# Patient Record
Sex: Male | Born: 1940 | Race: White | Hispanic: No | Marital: Married | Smoking: Never smoker
Health system: Southern US, Community
[De-identification: ages and names within clinical notes are randomized; demographics above are authoritative.]

## PROBLEM LIST (undated history)

## (undated) DIAGNOSIS — Z8719 Personal history of other diseases of the digestive system: Secondary | ICD-10-CM

## (undated) DIAGNOSIS — B958 Unspecified staphylococcus as the cause of diseases classified elsewhere: Secondary | ICD-10-CM

## (undated) DIAGNOSIS — H9190 Unspecified hearing loss, unspecified ear: Secondary | ICD-10-CM

## (undated) DIAGNOSIS — J189 Pneumonia, unspecified organism: Secondary | ICD-10-CM

## (undated) DIAGNOSIS — Z9289 Personal history of other medical treatment: Secondary | ICD-10-CM

## (undated) DIAGNOSIS — K648 Other hemorrhoids: Secondary | ICD-10-CM

## (undated) DIAGNOSIS — C4330 Malignant melanoma of unspecified part of face: Secondary | ICD-10-CM

## (undated) DIAGNOSIS — I219 Acute myocardial infarction, unspecified: Secondary | ICD-10-CM

## (undated) DIAGNOSIS — D649 Anemia, unspecified: Secondary | ICD-10-CM

## (undated) DIAGNOSIS — I209 Angina pectoris, unspecified: Secondary | ICD-10-CM

## (undated) DIAGNOSIS — E785 Hyperlipidemia, unspecified: Secondary | ICD-10-CM

## (undated) DIAGNOSIS — Z951 Presence of aortocoronary bypass graft: Secondary | ICD-10-CM

## (undated) DIAGNOSIS — I2510 Atherosclerotic heart disease of native coronary artery without angina pectoris: Secondary | ICD-10-CM

## (undated) DIAGNOSIS — M1990 Unspecified osteoarthritis, unspecified site: Secondary | ICD-10-CM

## (undated) HISTORY — PX: TONSILLECTOMY AND ADENOIDECTOMY: SUR1326

## (undated) HISTORY — PX: CATARACT EXTRACTION, BILATERAL: SHX1313

## (undated) HISTORY — DX: Presence of aortocoronary bypass graft: Z95.1

## (undated) HISTORY — PX: EYE SURGERY: SHX253

## (undated) HISTORY — DX: Hyperlipidemia, unspecified: E78.5

---

## 2007-06-17 DIAGNOSIS — C4330 Malignant melanoma of unspecified part of face: Secondary | ICD-10-CM

## 2007-06-17 HISTORY — PX: MELANOMA EXCISION: SHX5266

## 2007-06-17 HISTORY — DX: Malignant melanoma of unspecified part of face: C43.30

## 2009-09-28 ENCOUNTER — Ambulatory Visit (HOSPITAL_COMMUNITY): Admission: RE | Admit: 2009-09-28 | Discharge: 2009-09-28 | Payer: Self-pay | Admitting: Gastroenterology

## 2010-03-12 ENCOUNTER — Ambulatory Visit (HOSPITAL_COMMUNITY): Admission: RE | Admit: 2010-03-12 | Discharge: 2010-03-14 | Payer: Self-pay | Admitting: Gastroenterology

## 2010-03-13 ENCOUNTER — Encounter (INDEPENDENT_AMBULATORY_CARE_PROVIDER_SITE_OTHER): Payer: Self-pay | Admitting: Gastroenterology

## 2010-03-30 ENCOUNTER — Emergency Department (HOSPITAL_COMMUNITY): Admission: EM | Admit: 2010-03-30 | Discharge: 2010-03-30 | Payer: Self-pay | Admitting: Emergency Medicine

## 2010-06-16 DIAGNOSIS — K648 Other hemorrhoids: Secondary | ICD-10-CM

## 2010-06-16 DIAGNOSIS — Z9289 Personal history of other medical treatment: Secondary | ICD-10-CM

## 2010-06-16 HISTORY — DX: Personal history of other medical treatment: Z92.89

## 2010-06-16 HISTORY — DX: Other hemorrhoids: K64.8

## 2010-06-16 HISTORY — PX: EXCISIONAL HEMORRHOIDECTOMY: SHX1541

## 2010-08-28 LAB — POCT I-STAT, CHEM 8
BUN: 11 mg/dL (ref 6–23)
Calcium, Ion: 1.19 mmol/L (ref 1.12–1.32)
Chloride: 106 mEq/L (ref 96–112)
Creatinine, Ser: 0.7 mg/dL (ref 0.4–1.5)
Glucose, Bld: 105 mg/dL — ABNORMAL HIGH (ref 70–99)
HCT: 34 % — ABNORMAL LOW (ref 39.0–52.0)
Hemoglobin: 11.6 g/dL — ABNORMAL LOW (ref 13.0–17.0)
Potassium: 4 mEq/L (ref 3.5–5.1)
Sodium: 140 mEq/L (ref 135–145)
TCO2: 24 mmol/L (ref 0–100)

## 2010-08-29 LAB — CBC
HCT: 23.5 % — ABNORMAL LOW (ref 39.0–52.0)
HCT: 26.2 % — ABNORMAL LOW (ref 39.0–52.0)
HCT: 26.9 % — ABNORMAL LOW (ref 39.0–52.0)
Hemoglobin: 7.2 g/dL — ABNORMAL LOW (ref 13.0–17.0)
Hemoglobin: 8.1 g/dL — ABNORMAL LOW (ref 13.0–17.0)
Hemoglobin: 8.4 g/dL — ABNORMAL LOW (ref 13.0–17.0)
MCH: 19.4 pg — ABNORMAL LOW (ref 26.0–34.0)
MCH: 21.7 pg — ABNORMAL LOW (ref 26.0–34.0)
MCH: 21.9 pg — ABNORMAL LOW (ref 26.0–34.0)
MCHC: 30.6 g/dL (ref 30.0–36.0)
MCHC: 31.1 g/dL (ref 30.0–36.0)
MCHC: 31.1 g/dL (ref 30.0–36.0)
MCV: 63.3 fL — ABNORMAL LOW (ref 78.0–100.0)
MCV: 69.8 fL — ABNORMAL LOW (ref 78.0–100.0)
MCV: 70.4 fL — ABNORMAL LOW (ref 78.0–100.0)
Platelets: 323 10*3/uL (ref 150–400)
Platelets: 331 10*3/uL (ref 150–400)
Platelets: 428 10*3/uL — ABNORMAL HIGH (ref 150–400)
RBC: 3.72 MIL/uL — ABNORMAL LOW (ref 4.22–5.81)
RBC: 3.75 MIL/uL — ABNORMAL LOW (ref 4.22–5.81)
RBC: 3.83 MIL/uL — ABNORMAL LOW (ref 4.22–5.81)
RDW: 20.1 % — ABNORMAL HIGH (ref 11.5–15.5)
RDW: 25 % — ABNORMAL HIGH (ref 11.5–15.5)
RDW: 25.2 % — ABNORMAL HIGH (ref 11.5–15.5)
WBC: 5.8 10*3/uL (ref 4.0–10.5)
WBC: 6.9 10*3/uL (ref 4.0–10.5)
WBC: 9.2 10*3/uL (ref 4.0–10.5)

## 2010-08-29 LAB — TYPE AND SCREEN
ABO/RH(D): O POS
Antibody Screen: NEGATIVE

## 2010-08-29 LAB — BASIC METABOLIC PANEL
BUN: 11 mg/dL (ref 6–23)
CO2: 26 mEq/L (ref 19–32)
Calcium: 8.5 mg/dL (ref 8.4–10.5)
Chloride: 111 mEq/L (ref 96–112)
Creatinine, Ser: 0.82 mg/dL (ref 0.4–1.5)
GFR calc Af Amer: >60 mL/min (ref >60)
GFR calc non Af Amer: >60 mL/min (ref >60)
Glucose, Bld: 104 mg/dL — ABNORMAL HIGH (ref 70–99)
Potassium: 4.4 mEq/L (ref 3.5–5.1)
Sodium: 141 mEq/L (ref 135–145)

## 2010-08-29 LAB — HEMOGLOBIN AND HEMATOCRIT, BLOOD
HCT: 27.3 % — ABNORMAL LOW (ref 39.0–52.0)
HCT: 27.4 % — ABNORMAL LOW (ref 39.0–52.0)
Hemoglobin: 8.6 g/dL — ABNORMAL LOW (ref 13.0–17.0)
Hemoglobin: 8.7 g/dL — ABNORMAL LOW (ref 13.0–17.0)

## 2010-08-29 LAB — PREPARE RBC (CROSSMATCH)

## 2010-08-29 LAB — PROTIME-INR
INR: 1.2 (ref 0.00–1.49)
Prothrombin Time: 15.4 seconds — ABNORMAL HIGH (ref 11.6–15.2)

## 2010-09-03 LAB — DIFFERENTIAL
Basophils Absolute: 0 10*3/uL (ref 0.0–0.1)
Basophils Relative: 0 % (ref 0–1)
Eosinophils Absolute: 0 10*3/uL (ref 0.0–0.7)
Eosinophils Relative: 1 % (ref 0–5)
Lymphocytes Relative: 29 % (ref 12–46)
Lymphs Abs: 2 10*3/uL (ref 0.7–4.0)
Monocytes Absolute: 0.5 10*3/uL (ref 0.1–1.0)
Monocytes Relative: 8 % (ref 3–12)
Neutro Abs: 4.1 10*3/uL (ref 1.7–7.7)
Neutrophils Relative %: 62 % (ref 43–77)

## 2010-09-03 LAB — CBC
HCT: 27.5 % — ABNORMAL LOW (ref 39.0–52.0)
Hemoglobin: 8.6 g/dL — ABNORMAL LOW (ref 13.0–17.0)
MCHC: 31.2 g/dL (ref 30.0–36.0)
MCV: 72.3 fL — ABNORMAL LOW (ref 78.0–100.0)
Platelets: 355 10*3/uL (ref 150–400)
RBC: 3.8 MIL/uL — ABNORMAL LOW (ref 4.22–5.81)
RDW: 16.9 % — ABNORMAL HIGH (ref 11.5–15.5)
WBC: 6.7 10*3/uL (ref 4.0–10.5)

## 2010-09-03 LAB — TYPE AND SCREEN
ABO/RH(D): O POS
Antibody Screen: NEGATIVE

## 2010-09-03 LAB — ABO/RH: ABO/RH(D): O POS

## 2011-07-04 IMAGING — CR DG FOOT COMPLETE 3+V*L*
3 series · 3 of 3 positions shown · non-contrast
Comparison: None.

CLINICAL DATA: Intense left plantar foot and Achilles pain.

LEFT FOOT - COMPLETE 3+ VIEW

[view not recorded (1 of 3)]
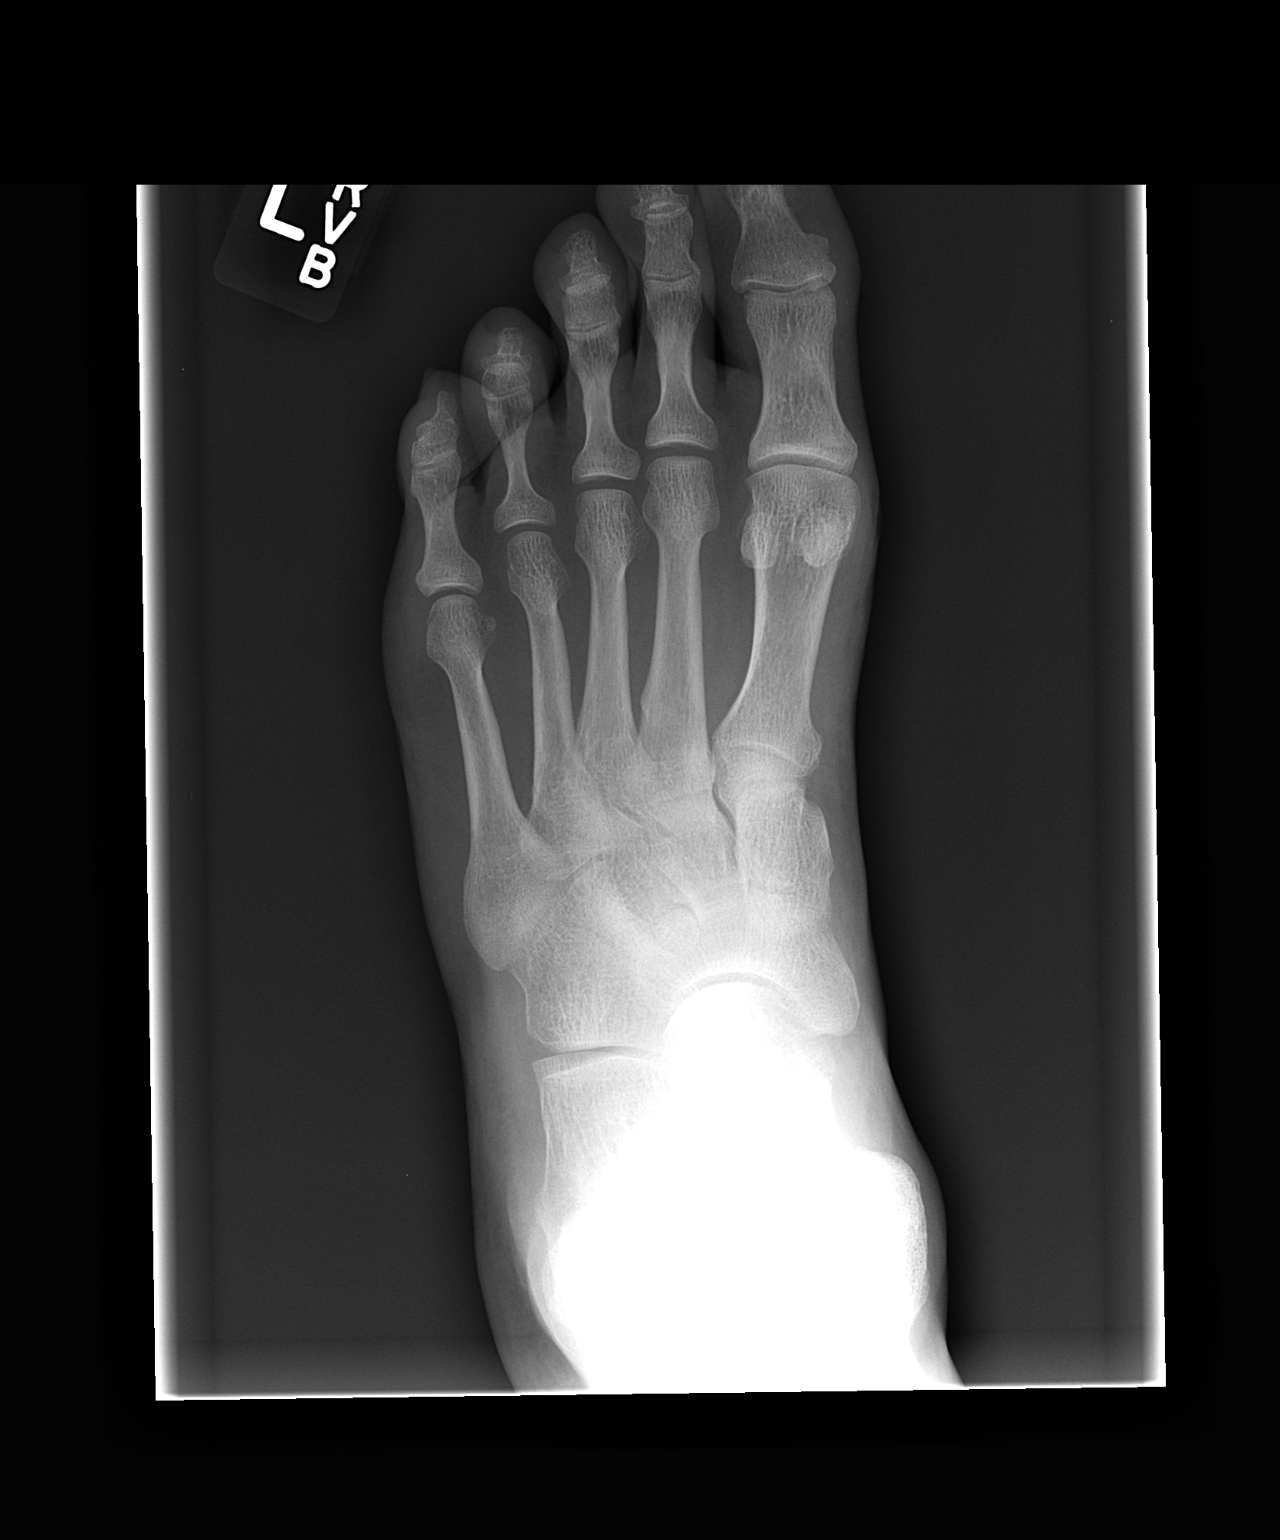

[view not recorded (2 of 3)]
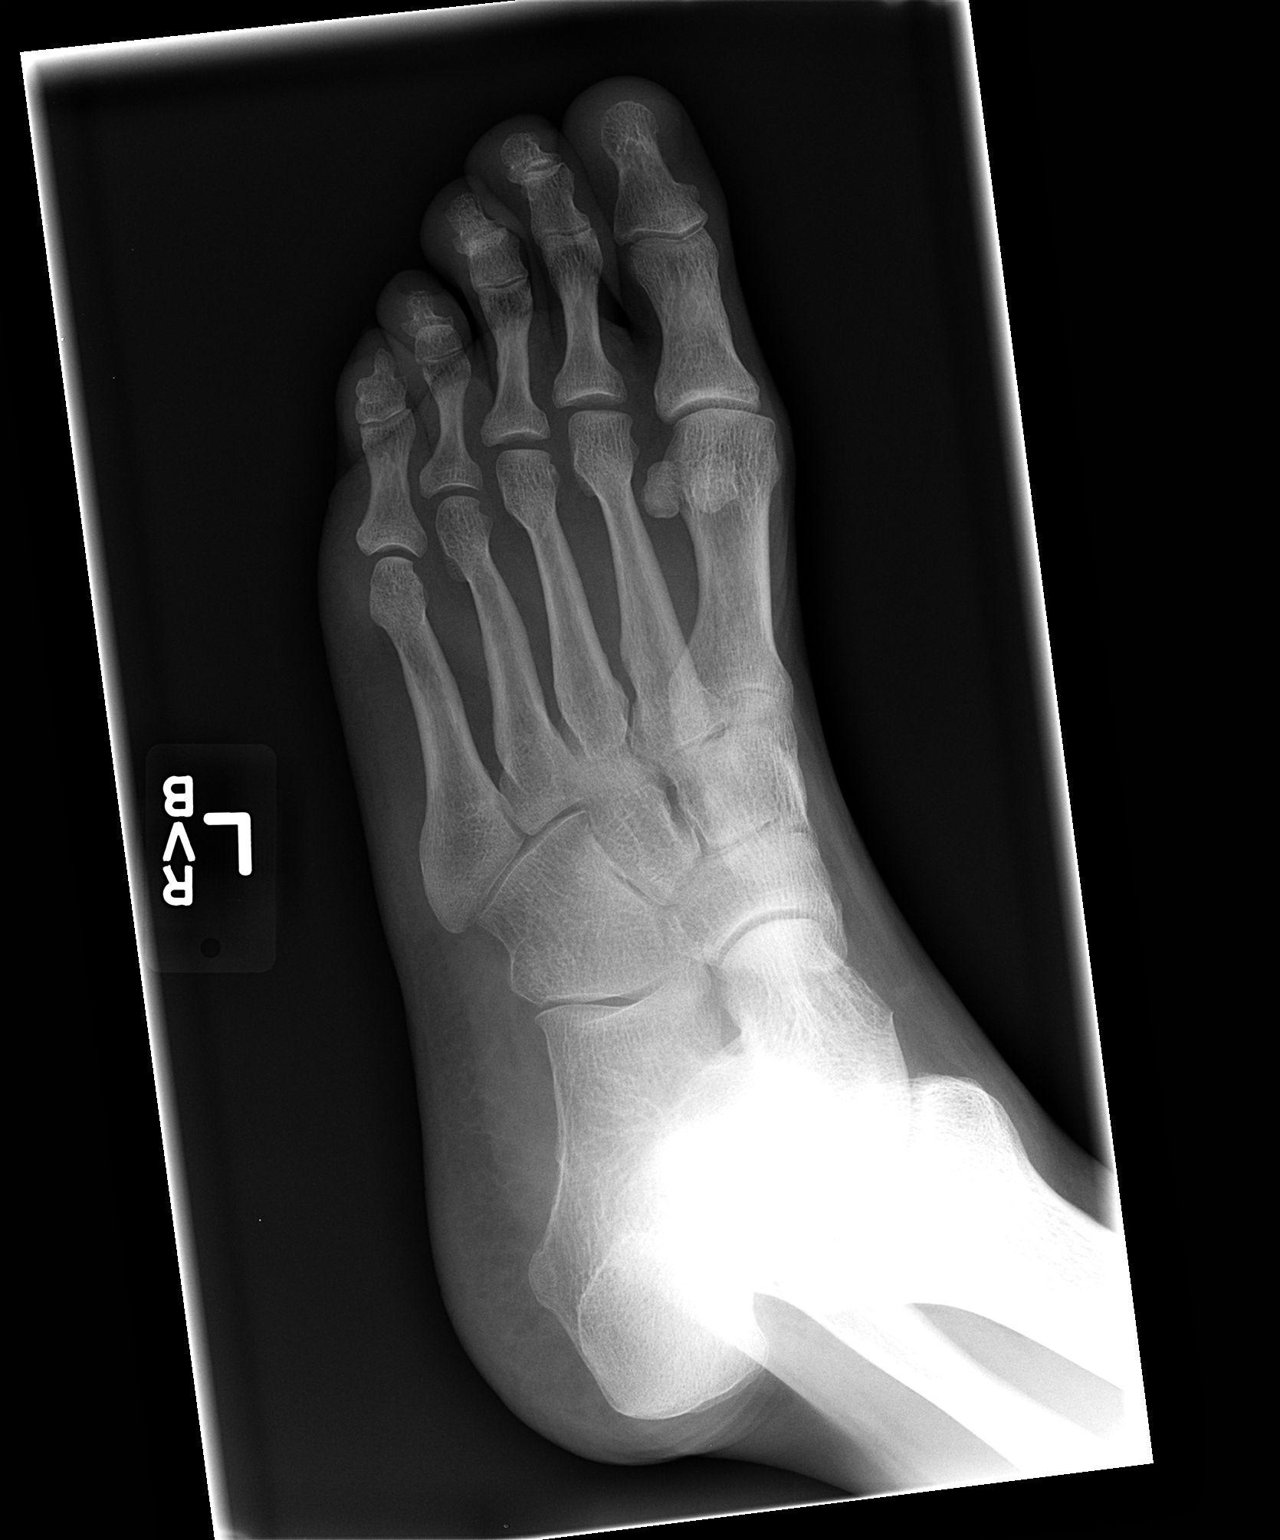

[view not recorded (3 of 3)]
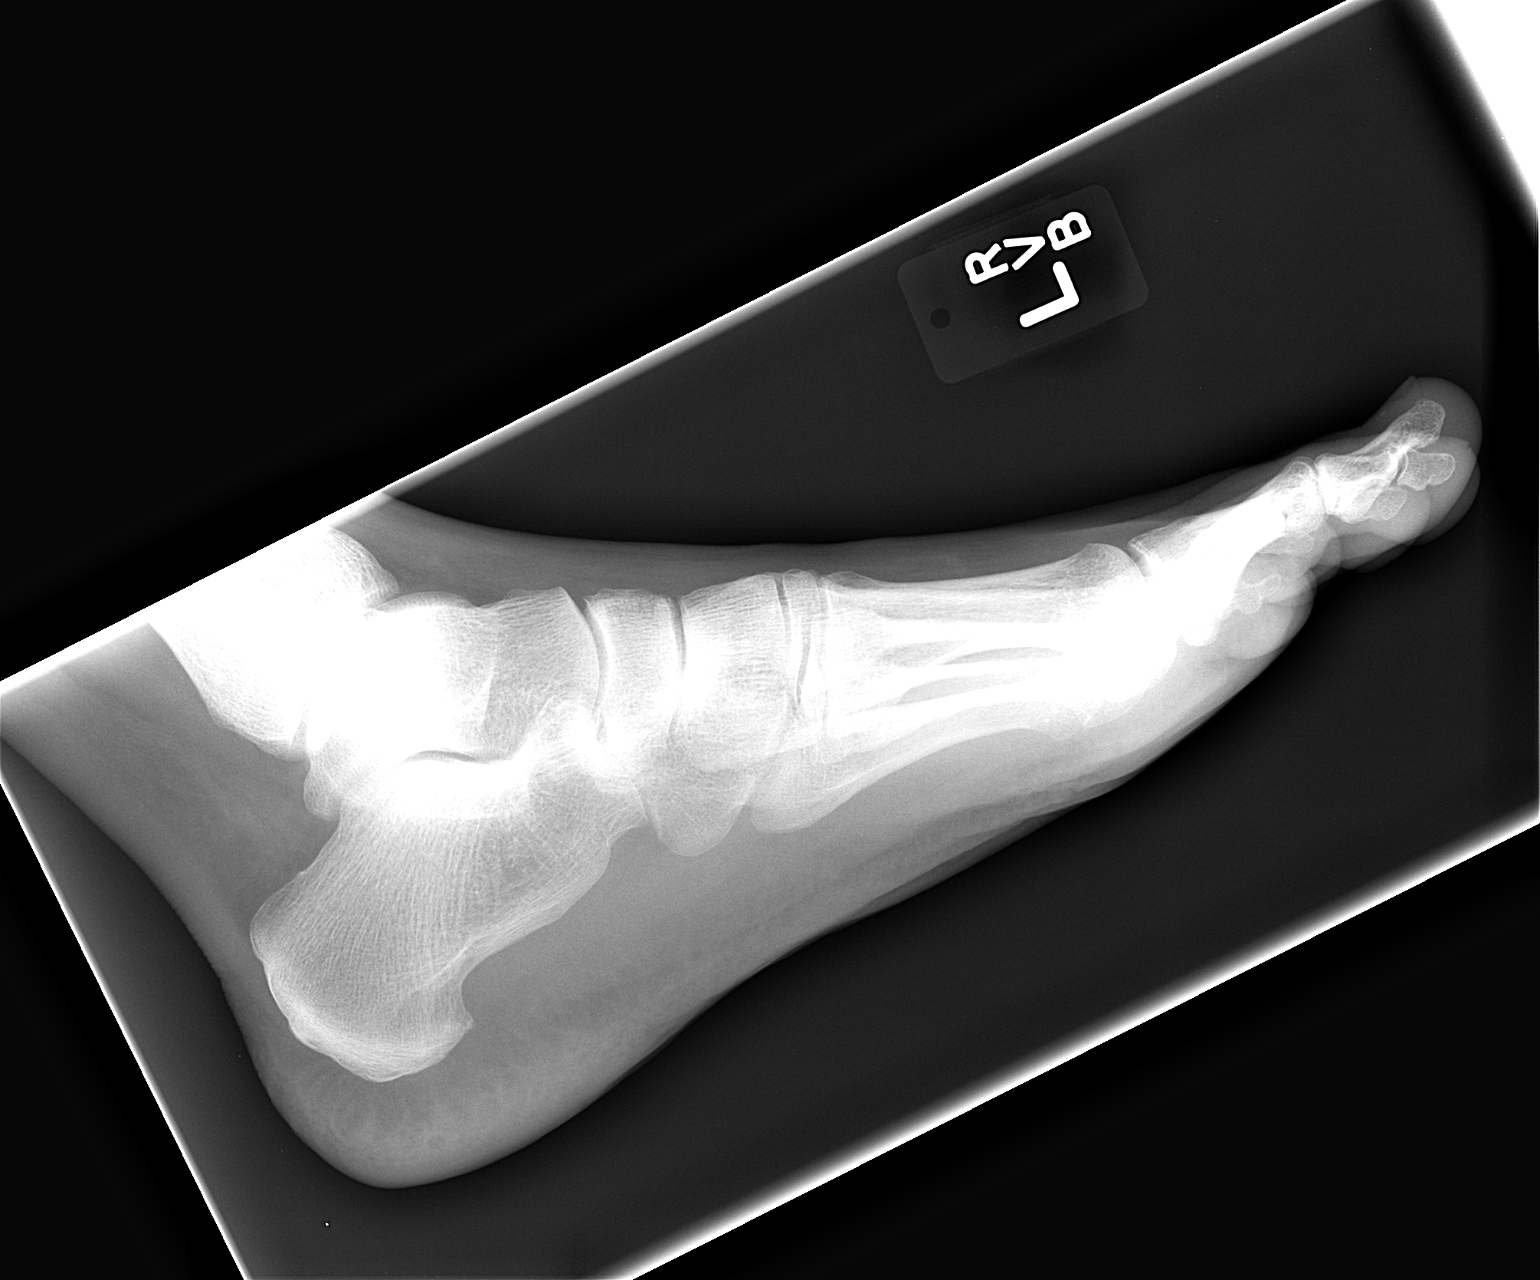

[3 of 3 positions shown; findings below may reference images not displayed]

FINDINGS: There is no evidence of fracture or dislocation.  The
joint spaces are preserved.  There is no evidence of talar
subluxation; the subtalar joint is unremarkable in appearance.

No significant soft tissue abnormalities are seen.
IMPRESSION: No evidence of fracture or dislocation.

## 2012-04-21 ENCOUNTER — Observation Stay (HOSPITAL_COMMUNITY)
Admission: EM | Admit: 2012-04-21 | Discharge: 2012-04-22 | Disposition: A | Discharge status: Home / Self Care | Payer: Medicare Other | Attending: Internal Medicine | Admitting: Internal Medicine

## 2012-04-21 ENCOUNTER — Emergency Department (HOSPITAL_COMMUNITY): Payer: Medicare Other

## 2012-04-21 ENCOUNTER — Encounter (HOSPITAL_COMMUNITY): Payer: Self-pay | Admitting: Emergency Medicine

## 2012-04-21 DIAGNOSIS — D649 Anemia, unspecified: Secondary | ICD-10-CM

## 2012-04-21 DIAGNOSIS — I219 Acute myocardial infarction, unspecified: Secondary | ICD-10-CM

## 2012-04-21 DIAGNOSIS — R079 Chest pain, unspecified: Principal | ICD-10-CM | POA: Insufficient documentation

## 2012-04-21 DIAGNOSIS — Z85820 Personal history of malignant melanoma of skin: Secondary | ICD-10-CM | POA: Insufficient documentation

## 2012-04-21 DIAGNOSIS — I209 Angina pectoris, unspecified: Secondary | ICD-10-CM

## 2012-04-21 DIAGNOSIS — D509 Iron deficiency anemia, unspecified: Secondary | ICD-10-CM | POA: Insufficient documentation

## 2012-04-21 HISTORY — PX: CARDIAC CATHETERIZATION: SHX172

## 2012-04-21 HISTORY — DX: Acute myocardial infarction, unspecified: I21.9

## 2012-04-21 HISTORY — DX: Angina pectoris, unspecified: I20.9

## 2012-04-21 LAB — CBC WITH DIFFERENTIAL/PLATELET
Basophils Absolute: 0 10*3/uL (ref 0.0–0.1)
Eosinophils Absolute: 0.2 10*3/uL (ref 0.0–0.7)
Eosinophils Relative: 2 % (ref 0–5)
HCT: 35.2 % — ABNORMAL LOW (ref 39.0–52.0)
Hemoglobin: 11.1 g/dL — ABNORMAL LOW (ref 13.0–17.0)
Lymphocytes Relative: 42 % (ref 12–46)
Lymphs Abs: 4.1 10*3/uL — ABNORMAL HIGH (ref 0.7–4.0)
MCH: 23.5 pg — ABNORMAL LOW (ref 26.0–34.0)
MCHC: 31.5 g/dL (ref 30.0–36.0)
MCV: 74.4 fL — ABNORMAL LOW (ref 78.0–100.0)
Monocytes Absolute: 0.9 10*3/uL (ref 0.1–1.0)
Monocytes Relative: 10 % (ref 3–12)
Neutro Abs: 4.6 10*3/uL (ref 1.7–7.7)
Neutrophils Relative %: 47 % (ref 43–77)
Platelets: 330 10*3/uL (ref 150–400)
RBC: 4.73 MIL/uL (ref 4.22–5.81)
RDW: 16.6 % — ABNORMAL HIGH (ref 11.5–15.5)
WBC: 9.9 10*3/uL (ref 4.0–10.5)

## 2012-04-21 LAB — COMPREHENSIVE METABOLIC PANEL
ALT: 37 U/L (ref 0–53)
AST: 36 U/L (ref 0–37)
Alkaline Phosphatase: 100 U/L (ref 39–117)
BUN: 14 mg/dL (ref 6–23)
CO2: 24 mEq/L (ref 19–32)
Calcium: 9.4 mg/dL (ref 8.4–10.5)
Chloride: 100 mEq/L (ref 96–112)
Creatinine, Ser: 0.89 mg/dL (ref 0.50–1.35)
GFR calc Af Amer: >90 mL/min (ref >90)
GFR calc non Af Amer: 85 mL/min — ABNORMAL LOW (ref >90)
Glucose, Bld: 123 mg/dL — ABNORMAL HIGH (ref 70–99)
Potassium: 3.8 mEq/L (ref 3.5–5.1)
Sodium: 139 mEq/L (ref 135–145)
Total Bilirubin: 0.2 mg/dL — ABNORMAL LOW (ref 0.3–1.2)
Total Protein: 7.1 g/dL (ref 6.0–8.3)

## 2012-04-21 LAB — POCT I-STAT TROPONIN I: Troponin i, poc: 0.02 ng/mL (ref 0.00–0.08)

## 2012-04-21 MED ORDER — ASPIRIN 81 MG PO CHEW
324.0000 mg | CHEWABLE_TABLET | Freq: Once | ORAL | Status: AC
  Administered 2012-04-21: 324 mg via ORAL
  Filled 2012-04-21: qty 4

## 2012-04-21 MED ORDER — NITROGLYCERIN 0.4 MG SL SUBL
SUBLINGUAL_TABLET | SUBLINGUAL | Status: AC
  Filled 2012-04-21: qty 25

## 2012-04-21 MED ORDER — NITROGLYCERIN 0.4 MG SL SUBL
0.4000 mg | SUBLINGUAL_TABLET | SUBLINGUAL | Status: DC | PRN
  Administered 2012-04-21: 23:00:00 via SUBLINGUAL

## 2012-04-21 NOTE — ED Provider Notes (Signed)
History     CSN: [id number]  Arrival date & time 04/21/12  2046   First MD Initiated Contact with Patient 04/21/12 2101      Chief Complaint  Patient presents with  . Chest Pain    (Consider location/radiation/quality/duration/timing/severity/associated sxs/prior treatment) HPI Patient developed anterior chest pressure 8 PM tonight accompanied by nausea shortness of breath and sweatiness. Symptoms improved spontaneously without treatment. Nothing makes symptoms better or worse. Symptoms onset at rest. No other associated symptoms. Symptoms almost gone at present. He denies shortness of breath nausea or sweatiness presently complains of minimal chest discomfort described as a pressure, nonradiating No past medical history on file. Melanoma Past Surgical History  Procedure Date  . Anus surgery     hemmrhoid removal  . Melanoma removal     on L side of head    No family history on file.  History  Substance Use Topics  . Smoking status: Never Smoker   . Smokeless tobacco: Not on file  . Alcohol Use: No   no illicit drug use    Review of Systems  Constitutional: Negative.   HENT: Negative.   Respiratory: Positive for shortness of breath.   Cardiovascular: Positive for chest pain.       Diaphoresis  Gastrointestinal: Positive for nausea.  Musculoskeletal: Negative.   Skin: Negative.   Neurological: Negative.   Hematological: Negative.   Psychiatric/Behavioral: Negative.   All other systems reviewed and are negative.    Allergies  Review of patient's allergies indicates no known allergies.  Home Medications   Current Outpatient Rx  Name  Route  Sig  Dispense  Refill  . COENZYME Q10 30 MG PO CAPS   Oral   Take 30 mg by mouth daily.         . ADULT MULTIVITAMIN W/MINERALS CH   Oral   Take 1 tablet by mouth daily.           BP 124/83  Pulse 77  Temp 97.4 F (36.3 C) (Oral)  Resp 22  SpO2 100%  Physical Exam  Nursing note and vitals  reviewed. Constitutional: He appears well-developed and well-nourished.  HENT:  Head: Normocephalic and atraumatic.  Eyes: Conjunctivae normal are normal. Pupils are equal, round, and reactive to light.  Neck: Neck supple. No tracheal deviation present. No thyromegaly present.  Cardiovascular: Normal rate and regular rhythm.   No murmur heard. Pulmonary/Chest: Effort normal and breath sounds normal.  Abdominal: Soft. Bowel sounds are normal. He exhibits no distension. There is no tenderness.  Musculoskeletal: Normal range of motion. He exhibits no edema and no tenderness.  Neurological: He is alert. Coordination normal.  Skin: Skin is warm and dry. No rash noted.  Psychiatric: He has a normal mood and affect.    ED Course  Procedures (including critical care time) 11:10 PM patient complains complain of mild anterior chest pressure. Sublingual nitroglycerin ordered.  Labs Reviewed  CBC WITH DIFFERENTIAL - Abnormal; Notable for the following:    Hemoglobin 11.1 (*)     HCT 35.2 (*)     MCV 74.4 (*)     MCH 23.5 (*)     RDW 16.6 (*)     Lymphs Abs 4.1 (*)     All other components within normal limits  COMPREHENSIVE METABOLIC PANEL - Abnormal; Notable for the following:    Glucose, Bld 123 (*)     Total Bilirubin 0.2 (*)     GFR calc non Af Amer 85 (*)  All other components within normal limits  POCT I-STAT TROPONIN I   No results found.   No diagnosis found.   Date: 04/21/2012  Rate: 75  Rhythm: normal sinus rhythm  QRS Axis: left  Intervals: normal  ST/T Wave abnormalities: nonspecific T wave changes  Conduction Disutrbances:none  Narrative Interpretation:   Old EKG Reviewed: No significant change from 03/13/2010 interpreted by me Results for orders placed during the hospital encounter of 04/21/12  CBC WITH DIFFERENTIAL      Component Value Range   WBC 9.9  4.0 - 10.5 K/uL   RBC 4.73  4.22 - 5.81 MIL/uL   Hemoglobin 11.1 (*) 13.0 - 17.0 g/dL   HCT 47.8 (*) 29.5  - 52.0 %   MCV 74.4 (*) 78.0 - 100.0 fL   MCH 23.5 (*) 26.0 - 34.0 pg   MCHC 31.5  30.0 - 36.0 g/dL   RDW 62.1 (*) 30.8 - 65.7 %   Platelets 330  150 - 400 K/uL   Neutrophils Relative 47  43 - 77 %   Neutro Abs 4.6  1.7 - 7.7 K/uL   Lymphocytes Relative 42  12 - 46 %   Lymphs Abs 4.1 (*) 0.7 - 4.0 K/uL   Monocytes Relative 10  3 - 12 %   Monocytes Absolute 0.9  0.1 - 1.0 K/uL   Eosinophils Relative 2  0 - 5 %   Eosinophils Absolute 0.2  0.0 - 0.7 K/uL   Basophils Relative 0  0 - 1 %   Basophils Absolute 0.0  0.0 - 0.1 K/uL  COMPREHENSIVE METABOLIC PANEL      Component Value Range   Sodium 139  135 - 145 mEq/L   Potassium 3.8  3.5 - 5.1 mEq/L   Chloride 100  96 - 112 mEq/L   CO2 24  19 - 32 mEq/L   Glucose, Bld 123 (*) 70 - 99 mg/dL   BUN 14  6 - 23 mg/dL   Creatinine, Ser 8.46  0.50 - 1.35 mg/dL   Calcium 9.4  8.4 - 96.2 mg/dL   Total Protein 7.1  6.0 - 8.3 g/dL   Albumin 3.9  3.5 - 5.2 g/dL   AST 36  0 - 37 U/L   ALT 37  0 - 53 U/L   Alkaline Phosphatase 100  39 - 117 U/L   Total Bilirubin 0.2 (*) 0.3 - 1.2 mg/dL   GFR calc non Af Amer 85 (*) >90 mL/min   GFR calc Af Amer >90  >90 mL/min  POCT I-STAT TROPONIN I      Component Value Range   Troponin i, poc 0.02  0.00 - 0.08 ng/mL   Comment 3            Dg Chest 2 View  04/21/2012  *RADIOLOGY REPORT*  Clinical Data: - Chest pain.  Syncope.  CHEST - 2 VIEW  Comparison: None.  Findings: Cardiomediastinal silhouette unremarkable.   Lungs clear. Bronchovascular markings normal.  Pulmonary vascularity normal.  No pneumothorax.  No pleural effusions.  Degenerative changes and DISH involving the thoracic spine.  IMPRESSION: No acute cardiopulmonary disease.   Original Report Authenticated By: Hulan Saas, M.D.     Chest x-ray reviewed by me  MDM  1110Dr Toniann Fail called to evaluate patient for admission or 23 hour observation Diagnosis chest pain        Doug Sou, MD 04/21/12 2315

## 2012-04-21 NOTE — ED Notes (Signed)
Pt was asleep at home when he woke up with chest tightness and SOB. 100% ra

## 2012-04-22 ENCOUNTER — Encounter (HOSPITAL_COMMUNITY)
Admission: EM | Disposition: A | Discharge status: Home / Self Care | Payer: Self-pay | Source: Home / Self Care | Attending: Emergency Medicine

## 2012-04-22 ENCOUNTER — Encounter (HOSPITAL_COMMUNITY): Payer: Self-pay | Admitting: Internal Medicine

## 2012-04-22 DIAGNOSIS — D509 Iron deficiency anemia, unspecified: Secondary | ICD-10-CM | POA: Diagnosis present

## 2012-04-22 DIAGNOSIS — R079 Chest pain, unspecified: Secondary | ICD-10-CM

## 2012-04-22 DIAGNOSIS — D649 Anemia, unspecified: Secondary | ICD-10-CM

## 2012-04-22 LAB — CBC WITH DIFFERENTIAL/PLATELET
Basophils Relative: 0 % (ref 0–1)
Eosinophils Absolute: 0 10*3/uL (ref 0.0–0.7)
Eosinophils Relative: 1 % (ref 0–5)
Lymphs Abs: 2.2 10*3/uL (ref 0.7–4.0)
MCH: 22.5 pg — ABNORMAL LOW (ref 26.0–34.0)
MCHC: 29.8 g/dL — ABNORMAL LOW (ref 30.0–36.0)
MCV: 75.5 fL — ABNORMAL LOW (ref 78.0–100.0)
Platelets: 297 10*3/uL (ref 150–400)
RBC: 4.58 MIL/uL (ref 4.22–5.81)

## 2012-04-22 LAB — COMPREHENSIVE METABOLIC PANEL
ALT: 32 U/L (ref 0–53)
AST: 27 U/L (ref 0–37)
Albumin: 3.4 g/dL — ABNORMAL LOW (ref 3.5–5.2)
Alkaline Phosphatase: 89 U/L (ref 39–117)
CO2: 27 mEq/L (ref 19–32)
Chloride: 102 mEq/L (ref 96–112)
GFR calc non Af Amer: 86 mL/min — ABNORMAL LOW (ref >90)
Potassium: 4 mEq/L (ref 3.5–5.1)
Sodium: 138 mEq/L (ref 135–145)
Total Bilirubin: 0.3 mg/dL (ref 0.3–1.2)

## 2012-04-22 LAB — GLUCOSE, CAPILLARY: Glucose-Capillary: 83 mg/dL (ref 70–99)

## 2012-04-22 LAB — TROPONIN I
Troponin I: <0.3 ng/mL (ref <0.30)
Troponin I: <0.3 ng/mL (ref <0.30)

## 2012-04-22 LAB — VITAMIN B12: Vitamin B-12: 756 pg/mL (ref 211–911)

## 2012-04-22 LAB — IRON AND TIBC
Iron: 16 ug/dL — ABNORMAL LOW (ref 42–135)
Saturation Ratios: 4 % — ABNORMAL LOW (ref 20–55)
UIBC: 363 ug/dL (ref 125–400)

## 2012-04-22 LAB — LIPID PANEL
Cholesterol: 166 mg/dL (ref 0–200)
Total CHOL/HDL Ratio: 3.6 RATIO
Triglycerides: 103 mg/dL (ref <150)

## 2012-04-22 LAB — FERRITIN: Ferritin: 6 ng/mL — ABNORMAL LOW (ref 22–322)

## 2012-04-22 LAB — TSH: TSH: 3.011 u[IU]/mL (ref 0.350–4.500)

## 2012-04-22 SURGERY — LEFT HEART CATHETERIZATION WITH CORONARY ANGIOGRAM
Anesthesia: LOCAL

## 2012-04-22 MED ORDER — SODIUM CHLORIDE 0.9 % IV SOLN
INTRAVENOUS | Status: DC
  Administered 2012-04-22: 01:00:00 via INTRAVENOUS

## 2012-04-22 MED ORDER — ONDANSETRON HCL 4 MG PO TABS: 4.0000 mg | ORAL_TABLET | Freq: Four times a day (QID) | ORAL | Status: DC | PRN

## 2012-04-22 MED ORDER — BIOTENE DRY MOUTH MT LIQD
15.0000 mL | Freq: Two times a day (BID) | OROMUCOSAL | Status: DC
  Administered 2012-04-22: 15 mL via OROMUCOSAL

## 2012-04-22 MED ORDER — MORPHINE SULFATE 2 MG/ML IJ SOLN: 1.0000 mg | INTRAMUSCULAR | Status: DC | PRN

## 2012-04-22 MED ORDER — ASPIRIN EC 325 MG PO TBEC
325.0000 mg | DELAYED_RELEASE_TABLET | Freq: Every day | ORAL | Status: DC
  Administered 2012-04-22: 325 mg via ORAL
  Filled 2012-04-22: qty 1

## 2012-04-22 MED ORDER — SODIUM CHLORIDE 0.9 % IJ SOLN: 3.0000 mL | Freq: Two times a day (BID) | INTRAMUSCULAR | Status: DC

## 2012-04-22 MED ORDER — NITROGLYCERIN IN D5W 200-5 MCG/ML-% IV SOLN
2.0000 ug/min | INTRAVENOUS | Status: DC
  Administered 2012-04-22: 5 ug/min via INTRAVENOUS
  Filled 2012-04-22: qty 250

## 2012-04-22 MED ORDER — ENOXAPARIN SODIUM 40 MG/0.4ML ~~LOC~~ SOLN
40.0000 mg | SUBCUTANEOUS | Status: DC
  Administered 2012-04-22: 40 mg via SUBCUTANEOUS
  Filled 2012-04-22: qty 0.4

## 2012-04-22 MED ORDER — FERROUS SULFATE 325 (65 FE) MG PO TABS: 325.0000 mg | ORAL_TABLET | Freq: Two times a day (BID) | ORAL | Status: DC

## 2012-04-22 MED ORDER — FERROUS SULFATE 325 (65 FE) MG PO TABS
325.0000 mg | ORAL_TABLET | Freq: Two times a day (BID) | ORAL | Status: DC
  Filled 2012-04-22 (×2): qty 1

## 2012-04-22 MED ORDER — ACETAMINOPHEN 325 MG PO TABS: 650.0000 mg | ORAL_TABLET | Freq: Four times a day (QID) | ORAL | Status: DC | PRN

## 2012-04-22 MED ORDER — ATORVASTATIN CALCIUM 20 MG PO TABS
20.0000 mg | ORAL_TABLET | Freq: Every day | ORAL | Status: DC
  Filled 2012-04-22: qty 1

## 2012-04-22 MED ORDER — ACETAMINOPHEN 650 MG RE SUPP: 650.0000 mg | Freq: Four times a day (QID) | RECTAL | Status: DC | PRN

## 2012-04-22 MED ORDER — ONDANSETRON HCL 4 MG/2ML IJ SOLN: 4.0000 mg | Freq: Four times a day (QID) | INTRAMUSCULAR | Status: DC | PRN

## 2012-04-22 MED ORDER — ASPIRIN EC 81 MG PO TBEC: 81.0000 mg | DELAYED_RELEASE_TABLET | Freq: Every day | ORAL | Status: DC

## 2012-04-22 NOTE — Consult Note (Addendum)
Admit date: 04/21/2012 Referring Physician  Dr. Ardyth Harps, [facility] Primary Physician  Lupe Carney, MD Primary Cardiologist  HWBSmith, III Reason for Consultation:  Chest pain  ASSESSMENT: 1. Prolonged chest pain with MI ruled out. History is concerning, but there are no high risk indicators(- for DM, Htn, tobacco, family history, etc.). 2. History of peptic ulcer disease. 3. Hemorrhoids 4.Scalp melenoma  PLAN:  1. Offerred coronary angiography but decided in favor of stress cardiolite as an OP in AM at [facility] cardiology.  2. Asprin 81 mg daily  3. Return to the hospital if recurrent chest pain.   HPI: Very nice 71 yo with a history of colon polyps, hemorrhoids and peptic ulcer disease was admitted with chest pressure starting at rest. He mowed his lawn yesterday afternoon. No symptoms during the physical activity. The discomfort was new and never previously experienced. Feels well now. Markers and ECG'sare stable. He has been able to ambulate briskly before and after eating without recurrence of chest discomfort, dyspnea, or any cardiac complaints.    PMH:  History reviewed. No pertinent past medical history.   PSH:   Past Surgical History  Procedure Date  . Anus surgery     hemmrhoid removal  . Melanoma removal     on L side of head    Allergies:  Review of patient's allergies indicates no known allergies. Prior to Admit Meds:   Prescriptions prior to admission  Medication Sig Dispense Refill  . co-enzyme Q-10 30 MG capsule Take 30 mg by mouth daily.      . Multiple Vitamin (MULTIVITAMIN WITH MINERALS) TABS Take 1 tablet by mouth daily.       Fam HX:    Family History  Problem Relation Age of Onset  . Diabetes Mellitus II Mother    Social HX:    History   Social History  . Marital Status: Married    Spouse Name: N/A    Number of Children: N/A  . Years of Education: N/A   Occupational History  . Not on file.   Social History Main Topics  .  Smoking status: Never Smoker   . Smokeless tobacco: Not on file  . Alcohol Use: No  . Drug Use: No  . Sexually Active:    Other Topics Concern  . Not on file   Social History Narrative  . No narrative on file     Review of Systems: Physically very active and no history of exertional intolerance or claudication.  Physical Exam: Blood pressure 137/74, pulse 73, temperature 97.8 F (36.6 C), temperature source Oral, resp. rate 20, height 5\' 6"  (1.676 m), weight 70.1 kg (154 lb 8.7 oz), SpO2 98.00%. Weight change:   The patient is normal in appearance. He has no obvious distress. Color is good.  Neck exam reveals no carotid bruits or JVD.  Lungs are clear to auscultation and percussion.  Cardiac exam is normal. No murmur rub or gallop is heard.  Abdomen soft. No tenderness or hepatomegaaly is noted.  Lower reveal no edema. Pulses 2+ and symmetric. Posterior tibial radial and femoral pulses are symmetric.  Neurological exam is unremarkable. No motor sensory deficits noted.  Labs:   Lab Results  Component Value Date   WBC 7.7 04/22/2012   HGB 10.3* 04/22/2012   HCT 34.6* 04/22/2012   MCV 75.5* 04/22/2012   PLT 297 04/22/2012    Lab 04/22/12 0220  NA 138  K 4.0  CL 102  CO2 27  BUN 13  CREATININE 0.86  CALCIUM 9.0  PROT 6.3  BILITOT 0.3  ALKPHOS 89  ALT 32  AST 27  GLUCOSE 102*   No results found for this basename: PTT   Lab Results  Component Value Date   INR 1.20 03/12/2010   Lab Results  Component Value Date   TROPONINI <0.30 04/22/2012     Lab Results  Component Value Date   CHOL 166 04/22/2012   Lab Results  Component Value Date   HDL 46 04/22/2012   Lab Results  Component Value Date   LDLCALC 99 04/22/2012   Lab Results  Component Value Date   TRIG 103 04/22/2012   Lab Results  Component Value Date   CHOLHDL 3.6 04/22/2012   No results found for this basename: LDLDIRECT      Radiology:  Dg Chest 2 View  04/21/2012  *RADIOLOGY REPORT*   Clinical Data: - Chest pain.  Syncope.  CHEST - 2 VIEW  Comparison: None.  Findings: Cardiomediastinal silhouette unremarkable.   Lungs clear. Bronchovascular markings normal.  Pulmonary vascularity normal.  No pneumothorax.  No pleural effusions.  Degenerative changes and DISH involving the thoracic spine.  IMPRESSION: No acute cardiopulmonary disease.   Original Report Authenticated By: Hulan Saas, M.D.    EKG:  No significant abnormalities are noted on serial tracings    Lesleigh Noe 04/22/2012 1:55 PM   ADDENDUM: His insurance company, [employer], would not certify an OP nuclear stress test. He will have a standard ETT tomorrow in our office at 11:15 AM. He wants to be discharged from the hospital.

## 2012-04-22 NOTE — Care Management Note (Signed)
    Page 1 of 1   04/23/2012     10:03:40 AM   CARE MANAGEMENT NOTE 04/23/2012  Patient:  Keith Cunningham, Keith Cunningham   Account Number:  [id number]  Date Initiated:  04/22/2012  Documentation initiated by:  Lanier Clam  Subjective/Objective Assessment:   ADMITTED W/CHEST PAIN.ZO:[id number]     Action/Plan:   FROM HOME W/SPOUSE.   Anticipated DC Date:  04/23/2012   Anticipated DC Plan:  HOME/SELF CARE      DC Planning Services  CM consult      Choice offered to / List presented to:             Status of service:  Completed, signed off Medicare Important Message given?   (If response is "NO", the following Medicare IM given date fields will be blank) Date Medicare IM given:   Date Additional Medicare IM given:    Discharge Disposition:  HOME/SELF CARE  Per UR Regulation:  Reviewed for med. necessity/level of care/duration of stay  If discussed at Long Length of Stay Meetings, dates discussed:    Comments:  04/22/12 [facility] Jamiel Goncalves RN,BSN NCM [phone]

## 2012-04-22 NOTE — H&P (Signed)
Keith Cunningham is an 71 y.o. male.   Patient was seen and examined on April 22, 2012. PCP - Dr. Theressa Millard. Chief Complaint: Chest pain. HPI: 71 year-old male with history of melanoma started developing some chest pressure last evening around 8 PM while at home. Patient initially felt dizzy and short of breath after which he went to the bathroom to have a bowel movement after which he started developing chest pressure felt nauseated and had diaphoresis. The chest pressure persisted and he decided to come to the ER. He was brought to the ER by his wife in a car probably patient threw up once. In the ER EKG and cardiac enzymes did not show any acute. Chest x-ray was unremarkable. Patient's chest pain improved with nitroglycerin sublingual was still persist and has been admitted for further management. Patient denies any fever chills or any productive cough.  History reviewed. No pertinent past medical history.  Past Surgical History  Procedure Date  . Anus surgery     hemmrhoid removal  . Melanoma removal     on L side of head    Family History  Problem Relation Age of Onset  . Diabetes Mellitus II Mother    Social History:  reports that he has never smoked. He does not have any smokeless tobacco history on file. He reports that he does not drink alcohol or use illicit drugs.  Allergies: No Known Allergies   (Not in a hospital admission)  Results for orders placed during the hospital encounter of 04/21/12 (from the past 48 hour(s))  CBC WITH DIFFERENTIAL     Status: Abnormal   Collection Time   04/21/12  8:50 PM      Component Value Range Comment   WBC 9.9  4.0 - 10.5 K/uL    RBC 4.73  4.22 - 5.81 MIL/uL    Hemoglobin 11.1 (*) 13.0 - 17.0 g/dL    HCT 62.9 (*) 52.8 - 52.0 %    MCV 74.4 (*) 78.0 - 100.0 fL    MCH 23.5 (*) 26.0 - 34.0 pg    MCHC 31.5  30.0 - 36.0 g/dL    RDW 41.3 (*) 24.4 - 15.5 %    Platelets 330  150 - 400 K/uL    Neutrophils Relative 47  43 - 77 %    Neutro Abs 4.6  1.7 - 7.7 K/uL    Lymphocytes Relative 42  12 - 46 %    Lymphs Abs 4.1 (*) 0.7 - 4.0 K/uL    Monocytes Relative 10  3 - 12 %    Monocytes Absolute 0.9  0.1 - 1.0 K/uL    Eosinophils Relative 2  0 - 5 %    Eosinophils Absolute 0.2  0.0 - 0.7 K/uL    Basophils Relative 0  0 - 1 %    Basophils Absolute 0.0  0.0 - 0.1 K/uL   COMPREHENSIVE METABOLIC PANEL     Status: Abnormal   Collection Time   04/21/12  8:50 PM      Component Value Range Comment   Sodium 139  135 - 145 mEq/L    Potassium 3.8  3.5 - 5.1 mEq/L    Chloride 100  96 - 112 mEq/L    CO2 24  19 - 32 mEq/L    Glucose, Bld 123 (*) 70 - 99 mg/dL    BUN 14  6 - 23 mg/dL    Creatinine, Ser 0.10  0.50 - 1.35 mg/dL  Calcium 9.4  8.4 - 10.5 mg/dL    Total Protein 7.1  6.0 - 8.3 g/dL    Albumin 3.9  3.5 - 5.2 g/dL    AST 36  0 - 37 U/L    ALT 37  0 - 53 U/L    Alkaline Phosphatase 100  39 - 117 U/L    Total Bilirubin 0.2 (*) 0.3 - 1.2 mg/dL    GFR calc non Af Amer 85 (*) >90 mL/min    GFR calc Af Amer >90  >90 mL/min   POCT I-STAT TROPONIN I     Status: Normal   Collection Time   04/21/12  9:02 PM      Component Value Range Comment   Troponin i, poc 0.02  0.00 - 0.08 ng/mL    Comment 3             Dg Chest 2 View  04/21/2012  *RADIOLOGY REPORT*  Clinical Data: - Chest pain.  Syncope.  CHEST - 2 VIEW  Comparison: None.  Findings: Cardiomediastinal silhouette unremarkable.   Lungs clear. Bronchovascular markings normal.  Pulmonary vascularity normal.  No pneumothorax.  No pleural effusions.  Degenerative changes and DISH involving the thoracic spine.  IMPRESSION: No acute cardiopulmonary disease.   Original Report Authenticated By: Hulan Saas, M.D.     Review of Systems  Constitutional: Negative.   HENT: Negative.   Eyes: Negative.   Respiratory: Negative.   Cardiovascular: Positive for chest pain.  Gastrointestinal: Positive for nausea.  Genitourinary: Negative.   Musculoskeletal: Negative.   Skin:  Negative.   Neurological: Negative.   Endo/Heme/Allergies: Negative.   Psychiatric/Behavioral: Negative.     Blood pressure 124/83, pulse 77, temperature 97.4 F (36.3 C), temperature source Oral, resp. rate 22, SpO2 100.00%. Physical Exam  Constitutional: He is oriented to person, place, and time. He appears well-developed and well-nourished. No distress.  HENT:  Head: Normocephalic and atraumatic.  Eyes: Conjunctivae normal are normal. Pupils are equal, round, and reactive to light. Right eye exhibits no discharge. Left eye exhibits no discharge. No scleral icterus.  Neck: Normal range of motion. Neck supple.  Cardiovascular: Normal rate and regular rhythm.   Respiratory: Effort normal and breath sounds normal. No respiratory distress. He has no wheezes. He has no rales.  GI: Soft. Bowel sounds are normal. He exhibits no distension. There is no tenderness. There is no rebound.  Musculoskeletal: He exhibits no edema and no tenderness.  Neurological: He is alert and oriented to person, place, and time.       Moves all extremities.  Skin: Skin is warm and dry. He is not diaphoretic.     Assessment/Plan #1. Chest pain concerning for unstable angina - patient has been admitted to step down unit. We'll titrate IV nitroglycerin infusion for chest pain. Aspirin. Cardiology has been consulted. Patient will be kept n.p.o. in anticipation of possible procedures. #2. Hypochromic microcytic anemia - patient states he has had a colonoscopy last of which was normal. Check anemia panel. If there is no significant fall in hemoglobin then further workup as outpatient.  Patient is admitted under Dr. Theressa Millard service.  CODE STATUS - full code.  Darielys Giglia N. 04/22/2012, 12:16 AM

## 2012-04-22 NOTE — Discharge Summary (Signed)
Physician Discharge Summary  Patient ID: Keith Cunningham MRN: [id number] DOB/AGE: [birth year] 71 y.o.  Admit date: 04/21/2012 Discharge date: 04/22/2012  Primary Care Physician:  Darnelle Bos, MD   Discharge Diagnoses:    Principal Problem:  *Chest pain Active Problems:  Iron deficiency anemia      Medication List     As of 04/22/2012  4:50 PM    TAKE these medications         aspirin EC 81 MG tablet   Take 1 tablet (81 mg total) by mouth daily.      co-enzyme Q-10 30 MG capsule   Take 30 mg by mouth daily.      ferrous sulfate 325 (65 FE) MG tablet   Take 1 tablet (325 mg total) by mouth 2 (two) times daily with a meal.      multivitamin with minerals Tabs   Take 1 tablet by mouth daily.          Disposition and Follow-up:  Will be discharged home today in stable and improved condition. Will follow up with Dr. Katrinka Blazing in the morning for a stress test. I have also advised he follow up with Dr. Earl Gala for workup of his iron deficiency anemia.  Consults:  Cardiology, Dr. Katrinka Blazing.   Significant Diagnostic Studies:  Dg Chest 2 View  04/21/2012  *RADIOLOGY REPORT*  Clinical Data: - Chest pain.  Syncope.  CHEST - 2 VIEW  Comparison: None.  Findings: Cardiomediastinal silhouette unremarkable.   Lungs clear. Bronchovascular markings normal.  Pulmonary vascularity normal.  No pneumothorax.  No pleural effusions.  Degenerative changes and DISH involving the thoracic spine.  IMPRESSION: No acute cardiopulmonary disease.   Original Report Authenticated By: Hulan Saas, M.D.     Brief H and P: For complete details please refer to admission H and P, but in brief patient is a 71 y/o man started developing some chest pressure last evening around 8 PM while at home. Patient initially felt dizzy and short of breath after which he went to the bathroom to have a bowel movement after which he started developing chest pressure felt nauseated and had diaphoresis. The chest  pressure persisted and he decided to come to the ER. He was brought to the ER by his wife in a car probably patient threw up once. In the ER EKG and cardiac enzymes did not show any acute. Chest x-ray was unremarkable. Patient's chest pain improved with nitroglycerin sublingual was still persist and has been admitted for further management. Patient denies any fever chills or any productive cough. We were asked to admit him for further evaluation and management.     Hospital Course:  Principal Problem:  *Chest pain Active Problems:  Iron deficiency anemia   Chest pain -Has r/o for ACS with 3 negative sets of enzymes and EKG without acute ischemic changes. -He does not have a lot of CAD risk factors other than age and sex. -His CP was concerning tho. -Has been evaluated by Dr. Katrinka Blazing with cardiology and the plan is to DC him home today and he will follow up with Dr. Katrinka Blazing in the am for an outpatient stress test, as he has declined a cardiac cath.  Iron Deficiency Anemia -Ferritin is 6. -Will place on BID ferrous sulfate at time of DC. -Had a colonoscopy 1 year ago with findings of only hemorrhoids for which he had surgery. -It is possible that bleeding from the hemorrhoids caused his iron deficiency. -No indication for transfusion at his  point with a Hb of 10. -Further workup can be accomplished as an outpatient.  Time spent on Discharge: Greater than 30 minutes.  SignedChaya Jan [facility] Pager: [phone] 04/22/2012, 4:50 PM

## 2012-04-22 NOTE — Progress Notes (Addendum)
Patient seen and examined and discussed with wife at bedside. Patient was admitted earlier today for CP. Was seen by cardiology overnight and has been kept NPO for a possible procedure. He has now ruled out with 3 negative troponins. He is CP free and we have asked for the nitro drip to be weaned. Have called [facility] to make sure patient is seen. If no further inpatient tests to be done, he can be discharged home later today. Note he also has iron deficiency anemia with a ferritin of 6. Start iron supplementation. States he has had a normal colonoscopy in the past. This can be further worked up as an outpatient.  Peggye Pitt, MD [facility] Pager: [phone]

## 2012-04-22 NOTE — Consult Note (Signed)
CARDIOLOGY CONSULT NOTE  Patient ID: Keith Cunningham, MRN: [id number], DOB/AGE: Nov 05, 1940 71 y.o. Admit date: 04/21/2012 Date of Consult: 04/22/2012  Primary Physician: [facility] Primary Cardiologist: none  Chief Complaint: Chest pain Reason for Consultation: concern for ACS  HPI: 71 y.o. male w/ no cardiac history or risk factors beyond gender and age who presents to [facility] with complaints of sudden onset of chest pressure. He reports that he was in his usual state of health today and was able to push mow his 1/2 acre yard without difficulty. After dinner around 8 pm while resting, he had sudden onset of lightheadedness and dyspnea. He got up to go have a BM at which time the lightheadedness worsened and he had chest pressure. Associated with diaphoresis. At that time, he sought ER care and was driven by his wife. During the drive over he felt nauseated and subsequently vomited. Nonbloody. His chest pressure peaked at 5-6 out of 10 and improved with a SL nitro to 1-2 though he felt acutely lightheaded and his SBP dropped into the 90s.   Denies history of chest pain previously. Tends to avoid going to the doctor per his wife. No history of CAD or stress testing. Active and in shape. Never limited by SOB or chest pain.   Currently reports chest pressure of 1/10. Dyspnea much improved. No diaphoresis.  No recent GI bleeding. No melena or coffee grounds. No hematuria.  No history of blood clot. No LE swelling.  Medical History 1. Hemorrhoidal bleed requiring blood xfusion 2. Remote history of PUD; EGD in 2011 normal per pt 3. Melanoma s/p resection  Surgical History:  Past Surgical History  Procedure Date  . Anus surgery     hemmrhoid removal  . Melanoma removal     on L side of head     Home Meds: Prior to Admission medications   Medication Sig Start Date End Date Taking? Authorizing Provider  co-enzyme Q-10 30 MG capsule Take 30 mg by mouth daily.   Yes Historical  Provider, MD  Multiple Vitamin (MULTIVITAMIN WITH MINERALS) TABS Take 1 tablet by mouth daily.   Yes Historical Provider, MD    Inpatient Medications:    . [COMPLETED] aspirin  324 mg Oral Once    Allergies: No Known Allergies  History   Social History  . Marital Status: Married    Spouse Name: N/A    Number of Children: N/A  . Years of Education: N/A   Occupational History  . Not on file.   Social History Main Topics  . Smoking status: Never Smoker   . Smokeless tobacco: Not on file  . Alcohol Use: No  . Drug Use: No  . Sexually Active:    Other Topics Concern  . Not on file   Social History Narrative  . No narrative on file     Family History  Problem Relation Age of Onset  . Diabetes Mellitus II Mother    CAD at advanced age in father  Review of Systems: General: negative for chills, fever, night sweats or weight changes.  Cardiovascular: see HPI. No CHF symptoms. Dermatological: negative for rash Respiratory: negative for cough or wheezing Urologic: negative for hematuria Abdominal: negative for nausea, vomiting, diarrhea, bright red blood per rectum, melena, or hematemesis Neurologic: negative for visual changes, syncope. +lightedness. All other systems reviewed and are otherwise negative except as noted above.  Labs: No results found for this basename: CKTOTAL:4,CKMB:4,TROPONINI:4 in the last 72 hours Lab Results  Component Value Date   WBC 9.9 04/21/2012   HGB 11.1* 04/21/2012   HCT 35.2* 04/21/2012   MCV 74.4* 04/21/2012   PLT 330 04/21/2012    Lab 04/21/12 2050  NA 139  K 3.8  CL 100  CO2 24  BUN 14  CREATININE 0.89  CALCIUM 9.4  PROT 7.1  BILITOT 0.2*  ALKPHOS 100  ALT 37  AST 36  GLUCOSE 123*    Radiology/Studies:  Dg Chest 2 View  04/21/2012  *RADIOLOGY REPORT*  Clinical Data: - Chest pain.  Syncope.  CHEST - 2 VIEW  Comparison: None.  Findings: Cardiomediastinal silhouette unremarkable.   Lungs clear. Bronchovascular markings  normal.  Pulmonary vascularity normal.  No pneumothorax.  No pleural effusions.  Degenerative changes and DISH involving the thoracic spine.  IMPRESSION: No acute cardiopulmonary disease.   Original Report Authenticated By: Hulan Saas, M.D.     EKG: sinus, no ST or TW changes.  Physical Exam: Blood pressure 124/74, pulse 64, temperature 97.4 F (36.3 C), temperature source Oral, resp. rate 16, SpO2 100.00%. General: Well developed, well nourished, in no acute distress. Head: Normocephalic, atraumatic, sclera non-icteric, no xanthomas, nares are without discharge.  Neck: Supple. Negative for carotid bruits. JVD not elevated. Lungs: Clear bilaterally to auscultation without wheezes, rales, or rhonchi. Breathing is unlabored. Heart: RRR with S1 S2. No murmurs, rubs, or gallops appreciated. Unable to provoke pain with palpation. Abdomen: Soft, non-tender, non-distended with normoactive bowel sounds. No hepatomegaly. No rebound/guarding. No obvious abdominal masses. Msk:  Strength and tone appear normal for age. Extremities: No clubbing or cyanosis. No edema.  Distal pedal pulses are 2+ and equal bilaterally. Neuro: Alert and oriented X 3. Moves all extremities spontaneously. Psych:  Responds to questions appropriately with a normal affect.   Problem List 1. Chest pressure, concerning for unstable angina 2. Mild microcytic anemia, no source of bleeding by history 3. Remote history of GI bleed  Assessment and Plan:  71 y.o. male w/ no cardiac history or risk factors beyond gender and age who presents to [facility] with complaints of sudden onset of chest pressure, dyspnea nausea, vomiting and diaphoresis.  His symptoms are rather consistent with ACS and this is highest on the differential.. PE is low on the differential (no physical exam signs, no hypoxia, no tachycardia). Despite remote history of PUD, doubt GI source of symptoms.  At this time, would treat as consistent with acute  coronary syndrome and continue serial enzymes, telemetry and try to get chest pain free. Reasonable to use nitro gtt to achieve this with hold parameters of SBP < 95.  Continue aspirin 325 qday. In regards to further anticoagulation, would have low threshold to initiate heparin and would start if unable to get pain free on nitro gtt, if EKG changes, or if biomarkers become positive. Main reason to hold off at this time is due to microcytic anemia of unknown etiology.  Check fasting lipids. Reasonable to add statin for pleiotropic benefits in ACS.  Keep NPO for possible procedure (cath vs. Stress MPI).  Summary of recommendations: 1. Aspirin, statin 2. Serial enzymes, telemetry. Goal of chest pain free, use nitro gtt 3. Start heparin for criteria listed above 4. NPO for possible procedure.  Please call if chest pain persists despite interventions described above. [facility] to follow up in the AM.  Recommendations discussed with Dr. Fredrich Romans.  Signed, Darwin Guastella C. MD 04/22/2012, 1:18 AM

## 2012-04-26 ENCOUNTER — Other Ambulatory Visit: Payer: Self-pay | Admitting: Interventional Cardiology

## 2012-04-29 ENCOUNTER — Inpatient Hospital Stay (HOSPITAL_BASED_OUTPATIENT_CLINIC_OR_DEPARTMENT_OTHER)
Admission: RE | Admit: 2012-04-29 | Payer: Medicare Other | Source: Ambulatory Visit | Admitting: Interventional Cardiology

## 2012-04-29 ENCOUNTER — Encounter (HOSPITAL_BASED_OUTPATIENT_CLINIC_OR_DEPARTMENT_OTHER): Admission: RE | Payer: Self-pay | Source: Ambulatory Visit

## 2012-04-29 SURGERY — JV LEFT HEART CATHETERIZATION WITH CORONARY ANGIOGRAM
Anesthesia: Moderate Sedation

## 2012-05-06 ENCOUNTER — Other Ambulatory Visit: Payer: Self-pay | Admitting: *Deleted

## 2012-05-06 ENCOUNTER — Inpatient Hospital Stay (HOSPITAL_BASED_OUTPATIENT_CLINIC_OR_DEPARTMENT_OTHER)
Admission: RE | Admit: 2012-05-06 | Discharge: 2012-05-06 | Disposition: A | Discharge status: Hospice | Payer: Medicare Other | Source: Ambulatory Visit | Attending: Interventional Cardiology | Admitting: Interventional Cardiology

## 2012-05-06 ENCOUNTER — Encounter (HOSPITAL_BASED_OUTPATIENT_CLINIC_OR_DEPARTMENT_OTHER)
Admission: RE | Disposition: A | Discharge status: Hospice | Payer: Self-pay | Source: Ambulatory Visit | Attending: Interventional Cardiology

## 2012-05-06 ENCOUNTER — Encounter (HOSPITAL_COMMUNITY): Payer: Self-pay | Admitting: General Practice

## 2012-05-06 ENCOUNTER — Inpatient Hospital Stay (HOSPITAL_COMMUNITY)
Admission: AD | Admit: 2012-05-06 | Discharge: 2012-05-14 | DRG: 234 | Disposition: A | Discharge status: Home / Self Care | Payer: Medicare Other | Source: Ambulatory Visit | Attending: Cardiothoracic Surgery | Admitting: Cardiothoracic Surgery

## 2012-05-06 DIAGNOSIS — I2510 Atherosclerotic heart disease of native coronary artery without angina pectoris: Principal | ICD-10-CM | POA: Diagnosis present

## 2012-05-06 DIAGNOSIS — Z79899 Other long term (current) drug therapy: Secondary | ICD-10-CM

## 2012-05-06 DIAGNOSIS — I200 Unstable angina: Secondary | ICD-10-CM | POA: Diagnosis present

## 2012-05-06 DIAGNOSIS — R9439 Abnormal result of other cardiovascular function study: Secondary | ICD-10-CM | POA: Diagnosis present

## 2012-05-06 DIAGNOSIS — R0609 Other forms of dyspnea: Secondary | ICD-10-CM | POA: Insufficient documentation

## 2012-05-06 DIAGNOSIS — R079 Chest pain, unspecified: Secondary | ICD-10-CM

## 2012-05-06 DIAGNOSIS — I214 Non-ST elevation (NSTEMI) myocardial infarction: Secondary | ICD-10-CM | POA: Diagnosis present

## 2012-05-06 DIAGNOSIS — R0989 Other specified symptoms and signs involving the circulatory and respiratory systems: Secondary | ICD-10-CM | POA: Insufficient documentation

## 2012-05-06 DIAGNOSIS — I319 Disease of pericardium, unspecified: Secondary | ICD-10-CM | POA: Diagnosis present

## 2012-05-06 DIAGNOSIS — I498 Other specified cardiac arrhythmias: Secondary | ICD-10-CM | POA: Diagnosis present

## 2012-05-06 DIAGNOSIS — D62 Acute posthemorrhagic anemia: Secondary | ICD-10-CM | POA: Diagnosis not present

## 2012-05-06 DIAGNOSIS — I970 Postcardiotomy syndrome: Secondary | ICD-10-CM | POA: Diagnosis not present

## 2012-05-06 DIAGNOSIS — E785 Hyperlipidemia, unspecified: Secondary | ICD-10-CM | POA: Diagnosis present

## 2012-05-06 HISTORY — DX: Other hemorrhoids: K64.8

## 2012-05-06 HISTORY — DX: Personal history of other medical treatment: Z92.89

## 2012-05-06 HISTORY — DX: Atherosclerotic heart disease of native coronary artery without angina pectoris: I25.10

## 2012-05-06 HISTORY — DX: Pneumonia, unspecified organism: J18.9

## 2012-05-06 HISTORY — DX: Acute myocardial infarction, unspecified: I21.9

## 2012-05-06 HISTORY — DX: Anemia, unspecified: D64.9

## 2012-05-06 HISTORY — DX: Personal history of other diseases of the digestive system: Z87.19

## 2012-05-06 HISTORY — DX: Malignant melanoma of unspecified part of face: C43.30

## 2012-05-06 HISTORY — DX: Angina pectoris, unspecified: I20.9

## 2012-05-06 SURGERY — JV LEFT HEART CATHETERIZATION WITH CORONARY ANGIOGRAM
Anesthesia: Moderate Sedation

## 2012-05-06 MED ORDER — ASPIRIN EC 325 MG PO TBEC: 325.0000 mg | DELAYED_RELEASE_TABLET | Freq: Every day | ORAL | Status: DC

## 2012-05-06 MED ORDER — SODIUM CHLORIDE 0.9 % IV SOLN: INTRAVENOUS | Status: DC

## 2012-05-06 MED ORDER — OXYCODONE-ACETAMINOPHEN 5-325 MG PO TABS: 1.0000 | ORAL_TABLET | ORAL | Status: DC | PRN

## 2012-05-06 MED ORDER — FERROUS SULFATE 325 (65 FE) MG PO TABS
325.0000 mg | ORAL_TABLET | Freq: Two times a day (BID) | ORAL | Status: DC
  Administered 2012-05-07 – 2012-05-09 (×6): 325 mg via ORAL
  Filled 2012-05-06 (×9): qty 1

## 2012-05-06 MED ORDER — NITROGLYCERIN IN D5W 200-5 MCG/ML-% IV SOLN
5.0000 ug/min | INTRAVENOUS | Status: DC
  Administered 2012-05-06: 5 ug/min via INTRAVENOUS
  Filled 2012-05-06: qty 250

## 2012-05-06 MED ORDER — SODIUM CHLORIDE 0.9 % IJ SOLN: 3.0000 mL | INTRAMUSCULAR | Status: DC | PRN

## 2012-05-06 MED ORDER — ONDANSETRON HCL 4 MG/2ML IJ SOLN: 4.0000 mg | Freq: Four times a day (QID) | INTRAMUSCULAR | Status: DC | PRN

## 2012-05-06 MED ORDER — ZOLPIDEM TARTRATE 5 MG PO TABS: 5.0000 mg | ORAL_TABLET | Freq: Every evening | ORAL | Status: DC | PRN

## 2012-05-06 MED ORDER — ENOXAPARIN SODIUM 80 MG/0.8ML ~~LOC~~ SOLN
70.0000 mg | Freq: Two times a day (BID) | SUBCUTANEOUS | Status: AC
  Administered 2012-05-07 – 2012-05-09 (×6): 70 mg via SUBCUTANEOUS
  Filled 2012-05-06 (×7): qty 0.8

## 2012-05-06 MED ORDER — SODIUM CHLORIDE 0.9 % IV SOLN
INTRAVENOUS | Status: DC
  Administered 2012-05-06 – 2012-05-08 (×6): via INTRAVENOUS

## 2012-05-06 MED ORDER — NITROGLYCERIN IN D5W 200-5 MCG/ML-% IV SOLN: 5.0000 ug/min | INTRAVENOUS | Status: DC

## 2012-05-06 MED ORDER — COENZYME Q10 30 MG PO CAPS: 30.0000 mg | ORAL_CAPSULE | Freq: Every day | ORAL | Status: DC

## 2012-05-06 MED ORDER — SODIUM CHLORIDE 0.9 % IV SOLN
INTRAVENOUS | Status: DC
  Administered 2012-05-06: 11:00:00 via INTRAVENOUS

## 2012-05-06 MED ORDER — ACETAMINOPHEN 325 MG PO TABS
650.0000 mg | ORAL_TABLET | ORAL | Status: DC | PRN
  Administered 2012-05-07 – 2012-05-09 (×2): 650 mg via ORAL
  Filled 2012-05-06 (×2): qty 2

## 2012-05-06 MED ORDER — ACETAMINOPHEN 325 MG PO TABS: 650.0000 mg | ORAL_TABLET | ORAL | Status: DC | PRN

## 2012-05-06 MED ORDER — DIAZEPAM 5 MG PO TABS
5.0000 mg | ORAL_TABLET | ORAL | Status: AC
  Administered 2012-05-06: 5 mg via ORAL

## 2012-05-06 MED ORDER — SODIUM CHLORIDE 0.9 % IJ SOLN: 3.0000 mL | Freq: Two times a day (BID) | INTRAMUSCULAR | Status: DC

## 2012-05-06 MED ORDER — ASPIRIN 81 MG PO CHEW
324.0000 mg | CHEWABLE_TABLET | ORAL | Status: AC
  Administered 2012-05-06: 324 mg via ORAL

## 2012-05-06 MED ORDER — ADULT MULTIVITAMIN W/MINERALS CH
1.0000 | ORAL_TABLET | Freq: Every day | ORAL | Status: DC
  Administered 2012-05-07 – 2012-05-09 (×3): 1 via ORAL
  Filled 2012-05-06 (×4): qty 1

## 2012-05-06 MED ORDER — SODIUM CHLORIDE 0.9 % IV SOLN: 250.0000 mL | INTRAVENOUS | Status: DC | PRN

## 2012-05-06 MED ORDER — ASPIRIN EC 325 MG PO TBEC
325.0000 mg | DELAYED_RELEASE_TABLET | Freq: Every day | ORAL | Status: DC
  Administered 2012-05-07 – 2012-05-09 (×3): 325 mg via ORAL
  Filled 2012-05-06 (×4): qty 1

## 2012-05-06 MED ORDER — ATORVASTATIN CALCIUM 40 MG PO TABS
40.0000 mg | ORAL_TABLET | Freq: Every day | ORAL | Status: DC
  Administered 2012-05-06 – 2012-05-13 (×6): 40 mg via ORAL
  Filled 2012-05-06 (×10): qty 1

## 2012-05-06 NOTE — Progress Notes (Signed)
ANTICOAGULATION CONSULT NOTE - Initial Consult  Pharmacy Consult for Lovenox Indication: chest pain/ACS  No Known Allergies  Patient Measurements:   Heparin Dosing Weight: 70 kg  Vital Signs: BP: 117/80 mmHg (11/21 1534) Pulse Rate: 71  (11/21 1534)  Labs: No results found for this basename: HGB:2,HCT:3,PLT:3,APTT:3,LABPROT:3,INR:3,HEPARINUNFRC:3,CREATININE:3,CKTOTAL:3,CKMB:3,TROPONINI:3 in the last 72 hours  The CrCl is unknown because both a height and weight (above a minimum accepted value) are required for this calculation.   Medical History: No past medical history on file.  Medications:  Prescriptions prior to admission  Medication Sig Dispense Refill  . aspirin EC 81 MG tablet Take 1 tablet (81 mg total) by mouth daily.      Marland Kitchen co-enzyme Q-10 30 MG capsule Take 30 mg by mouth daily.      . ferrous sulfate 325 (65 FE) MG tablet Take 1 tablet (325 mg total) by mouth 2 (two) times daily with a meal.  60 tablet    . Multiple Vitamin (MULTIVITAMIN WITH MINERALS) TABS Take 1 tablet by mouth daily.        Assessment: 71 year old man s/p cath today.  Needs CABG. Goal of Therapy:  Anti-Xa level 0.6-1.2 units/ml 4hrs after LMWH dose given Monitor platelets by anticoagulation protocol: Yes   Plan:  Lovenox 70 mg q12 - start 8 hours after sheath out which will be around midnight. CBC every 3 days.  Keith Cunningham 05/06/2012,5:36 PM

## 2012-05-06 NOTE — Op Note (Signed)
     Diagnostic Cardiac Catheterization Report  Keith Cunningham  71 y.o.  male 1941-04-16  Procedure Date: 05/06/2012 Referring Physician: Theressa Millard, M.D. Primary Cardiologist:: HWB Leia Alf, M.D.   PROCEDURE:  Left heart catheterization with selective coronary angiography, left ventriculogram.  INDICATIONS:  Recent early positive exercise treadmill test. In obtaining the history and physical today, the patient has been having nearly continuous rest pain and dyspnea on exertion that he has not made [place] aware.  The risks, benefits, and details of the procedure were explained to the patient.  The patient verbalized understanding and wanted to proceed.  Informed written consent was obtained.  PROCEDURE TECHNIQUE:  After Xylocaine anesthesia a 5 French sheath was placed in the right radial artery with a single anterior needle wall stick.   Coronary angiography was done using a 5 French 4 cm right Judkins and 5 French 3.5 cm left Judkins catheter.  Left ventriculography was done using a 5 French Judkins right catheter using hand injection.   200 mcg of intracoronary nitroglycerin was administered into the left coronary   CONTRAST:  Total of 100 cc.  COMPLICATIONS:  None.    HEMODYNAMICS:  Aortic pressure was 97/55 mmHg; LV pressure was 101/11 mmHg; LVEDP 13 mm mercury.  There was no gradient between the left ventricle and aorta.    ANGIOGRAPHIC DATA:   The left main coronary artery is widely patent.  The left anterior descending artery is focal eccentric 95% ostial LAD stenosis.-Use proximal 30-50% narrowing.  The ramus intermedius branch contains a segmental 95% stenosis. The severity of the stenosis is noticed most significantly on the LAO caudal (spider view).  The left circumflex artery is 50-60% narrowed after the origin of the left atrial recurrent branch..  The right coronary artery is 50-70% distal RCA before the origin of the PDA. The left ventricular branch proximal  segment contains 50% narrowing.Marland Kitchen  LEFT VENTRICULOGRAM:  Left ventricular angiogram was done in the 30 RAO projection and revealed normal left ventricular wall motion and systolic function with an estimated ejection fraction of 70 %.  LVEDP was normal.  IMPRESSIONS:  1. Ostial LAD and ramus obstruction accounting for the early positive exercise treadmill test. 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  2. Normal left ventricular function  3. Early positive exercise treadmill test with progression of symptoms and clinical unstable angina with episodes of pain at rest.   RECOMMENDATION:  TCTS  consultation for expedited coronary bypass grafting.  The patient needs to be in hospital until revascularization occurred since he is having pain at rest

## 2012-05-06 NOTE — Progress Notes (Signed)
TR band removed @ 1615.  Tegaderm dressing applied to right radial site, right wrist immobilizer applied.  Report called to [facility] on [address] west.

## 2012-05-06 NOTE — H&P (Addendum)
  The patient was admitted with prolonged chest pain and ruled out approximately 2 weeks ago. We recommended coronary angiography but he refused. He subsequently had an outpatient early positive ETT. He tells me today that he has had nearly continuous chest tightness and dyspnea on exertion since the exercise test. He did not tell anyone because "it wasn't that bad" . The cath is being performed to exclude high risk CAD and to help guide therapy. Procedural risks, including stroke, death, MI, contrast allergy, limb ischemia, CABG, among others discussed and accepted.   The past medical history and physical examination is unchanged from that present on November 7 during his overnight hospital stay.

## 2012-05-06 NOTE — OR Nursing (Signed)
Attempted to begin deflation of TR band, bleeding occurred, 12ml total to control

## 2012-05-07 ENCOUNTER — Inpatient Hospital Stay (HOSPITAL_COMMUNITY): Payer: Medicare Other

## 2012-05-07 DIAGNOSIS — Z01810 Encounter for preprocedural cardiovascular examination: Secondary | ICD-10-CM

## 2012-05-07 DIAGNOSIS — I2510 Atherosclerotic heart disease of native coronary artery without angina pectoris: Secondary | ICD-10-CM

## 2012-05-07 MED ORDER — NITROGLYCERIN IN D5W 200-5 MCG/ML-% IV SOLN: 5.0000 ug/min | INTRAVENOUS | Status: DC

## 2012-05-07 NOTE — Progress Notes (Signed)
Patient Name: Keith Cunningham Date of Encounter: 05/07/2012    SUBJECTIVE: He is still having chest discomfort with minimal exertion and at rest. He is scheduled for Monday. He is not very forthcoming with symptoms. We discussed the need to let us know about chest pain when it occurs to allow [place] to keep him safe.  TELEMETRY:  NSR: Filed Vitals:   05/06/12 2100 05/07/12 0700 05/07/12 0900  BP: 104/65 116/62   Pulse: 76 68   Temp: 98.2 F (36.8 C) 98.3 F (36.8 C)   TempSrc: Oral Oral   Height:   5\' 6"  (1.676 m)  Weight:   69.854 kg (154 lb)  SpO2: 99% 98%     Intake/Output Summary (Last 24 hours) at 05/07/12 1048 Last data filed at 05/07/12 0700  Gross per 24 hour  Intake      0 ml  Output   1625 ml  Net  -1625 ml    Radiology/Studies:  NAD  Physical Exam: Blood pressure 116/62, pulse 68, temperature 98.3 F (36.8 C), temperature source Oral, height 5\' 6"  (1.676 m), weight 69.854 kg (154 lb), SpO2 98.00%. Weight change:    Radial cath site okay. Cardiac exam is unremarkable  ASSESSMENT:  1. Class 3 angina despite IV NTG and anticoagulation 2. Hyperlipidemia  Plan:  1. Will transfer to step down or CCU since surgery not possible until Monday.  2. If continues to have uncontrollable symptoms, he may need surgery before Monday.  Selinda Eon 05/07/2012, 10:48 AM

## 2012-05-07 NOTE — Consult Note (Signed)
[address]            [name] [address]          [phone]      Keith Cunningham [facility] Medical Record #[id number] Date of Birth: 08-18-1940  Referring: Dr Keith Cunningham Primary Care: Keith Bos, MD  Chief Complaint:  Chest pain  History of Present Illness:    Patient  felt dizzy and short of breath after which he went to the bathroom to have a bowel movement after which he started developing chest pressure felt nauseated and had diaphoresis on NOV 7. The chest pressure persisted and he decided to come to the ER at [facility] . Marland Kitchen In the ER EKG and cardiac enzymes did not show any acute. Chest x-ray was unremarkable. Patient's chest pain improved with nitroglycerin sublingual was still persist and has been admitted for further management. He was discarded home then. He underwent cardiac cath today because of on going chest pain at home. Patient denies any fever chills or any productive cough.       Current Activity/ Functional Status: Patient is  independent with mobility/ambulation, transfers, ADL's, IADL's.   Past Medical History  Diagnosis Date  . Pneumonia     "as a child" (05/06/2012)  . Myocardial infarction 04/21/2012  . Anginal pain 04/21/2012  . Coronary artery disease   . Shortness of breath     "due to this heart attack" (05/06/2012)  . Anemia   . History of blood transfusion 2012    "related to bleeding hemorrhoids" (05/06/2012)  . Bleeding internal hemorrhoids 2012  . H/O hiatal hernia   . Melanoma of face 2009    left temple    Past Surgical History  Procedure Date  . Tonsillectomy and adenoidectomy ~ 1950  . Excisional hemorrhoidectomy 2012    "tried to shrink 1st w/needle; cut them out 2 wk later" (05/06/2012)  . Melanoma excision 2009    "left side of head" (05/06/2012)  . Cardiac catheterization 04/21/2012    Family History  Problem Relation Age of Onset  . Diabetes Mellitus II Mother     History     Social History  . Marital Status: Married    Spouse Name: N/A    Number of Children: N/A  . Years of Education: N/A   Occupational History  . Not on file.   Social History Main Topics  . Smoking status: Never Smoker   . Smokeless tobacco: Never Used  . Alcohol Use: Yes     Comment: 05/06/2012 "used to drink back years ago; nothing in last 25 yrs"  . Drug Use: No  . Sexually Active: Yes   Other Topics Concern  . Not on file   Social History Narrative  . No narrative on file    History  Smoking status  . Never Smoker   Smokeless tobacco  . Never Used    History  Alcohol Use  . Yes    Comment: 05/06/2012 "used to drink back years ago; nothing in last 25 yrs"     No Known Allergies  Prescriptions prior to admission  Medication Sig Dispense Refill  . aspirin EC 81 MG tablet Take 1 tablet (81 mg total) by mouth daily.      Marland Kitchen co-enzyme Q-10 30 MG capsule Take 30 mg by mouth daily.      . Multiple Vitamin (MULTIVITAMIN  WITH MINERALS) TABS Take 1 tablet by mouth daily.         Current Facility-Administered Medications  Medication Dose Route Frequency Provider Last Rate Last Dose  . 0.9 %  sodium chloride infusion   Intravenous Continuous Lyn Records III, MD 125 mL/hr at 05/07/12 0420    . acetaminophen (TYLENOL) tablet 650 mg  650 mg Oral Q4H PRN Lyn Records III, MD      . aspirin EC tablet 325 mg  325 mg Oral Daily Lyn Records III, MD      . atorvastatin (LIPITOR) tablet 40 mg  40 mg Oral q1800 Lyn Records III, MD   40 mg at 05/06/12 1846  . enoxaparin (LOVENOX) injection 70 mg  70 mg Subcutaneous Q12H Lyn Records III, MD   70 mg at 05/07/12 0019  . ferrous sulfate tablet 325 mg  325 mg Oral BID WC Lyn Records III, MD      . multivitamin with minerals tablet 1 tablet  1 tablet Oral Daily Lyn Records III, MD      . nitroGLYCERIN 0.2 mg/mL in dextrose 5 % infusion  5 mcg/min Intravenous Continuous Lyn Records III, MD 1.5 mL/hr at 05/06/12 1847 5  mcg/min at 05/06/12 1847  . ondansetron (ZOFRAN) injection 4 mg  4 mg Intravenous Q6H PRN Lyn Records III, MD      . oxyCODONE-acetaminophen (PERCOCET/ROXICET) 5-325 MG per tablet 1-2 tablet  1-2 tablet Oral Q4H PRN Lyn Records III, MD      . zolpidem [facility]) tablet 5 mg  5 mg Oral QHS PRN Lyn Records III, MD      . [DISCONTINUED] co-enzyme Q-10 capsule 30 mg  30 mg Oral Daily Lesleigh Noe, MD       Facility-Administered Medications Ordered in Other Encounters  Medication Dose Route Frequency Provider Last Rate Last Dose  . [COMPLETED] aspirin chewable tablet 324 mg  324 mg Oral Pre-Cath Lyn Records III, MD   324 mg at 05/06/12 1025  . [COMPLETED] diazepam (VALIUM) tablet 5 mg  5 mg Oral On Call Lyn Records III, MD   5 mg at 05/06/12 1025  . [DISCONTINUED] 0.9 %  sodium chloride infusion  250 mL Intravenous PRN Lyn Records III, MD      . [DISCONTINUED] 0.9 %  sodium chloride infusion   Intravenous Continuous Lyn Records III, MD 75 mL/hr at 05/06/12 1030    . [DISCONTINUED] 0.9 %  sodium chloride infusion   Intravenous Continuous Lyn Records III, MD      . [DISCONTINUED] acetaminophen (TYLENOL) tablet 650 mg  650 mg Oral Q4H PRN Lyn Records III, MD      . [DISCONTINUED] aspirin EC tablet 325 mg  325 mg Oral Daily Lyn Records III, MD      . [DISCONTINUED] nitroGLYCERIN 0.2 mg/mL in dextrose 5 % infusion  5 mcg/min Intravenous Continuous Lyn Records III, MD      . [DISCONTINUED] ondansetron [facility]) injection 4 mg  4 mg Intravenous Q6H PRN Lyn Records III, MD      . [DISCONTINUED] sodium chloride 0.9 % injection 3 mL  3 mL Intravenous Q12H Lyn Records III, MD      . [DISCONTINUED] sodium chloride 0.9 % injection 3 mL  3 mL Intravenous PRN Lyn Records III, MD      . [DISCONTINUED] zolpidem [facility]) tablet 5 mg  5 mg Oral QHS PRN Lesleigh Noe, MD           Review of Systems:     Cardiac Review of Systems: Y or N  Chest Pain Cove.Etienne    ]  Resting SOB Milo.Brash    ] Exertional SOB  Cove.Etienne  ]  Pollyann Kennedy Milo.Brash  ]   Pedal Edema [  n ]    Palpitations Milo.Brash  ] Syncope  Milo.Brash  ]   Presyncope [  n ]  General Review of Systems: [Y] = yes [  ]=no Constitional: recent weight change [n  ]; anorexia [n  ]; fatigue [ y ]; nausea [ n ]; night sweats [  ]; fever [  ]; or chills [  ];                                                                                                                                          Dental: poor dentition[n  ]; Last Dentist visit  Eye : blurred vision [ n ]; diplopia [   ]; vision changes [  ];  Amaurosis fugax[ n]; Resp: cough [  ];  wheezing[  ];  hemoptysis[  ]; shortness of breath[  ]; paroxysmal nocturnal dyspnea[  ]; dyspnea on exertion[  ]; or orthopnea[  ];  GI:  gallstones[  ], vomiting[  ];  dysphagia[  ]; melena[ y ];  hematochezia [  ]; heartburn[  ];   Hx of  Colonoscopy[y  ]; GU: kidney stones [  ]; hematuria[  ];   dysuria [  ];  nocturia[  ];  history of     obstruction [n  ];             Skin: rash, swelling[  ];, hair loss[  ];  peripheral edema[  ];  or itching[  ]; Musculosketetal: myalgias[  ];  joint swelling[  ];  joint erythema[  ];  joint pain[  ];  back pain[ y ];  Heme/Lymph: bruising[  ];  bleeding[  ];  anemia[  ];  Neuro: TIA[  ];  headaches[  ];  stroke[  ];  vertigo[  ];  seizures[  ];   paresthesias[  ];  difficulty walking[  ];  Psych:depression[  ]; anxiety[  ];  Endocrine: diabetes[n  ];  thyroid dysfunction[ n ];  Immunizations: Flu [  ]; Pneumococcal[  ];  Other:  Physical Exam: BP 104/65  Pulse 76  Temp 98.2 F (36.8 C) (Oral)  SpO2 99%  General appearance: alert, cooperative and no distress Neurologic: intact Heart: regular rate and rhythm, S1, S2 normal, no murmur, click, rub or gallop and normal apical impulse Lungs: clear to auscultation bilaterally and normal percussion bilaterally Abdomen: soft, non-tender; bowel sounds normal; no masses,  no organomegaly Extremities: extremities normal,  atraumatic, no cyanosis or edema, Homans sign is negative, no  sign of DVT and appears to have adquate vein in lower extremities   Diagnostic Studies & Laboratory data:     Recent Radiology Findings:  Chest 2 View  04/21/2012  *RADIOLOGY REPORT*  Clinical Data: - Chest pain.  Syncope.  CHEST - 2 VIEW  Comparison: None.  Findings: Cardiomediastinal silhouette unremarkable.   Lungs clear. Bronchovascular markings normal.  Pulmonary vascularity normal.  No pneumothorax.  No pleural effusions.  Degenerative changes and DISH involving the thoracic spine.  IMPRESSION: No acute cardiopulmonary disease.   Original Report Authenticated By: Hulan Saas, M.D.      Recent Lab Findings: Lab Results  Component Value Date   WBC 7.7 04/22/2012   HGB 10.3* 04/22/2012   HCT 34.6* 04/22/2012   PLT 297 04/22/2012   GLUCOSE 102* 04/22/2012   CHOL 166 04/22/2012   TRIG 103 04/22/2012   HDL 46 04/22/2012   LDLCALC 99 04/22/2012   ALT 32 04/22/2012   AST 27 04/22/2012   NA 138 04/22/2012   K 4.0 04/22/2012   CL 102 04/22/2012   CREATININE 0.86 04/22/2012   BUN 13 04/22/2012   CO2 27 04/22/2012   TSH 3.011 04/22/2012   INR 1.20 03/12/2010   CATH: PROCEDURE: Left heart catheterization with selective coronary angiography, left ventriculogram.  INDICATIONS: Recent early positive exercise treadmill test. In obtaining the history and physical today, the patient has been having nearly continuous rest pain and dyspnea on exertion that he has not made [place] aware.  The risks, benefits, and details of the procedure were explained to the patient. The patient verbalized understanding and wanted to proceed. Informed written consent was obtained.  PROCEDURE TECHNIQUE: After Xylocaine anesthesia a 5 French sheath was placed in the right radial artery with a single anterior needle wall stick. Coronary angiography was done using a 5 French 4 cm right Judkins and 5 French 3.5 cm left Judkins catheter. Left ventriculography was done using a  5 French Judkins right catheter using hand injection.  200 mcg of intracoronary nitroglycerin was administered into the left coronary  CONTRAST: Total of 100 cc.  COMPLICATIONS: None.  HEMODYNAMICS: Aortic pressure was 97/55 mmHg; LV pressure was 101/11 mmHg; LVEDP 13 mm mercury. There was no gradient between the left ventricle and aorta.  ANGIOGRAPHIC DATA: The left main coronary artery is widely patent.  The left anterior descending artery is focal eccentric 95% ostial LAD stenosis.-Use proximal 30-50% narrowing.  The ramus intermedius branch contains a segmental 95% stenosis. The severity of the stenosis is noticed most significantly on the LAO caudal (spider view).  The left circumflex artery is 50-60% narrowed after the origin of the left atrial recurrent branch..  The right coronary artery is 50-70% distal RCA before the origin of the PDA. The left ventricular branch proximal segment contains 50% narrowing.Marland Kitchen  LEFT VENTRICULOGRAM: Left ventricular angiogram was done in the 30 RAO projection and revealed normal left ventricular wall motion and systolic function with an estimated ejection fraction of 70 %. LVEDP was normal.  IMPRESSIONS: 1. Ostial LAD and ramus obstruction accounting for the early positive exercise treadmill test. 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  2. Normal left ventricular function  3. Early positive exercise treadmill test with progression of symptoms and clinical unstable angina with episodes of pain at rest.  RECOMMENDATION: TCTS consultation for expedited coronary bypass grafting.  The patient needs to be in hospital until revascularization occurred since he is having pain at rest  Assessment / Plan:    Coronary artery disease with nl lv function Anemia   Plan CABG Monday  The goals risks and alternatives of the planned surgical procedure CABG have been discussed with the patient in detail. The risks of the  procedure including death, infection, stroke, myocardial infarction, bleeding, blood transfusion have all been discussed specifically.  I have quoted Keith Cunningham a 4 % of perioperative mortality and a complication rate as high as 20 %. The patient's questions have been answered.Keith Cunningham is willing  to proceed with the planned procedure.      Delight Ovens MD  Beeper [phone] Office [phone] 05/06/2012 6:00pm

## 2012-05-07 NOTE — H&P (Addendum)
The patient was admitted with prolonged chest pain and ruled out approximately 04/22/12. We recommended coronary angiography but he refused. He subsequently had an outpatient early positive ETT. He tells me today that he has had nearly continuous chest tightness and dyspnea on exertion since the exercise test. He did not tell anyone because "it wasn't that bad" . The cath 05/06/12 demonstrated surgical anatomy.The past medical history and physical examination is unchanged from that present on November 7 admission and consult notes other than noted above.

## 2012-05-07 NOTE — Progress Notes (Signed)
PFT completed. Unconfirmed results placed in Shadow Chart. 

## 2012-05-07 NOTE — Progress Notes (Signed)
TCTS DAILY PROGRESS NOTE                   [address]            [place] [address]          [phone]        Procedure(s) (LRB): CORONARY ARTERY BYPASS GRAFTING (CABG) (N/A) INTRAOPERATIVE TRANSESOPHAGEAL ECHOCARDIOGRAM (N/A)  Total Length of Stay:  LOS: 1 day   Subjective: Some cp this am moved to stepdown, none since, says he had pfts and feels better now  Objective: Vital signs in last 24 hours: Temp:  [98.1 F (36.7 C)-98.7 F (37.1 C)] 98.1 F (36.7 C) (11/22 1656) Pulse Rate:  [65-76] 65  (11/22 1700) Cardiac Rhythm:  [-] Normal sinus rhythm (11/22 1600) Resp:  [15-20] 15  (11/22 1700) BP: (104-133)/(62-73) 121/73 mmHg (11/22 1700) SpO2:  [98 %-100 %] 99 % (11/22 1700) Weight:  [154 lb (69.854 kg)-156 lb 12 oz (71.1 kg)] 156 lb 12 oz (71.1 kg) (11/22 1449)  Filed Weights   05/07/12 0900 05/07/12 1449  Weight: 154 lb (69.854 kg) 156 lb 12 oz (71.1 kg)    Weight change:    Hemodynamic parameters for last 24 hours:    Intake/Output from previous day: 11/21 0701 - 11/22 0700 In: -  Out: 1625 [Urine:1625]  Intake/Output this shift: Total I/O In: 2323.3 [P.O.:1040; I.V.:1283.3] Out: 275 [Urine:275]  Current Meds: Scheduled Meds:   . aspirin EC  325 mg Oral Daily  . atorvastatin  40 mg Oral q1800  . enoxaparin (LOVENOX) injection  70 mg Subcutaneous Q12H  . ferrous sulfate  325 mg Oral BID WC  . multivitamin with minerals  1 tablet Oral Daily   Continuous Infusions:   . sodium chloride 125 mL/hr at 05/07/12 1600  . nitroGLYCERIN 5 mcg/min (05/07/12 1700)  . [DISCONTINUED] nitroGLYCERIN     PRN Meds:.acetaminophen, ondansetron (ZOFRAN) IV, oxyCODONE-acetaminophen, zolpidem  General appearance: alert and cooperative Neurologic: intact Heart: regular rate and rhythm, S1, S2 normal, no murmur, click, rub or gallop Lungs: clear to auscultation bilaterally Abdomen: soft, non-tender; bowel sounds normal; no masses,  no  organomegaly Extremities: extremities normal, atraumatic, no cyanosis or edema and Homans sign is negative, no sign of DVT  Lab Results: CBC:No results found for this basename: WBC:2,HGB:2,HCT:2,PLT:2 in the last 72 hours BMET: No results found for this basename: NA:2,K:2,CL:2,CO2:2,GLUCOSE:2,BUN:2,CREATININE:2,CALCIUM:2 in the last 72 hours  PT/INR: No results found for this basename: LABPROT,INR in the last 72 hours Radiology: No results found.   Assessment/Plan: S/P Procedure(s) (LRB): CORONARY ARTERY BYPASS GRAFTING (CABG) (N/A) INTRAOPERATIVE TRANSESOPHAGEAL ECHOCARDIOGRAM (N/A) For surgery Monday     Michaelann Gunnoe B 05/07/2012 6:01 PM

## 2012-05-07 NOTE — Progress Notes (Signed)
VASCULAR LAB PRELIMINARY  PRELIMINARY  PRELIMINARY  PRELIMINARY  Pre-op Cardiac Surgery  Carotid Findings:  Bilateral:  No evidence of hemodynamically significant internal carotid artery stenosis.   Vertebral artery flow is antegrade.     Upper Extremity Right Left  Brachial Pressures 129 Biphasic 130 Triphasic  Radial Waveforms Biphasic Triphasic  Ulnar Waveforms Biphasic Triphasic  Palmar Arch (Allen's Test) Abnormal Normal   Findings:  Doppler waveforms remain normal with radial compression an diminish greater than 50% with ulnar compression on the right. Left Doppler waveforms remained normal with both radial and ulnar compressions    Lower  Extremity Right Left  Dorsalis Pedis    Anterior Tibial    Posterior Tibial    Ankle/Brachial Indices      Findings:  Pedal pulses were palpable bilaterally   Keith Cunningham, RVS 05/07/2012, 12:01 PM

## 2012-05-07 NOTE — Progress Notes (Signed)
Utilization Review Completed.   Lyndsee Casa, RN, BSN Nurse Case Manager  [phone]  

## 2012-05-08 LAB — URINALYSIS, ROUTINE W REFLEX MICROSCOPIC
Bilirubin Urine: NEGATIVE
Glucose, UA: NEGATIVE mg/dL
Hgb urine dipstick: NEGATIVE
Ketones, ur: NEGATIVE mg/dL
Leukocytes, UA: NEGATIVE
Nitrite: NEGATIVE
Protein, ur: NEGATIVE mg/dL
Specific Gravity, Urine: 1.01 (ref 1.005–1.030)
Urobilinogen, UA: 0.2 mg/dL (ref 0.0–1.0)
pH: 7.5 (ref 5.0–8.0)

## 2012-05-08 LAB — LIPID PANEL
Cholesterol: 151 mg/dL (ref 0–200)
HDL: 50 mg/dL (ref >39)
LDL Cholesterol: 82 mg/dL (ref 0–99)
Total CHOL/HDL Ratio: 3 RATIO
Triglycerides: 95 mg/dL (ref <150)
VLDL: 19 mg/dL (ref 0–40)

## 2012-05-08 LAB — POCT I-STAT 3, ART BLOOD GAS (G3+)
Acid-base deficit: 1 mmol/L (ref 0.0–2.0)
O2 Saturation: 97 %
pO2, Arterial: 91 mmHg (ref 80.0–100.0)

## 2012-05-08 LAB — COMPREHENSIVE METABOLIC PANEL
ALT: 40 U/L (ref 0–53)
AST: 35 U/L (ref 0–37)
Albumin: 3.3 g/dL — ABNORMAL LOW (ref 3.5–5.2)
Alkaline Phosphatase: 86 U/L (ref 39–117)
BUN: 8 mg/dL (ref 6–23)
CO2: 25 mEq/L (ref 19–32)
Calcium: 8.9 mg/dL (ref 8.4–10.5)
Chloride: 111 mEq/L (ref 96–112)
Creatinine, Ser: 0.88 mg/dL (ref 0.50–1.35)
GFR calc Af Amer: >90 mL/min (ref >90)
GFR calc non Af Amer: 85 mL/min — ABNORMAL LOW (ref >90)
Glucose, Bld: 93 mg/dL (ref 70–99)
Potassium: 4 mEq/L (ref 3.5–5.1)
Sodium: 142 mEq/L (ref 135–145)
Total Bilirubin: 0.4 mg/dL (ref 0.3–1.2)
Total Protein: 6.1 g/dL (ref 6.0–8.3)

## 2012-05-08 LAB — CBC
HCT: 33.8 % — ABNORMAL LOW (ref 39.0–52.0)
Hemoglobin: 10.6 g/dL — ABNORMAL LOW (ref 13.0–17.0)
MCH: 23.8 pg — ABNORMAL LOW (ref 26.0–34.0)
MCHC: 31.4 g/dL (ref 30.0–36.0)
MCV: 76 fL — ABNORMAL LOW (ref 78.0–100.0)
Platelets: 267 10*3/uL (ref 150–400)
RBC: 4.45 MIL/uL (ref 4.22–5.81)
RDW: 16.6 % — ABNORMAL HIGH (ref 11.5–15.5)
WBC: 5.6 10*3/uL (ref 4.0–10.5)

## 2012-05-08 LAB — PROTIME-INR
INR: 1.16 (ref 0.00–1.49)
Prothrombin Time: 14.6 seconds (ref 11.6–15.2)

## 2012-05-08 LAB — HEMOGLOBIN A1C
Hgb A1c MFr Bld: 6 % — ABNORMAL HIGH (ref <5.7)
Mean Plasma Glucose: 126 mg/dL — ABNORMAL HIGH (ref <117)

## 2012-05-08 LAB — APTT: aPTT: 49 seconds — ABNORMAL HIGH (ref 24–37)

## 2012-05-08 MED ORDER — SODIUM CHLORIDE 0.9 % IV SOLN
INTRAVENOUS | Status: DC
  Administered 2012-05-09: 03:00:00 via INTRAVENOUS

## 2012-05-08 NOTE — Progress Notes (Signed)
SUBJECTIVE:  No further chest pain  OBJECTIVE:   Vitals:   Filed Vitals:   05/08/12 0323 05/08/12 0400 05/08/12 0746 05/08/12 0800  BP:  144/81  135/75  Pulse:  70  81  Temp: 98.3 F (36.8 C)  98.6 F (37 C)   TempSrc: Oral  Oral   Resp:      Height:      Weight:  70.6 kg (155 lb 10.3 oz)    SpO2:  98%  98%   I&O's:   Intake/Output Summary (Last 24 hours) at 05/08/12 4098 Last data filed at 05/08/12 0800  Gross per 24 hour  Intake 4220.83 ml  Output   2650 ml  Net 1570.83 ml   TELEMETRY: Reviewed telemetry pt in NSR:     PHYSICAL EXAM General: Well developed, well nourished, in no acute distress Head: Eyes PERRLA, No xanthomas.   Normal cephalic and atramatic  Lungs:   Clear bilaterally to auscultation and percussion. Heart:   HRRR S1 S2 Pulses are 2+ & equal. Abdomen: Bowel sounds are positive, abdomen soft and non-tender without masses  Extremities:   No clubbing, cyanosis or edema.  DP +1 Neuro: Alert and oriented X 3. Psych:  Good affect, responds appropriately   LABS: Basic Metabolic Panel:  Basename 05/08/12 0610  NA 142  K 4.0  CL 111  CO2 25  GLUCOSE 93  BUN 8  CREATININE 0.88  CALCIUM 8.9  MG --  PHOS --   Liver Function Tests:  [facility] 05/08/12 0610  AST 35  ALT 40  ALKPHOS 86  BILITOT 0.4  PROT 6.1  ALBUMIN 3.3*   No results found for this basename: LIPASE:2,AMYLASE:2 in the last 72 hours CBC:  Basename 05/08/12 0610  WBC 5.6  NEUTROABS --  HGB 10.6*  HCT 33.8*  MCV 76.0*  PLT 267   Fasting Lipid Panel:  Basename 05/08/12 0610  CHOL 151  HDL 50  LDLCALC 82  TRIG 95  CHOLHDL 3.0  LDLDIRECT --   Thyroid Function Tests: No results found for this basename: TSH,T4TOTAL,FREET3,T3FREE,THYROIDAB in the last 72 hours Anemia Panel: No results found for this basename: VITAMINB12,FOLATE,FERRITIN,TIBC,IRON,RETICCTPCT in the last 72 hours Coag Panel:   Lab Results  Component Value Date   INR 1.16 05/08/2012   INR 1.20  03/12/2010    RADIOLOGY: Dg Chest 2 View  04/21/2012  *RADIOLOGY REPORT*  Clinical Data: - Chest pain.  Syncope.  CHEST - 2 VIEW  Comparison: None.  Findings: Cardiomediastinal silhouette unremarkable.   Lungs clear. Bronchovascular markings normal.  Pulmonary vascularity normal.  No pneumothorax.  No pleural effusions.  Degenerative changes and DISH involving the thoracic spine.  IMPRESSION: No acute cardiopulmonary disease.   Original Report Authenticated By: Hulan Saas, M.D.       ASSESSMENT:  1.  CAD with ostial LAD and ramus obstructive disease, moderate CAD in prox left circ and RCA 2.  Normal LVF  PLAN:   CABG Monday Continue IV NTG gtt/ASA/Lovenox  Quintella Reichert, MD  05/08/2012  9:38 AM

## 2012-05-09 ENCOUNTER — Inpatient Hospital Stay (HOSPITAL_COMMUNITY): Payer: Medicare Other

## 2012-05-09 DIAGNOSIS — I2510 Atherosclerotic heart disease of native coronary artery without angina pectoris: Secondary | ICD-10-CM

## 2012-05-09 LAB — TYPE AND SCREEN
ABO/RH(D): O POS
Antibody Screen: NEGATIVE

## 2012-05-09 LAB — ABO/RH: ABO/RH(D): O POS

## 2012-05-09 MED ORDER — DOPAMINE-DEXTROSE 3.2-5 MG/ML-% IV SOLN: 2.0000 ug/kg/min | INTRAVENOUS | Status: DC

## 2012-05-09 MED ORDER — DEXTROSE 5 % IV SOLN
1.5000 g | INTRAVENOUS | Status: AC
  Administered 2012-05-10: 1.5 g via INTRAVENOUS
  Administered 2012-05-10: .75 g via INTRAVENOUS
  Filled 2012-05-09: qty 1.5

## 2012-05-09 MED ORDER — DEXTROSE 5 % IV SOLN
750.0000 mg | INTRAVENOUS | Status: DC
  Filled 2012-05-09: qty 750

## 2012-05-09 MED ORDER — TEMAZEPAM 15 MG PO CAPS
15.0000 mg | ORAL_CAPSULE | Freq: Once | ORAL | Status: AC | PRN
  Administered 2012-05-09: 15 mg via ORAL
  Filled 2012-05-09: qty 1

## 2012-05-09 MED ORDER — EPINEPHRINE HCL 1 MG/ML IJ SOLN
0.5000 ug/min | INTRAVENOUS | Status: DC
  Filled 2012-05-09: qty 4

## 2012-05-09 MED ORDER — SODIUM CHLORIDE 0.9 % IV SOLN
INTRAVENOUS | Status: DC
  Filled 2012-05-09: qty 40

## 2012-05-09 MED ORDER — PLASMA-LYTE 148 IV SOLN
INTRAVENOUS | Status: AC
  Administered 2012-05-10: 09:00:00
  Filled 2012-05-09: qty 2.5

## 2012-05-09 MED ORDER — MAGNESIUM SULFATE 50 % IJ SOLN
40.0000 meq | INTRAMUSCULAR | Status: DC
  Filled 2012-05-09: qty 10

## 2012-05-09 MED ORDER — POTASSIUM CHLORIDE 2 MEQ/ML IV SOLN
80.0000 meq | INTRAVENOUS | Status: DC
  Filled 2012-05-09: qty 40

## 2012-05-09 MED ORDER — PHENYLEPHRINE HCL 10 MG/ML IJ SOLN
30.0000 ug/min | INTRAVENOUS | Status: DC
  Filled 2012-05-09: qty 2

## 2012-05-09 MED ORDER — SODIUM CHLORIDE 0.9 % IV SOLN
INTRAVENOUS | Status: AC
  Administered 2012-05-10: 1.7 [IU]/h via INTRAVENOUS
  Filled 2012-05-09: qty 1

## 2012-05-09 MED ORDER — VANCOMYCIN HCL 1000 MG IV SOLR
1250.0000 mg | INTRAVENOUS | Status: AC
  Administered 2012-05-10: 1250 mg via INTRAVENOUS
  Filled 2012-05-09: qty 1250

## 2012-05-09 MED ORDER — NITROGLYCERIN IN D5W 200-5 MCG/ML-% IV SOLN
2.0000 ug/min | INTRAVENOUS | Status: AC
  Administered 2012-05-10: 5 ug/min via INTRAVENOUS
  Filled 2012-05-09: qty 250

## 2012-05-09 MED ORDER — DEXMEDETOMIDINE HCL IN NACL 400 MCG/100ML IV SOLN
0.1000 ug/kg/h | INTRAVENOUS | Status: DC
  Filled 2012-05-09: qty 100

## 2012-05-09 MED ORDER — SODIUM CHLORIDE 0.9 % IV SOLN
INTRAVENOUS | Status: AC
  Administered 2012-05-10: 70 mL/h via INTRAVENOUS
  Filled 2012-05-09: qty 40

## 2012-05-09 MED ORDER — NITROGLYCERIN IN D5W 200-5 MCG/ML-% IV SOLN: 2.0000 ug/min | INTRAVENOUS | Status: DC

## 2012-05-09 MED ORDER — DOPAMINE-DEXTROSE 3.2-5 MG/ML-% IV SOLN
2.0000 ug/kg/min | INTRAVENOUS | Status: DC
  Filled 2012-05-09: qty 250

## 2012-05-09 MED ORDER — SODIUM CHLORIDE 0.9 % IV SOLN
INTRAVENOUS | Status: DC
  Filled 2012-05-09: qty 1

## 2012-05-09 MED ORDER — CHLORHEXIDINE GLUCONATE 4 % EX LIQD
60.0000 mL | Freq: Once | CUTANEOUS | Status: AC
  Administered 2012-05-10: 4 via TOPICAL
  Filled 2012-05-09: qty 60

## 2012-05-09 MED ORDER — PLASMA-LYTE 148 IV SOLN
INTRAVENOUS | Status: DC
  Filled 2012-05-09: qty 2.5

## 2012-05-09 MED ORDER — BISACODYL 5 MG PO TBEC
5.0000 mg | DELAYED_RELEASE_TABLET | Freq: Once | ORAL | Status: AC
  Administered 2012-05-09: 5 mg via ORAL
  Filled 2012-05-09: qty 1

## 2012-05-09 MED ORDER — DEXMEDETOMIDINE HCL IN NACL 400 MCG/100ML IV SOLN
0.1000 ug/kg/h | INTRAVENOUS | Status: AC
  Administered 2012-05-10: 0.2 ug/kg/h via INTRAVENOUS
  Filled 2012-05-09: qty 100

## 2012-05-09 MED ORDER — METOPROLOL TARTRATE 12.5 MG HALF TABLET
12.5000 mg | ORAL_TABLET | Freq: Once | ORAL | Status: AC
  Administered 2012-05-10: 12.5 mg via ORAL
  Filled 2012-05-09 (×2): qty 1

## 2012-05-09 NOTE — Progress Notes (Signed)
SUBJECTIVE:  No complaints of chest pain  OBJECTIVE:   Vitals:   Filed Vitals:   05/08/12 1911 05/08/12 2312 05/09/12 0404 05/09/12 0748  BP:  129/71 138/74   Pulse:      Temp: 98.8 F (37.1 C) 98.8 F (37.1 C) 98.2 F (36.8 C) 98.5 F (36.9 C)  TempSrc: Oral Oral Oral Oral  Resp:  18 18 18   Height:      Weight:      SpO2: 98% 98% 98% 99%   I&O's:   Intake/Output Summary (Last 24 hours) at 05/09/12 0949 Last data filed at 05/09/12 0600  Gross per 24 hour  Intake 1741.5 ml  Output   3300 ml  Net -1558.5 ml   TELEMETRY: Reviewed telemetry pt in NSR:     PHYSICAL EXAM General: Well developed, well nourished, in no acute distress Head: Eyes PERRLA, No xanthomas.   Normal cephalic and atramatic  Lungs:   Clear bilaterally to auscultation and percussion. Heart:   HRRR S1 S2 Pulses are 2+ & equal. Abdomen: Bowel sounds are positive, abdomen soft and non-tender without masses  Extremities:   No clubbing, cyanosis or edema.  DP +1 Neuro: Alert and oriented X 3. Psych:  Good affect, responds appropriately   LABS: Basic Metabolic Panel:  Basename 05/08/12 0610  NA 142  K 4.0  CL 111  CO2 25  GLUCOSE 93  BUN 8  CREATININE 0.88  CALCIUM 8.9  MG --  PHOS --   Liver Function Tests:  [facility] 05/08/12 0610  AST 35  ALT 40  ALKPHOS 86  BILITOT 0.4  PROT 6.1  ALBUMIN 3.3*   No results found for this basename: LIPASE:2,AMYLASE:2 in the last 72 hours CBC:  Basename 05/08/12 0610  WBC 5.6  NEUTROABS --  HGB 10.6*  HCT 33.8*  MCV 76.0*  PLT 267   Hemoglobin A1C:  Basename 05/08/12 0610  HGBA1C 6.0*   Fasting Lipid Panel:  Basename 05/08/12 0610  CHOL 151  HDL 50  LDLCALC 82  TRIG 95  CHOLHDL 3.0  LDLDIRECT --   Coag Panel:   Lab Results  Component Value Date   INR 1.16 05/08/2012   INR 1.20 03/12/2010    RADIOLOGY: Dg Chest 2 View  04/21/2012  *RADIOLOGY REPORT*  Clinical Data: - Chest pain.  Syncope.  CHEST - 2 VIEW  Comparison: None.   Findings: Cardiomediastinal silhouette unremarkable.   Lungs clear. Bronchovascular markings normal.  Pulmonary vascularity normal.  No pneumothorax.  No pleural effusions.  Degenerative changes and DISH involving the thoracic spine.  IMPRESSION: No acute cardiopulmonary disease.   Original Report Authenticated By: Hulan Saas, M.D.       ASSESSMENT:  1. CAD with ostial LAD and ramus obstructive disease, moderate CAD in prox left circ and RCA  2. Normal LVF   PLAN:   CABG Monday  Continue IV NTG gtt/ASA/Lovenox  Quintella Reichert, MD  05/09/2012  9:49 AM

## 2012-05-09 NOTE — Progress Notes (Signed)
Procedure(s) (LRB): CORONARY ARTERY BYPASS GRAFTING (CABG) (N/A) INTRAOPERATIVE TRANSESOPHAGEAL ECHOCARDIOGRAM (N/A) Subjective: No complaints  Objective: Vital signs in last 24 hours: Temp:  [98.2 F (36.8 C)-98.8 F (37.1 C)] 98.5 F (36.9 C) (11/24 0748) Cardiac Rhythm:  [-] Normal sinus rhythm (11/24 0750) Resp:  [16-18] 18  (11/24 0748) BP: (129-143)/(71-79) 131/77 mmHg (11/24 0750) SpO2:  [96 %-99 %] 99 % (11/24 0748)  Hemodynamic parameters for last 24 hours:    Intake/Output from previous day: 11/23 0701 - 11/24 0700 In: 2121 [P.O.:240; I.V.:1881] Out: 3500 [Urine:3500] Intake/Output this shift: Total I/O In: 86 [I.V.:86] Out: -   General appearance: alert and no distress  Lab Results:  [facility] 05/08/12 0610  WBC 5.6  HGB 10.6*  HCT 33.8*  PLT 267   BMET:  Basename 05/08/12 0610  NA 142  K 4.0  CL 111  CO2 25  GLUCOSE 93  BUN 8  CREATININE 0.88  CALCIUM 8.9    PT/INR:  Basename 05/08/12 0610  LABPROT 14.6  INR 1.16   ABG    Component Value Date/Time   PHART 7.424 05/08/2012 0437   HCO3 22.6 05/08/2012 0437   TCO2 24 05/08/2012 0437   ACIDBASEDEF 1.0 05/08/2012 0437   O2SAT 97.0 05/08/2012 0437   CBG (last 3)  No results found for this basename: GLUCAP:3 in the last 72 hours  Assessment/Plan: S/P Procedure(s) (LRB): CORONARY ARTERY BYPASS GRAFTING (CABG) (N/A) INTRAOPERATIVE TRANSESOPHAGEAL ECHOCARDIOGRAM (N/A) For CABG in AM He is stable. He does not have any unanswered questions For CABG in AM- will hold PM lovenox dose   LOS: 3 days    Rabia Argote C 05/09/2012

## 2012-05-09 NOTE — Progress Notes (Signed)
ANTICOAGULATION CONSULT NOTE - Follow Up Consult  Pharmacy Consult for Lovenox Indication: ACS/pending CABG 11/25  No Known Allergies  Patient Measurements: Height: 5\' 6"  (167.6 cm) Weight: 155 lb 10.3 oz (70.6 kg) IBW/kg (Calculated) : 63.8   Vital Signs: Temp: 98.5 F (36.9 C) (11/24 0748) Temp src: Oral (11/24 0748) BP: 131/77 mmHg (11/24 0750)  Labs:  Basename 05/08/12 0610  HGB 10.6*  HCT 33.8*  PLT 267  APTT 49*  LABPROT 14.6  INR 1.16  HEPARINUNFRC --  CREATININE 0.88  CKTOTAL --  CKMB --  TROPONINI --    Estimated Creatinine Clearance: 70.5 ml/min (by C-G formula based on Cr of 0.88).   Assessment: 89 YOM with CAD, s/p cath 11/21 - Needs CABG - plan for Monday. On Lovenox until CABG. CBC/renal fx stable, No bleeding noted.  Goal of Therapy:  Anti-Xa level 0.6-1.2 units/ml 4hrs after LMWH dose given  Monitor platelets by anticoagulation protocol: Yes  Plan:  - Continue lovenox 70mg  SQ Q 12hrs - Hold lovenox tonight due to CABG tomorrow am  Bayard Hugger, PharmD, BCPS  Clinical Pharmacist  Pager: [phone]

## 2012-05-10 ENCOUNTER — Encounter (HOSPITAL_COMMUNITY)
Admission: AD | Disposition: A | Discharge status: Home / Self Care | Payer: Self-pay | Source: Ambulatory Visit | Attending: Cardiothoracic Surgery

## 2012-05-10 ENCOUNTER — Inpatient Hospital Stay (HOSPITAL_COMMUNITY): Payer: Medicare Other

## 2012-05-10 ENCOUNTER — Inpatient Hospital Stay (HOSPITAL_COMMUNITY): Payer: Medicare Other | Admitting: Anesthesiology

## 2012-05-10 ENCOUNTER — Encounter (HOSPITAL_COMMUNITY): Payer: Self-pay | Admitting: Anesthesiology

## 2012-05-10 HISTORY — PX: INTRAOPERATIVE TRANSESOPHAGEAL ECHOCARDIOGRAM: SHX5062

## 2012-05-10 HISTORY — PX: CORONARY ARTERY BYPASS GRAFT: SHX141

## 2012-05-10 LAB — CBC
HCT: 26.9 % — ABNORMAL LOW (ref 39.0–52.0)
HCT: 26.6 % — ABNORMAL LOW (ref 39.0–52.0)
Hemoglobin: 8.6 g/dL — ABNORMAL LOW (ref 13.0–17.0)
Hemoglobin: 8.3 g/dL — ABNORMAL LOW (ref 13.0–17.0)
MCH: 23.8 pg — ABNORMAL LOW (ref 26.0–34.0)
MCH: 23.9 pg — ABNORMAL LOW (ref 26.0–34.0)
MCHC: 32 g/dL (ref 30.0–36.0)
MCHC: 31.2 g/dL (ref 30.0–36.0)
MCV: 76.4 fL — ABNORMAL LOW (ref 78.0–100.0)
Platelets: 170 10*3/uL (ref 150–400)
RBC: 3.48 MIL/uL — ABNORMAL LOW (ref 4.22–5.81)
RDW: 17.2 % — ABNORMAL HIGH (ref 11.5–15.5)
WBC: 9.4 10*3/uL (ref 4.0–10.5)

## 2012-05-10 LAB — CREATININE, SERUM
Creatinine, Ser: 0.81 mg/dL (ref 0.50–1.35)
GFR calc Af Amer: >90 mL/min (ref >90)
GFR calc non Af Amer: 88 mL/min — ABNORMAL LOW (ref >90)

## 2012-05-10 LAB — POCT I-STAT 4, (NA,K, GLUC, HGB,HCT)
Glucose, Bld: 118 mg/dL — ABNORMAL HIGH (ref 70–99)
Glucose, Bld: 76 mg/dL (ref 70–99)
Glucose, Bld: 98 mg/dL (ref 70–99)
HCT: 31 % — ABNORMAL LOW (ref 39.0–52.0)
HCT: 29 % — ABNORMAL LOW (ref 39.0–52.0)
HCT: 22 % — ABNORMAL LOW (ref 39.0–52.0)
HCT: 24 % — ABNORMAL LOW (ref 39.0–52.0)
Hemoglobin: 10.5 g/dL — ABNORMAL LOW (ref 13.0–17.0)
Hemoglobin: 7.1 g/dL — ABNORMAL LOW (ref 13.0–17.0)
Hemoglobin: 7.5 g/dL — ABNORMAL LOW (ref 13.0–17.0)
Hemoglobin: 8.2 g/dL — ABNORMAL LOW (ref 13.0–17.0)
Potassium: 3.7 mEq/L (ref 3.5–5.1)
Potassium: 3.7 mEq/L (ref 3.5–5.1)
Potassium: 3.8 mEq/L (ref 3.5–5.1)
Sodium: 141 mEq/L (ref 135–145)
Sodium: 137 mEq/L (ref 135–145)
Sodium: 136 mEq/L (ref 135–145)
Sodium: 138 mEq/L (ref 135–145)

## 2012-05-10 LAB — POCT I-STAT 3, ART BLOOD GAS (G3+)
Bicarbonate: 23.4 mEq/L (ref 20.0–24.0)
O2 Saturation: 93 %
O2 Saturation: 98 %
Patient temperature: 38
TCO2: 24 mmol/L (ref 0–100)
TCO2: 25 mmol/L (ref 0–100)
pCO2 arterial: 34.9 mmHg — ABNORMAL LOW (ref 35.0–45.0)
pH, Arterial: 7.404 (ref 7.350–7.450)
pH, Arterial: 7.368 (ref 7.350–7.450)
pO2, Arterial: 62 mmHg — ABNORMAL LOW (ref 80.0–100.0)

## 2012-05-10 LAB — PROTIME-INR: INR: 1.64 — ABNORMAL HIGH (ref 0.00–1.49)

## 2012-05-10 LAB — HEMOGLOBIN AND HEMATOCRIT, BLOOD
HCT: 21.4 % — ABNORMAL LOW (ref 39.0–52.0)
Hemoglobin: 6.8 g/dL — CL (ref 13.0–17.0)

## 2012-05-10 LAB — POCT I-STAT, CHEM 8
Calcium, Ion: 1.14 mmol/L (ref 1.13–1.30)
Chloride: 106 mEq/L (ref 96–112)
Glucose, Bld: 125 mg/dL — ABNORMAL HIGH (ref 70–99)
HCT: 25 % — ABNORMAL LOW (ref 39.0–52.0)
Hemoglobin: 8.5 g/dL — ABNORMAL LOW (ref 13.0–17.0)
TCO2: 22 mmol/L (ref 0–100)

## 2012-05-10 LAB — PLATELET COUNT: Platelets: 145 10*3/uL — ABNORMAL LOW (ref 150–400)

## 2012-05-10 LAB — MAGNESIUM: Magnesium: 2.8 mg/dL — ABNORMAL HIGH (ref 1.5–2.5)

## 2012-05-10 SURGERY — CORONARY ARTERY BYPASS GRAFTING (CABG)
Anesthesia: General | Site: Chest | Wound class: Clean

## 2012-05-10 MED ORDER — ASPIRIN 81 MG PO CHEW: 324.0000 mg | CHEWABLE_TABLET | Freq: Every day | ORAL | Status: DC

## 2012-05-10 MED ORDER — PROTAMINE SULFATE 10 MG/ML IV SOLN
INTRAVENOUS | Status: DC | PRN
  Administered 2012-05-10: 100 mg via INTRAVENOUS
  Administered 2012-05-10: 25 mg via INTRAVENOUS
  Administered 2012-05-10: 80 mg via INTRAVENOUS
  Administered 2012-05-10: 75 mg via INTRAVENOUS

## 2012-05-10 MED ORDER — SODIUM CHLORIDE 0.9 % IJ SOLN
OROMUCOSAL | Status: DC | PRN
  Administered 2012-05-10: 09:00:00 via TOPICAL

## 2012-05-10 MED ORDER — CEFUROXIME SODIUM 1.5 G IJ SOLR: 1.5000 g | INTRAMUSCULAR | Status: DC

## 2012-05-10 MED ORDER — BISACODYL 5 MG PO TBEC
10.0000 mg | DELAYED_RELEASE_TABLET | Freq: Every day | ORAL | Status: DC
  Administered 2012-05-12 – 2012-05-13 (×2): 10 mg via ORAL
  Filled 2012-05-10 (×2): qty 2

## 2012-05-10 MED ORDER — ALBUMIN HUMAN 5 % IV SOLN
250.0000 mL | INTRAVENOUS | Status: AC | PRN
  Administered 2012-05-10 (×2): 250 mL via INTRAVENOUS

## 2012-05-10 MED ORDER — INSULIN ASPART 100 UNIT/ML ~~LOC~~ SOLN
0.0000 [IU] | SUBCUTANEOUS | Status: AC
  Administered 2012-05-10: 2 [IU] via SUBCUTANEOUS

## 2012-05-10 MED ORDER — LACTATED RINGERS IV SOLN
INTRAVENOUS | Status: DC | PRN
  Administered 2012-05-10: 07:00:00 via INTRAVENOUS

## 2012-05-10 MED ORDER — PROPOFOL 10 MG/ML IV BOLUS
INTRAVENOUS | Status: DC | PRN
  Administered 2012-05-10: 25 mg via INTRAVENOUS

## 2012-05-10 MED ORDER — HEMOSTATIC AGENTS (NO CHARGE) OPTIME
TOPICAL | Status: DC | PRN
  Administered 2012-05-10: 1 via TOPICAL

## 2012-05-10 MED ORDER — INSULIN REGULAR BOLUS VIA INFUSION
0.0000 [IU] | Freq: Three times a day (TID) | INTRAVENOUS | Status: DC
  Filled 2012-05-10: qty 10

## 2012-05-10 MED ORDER — METOPROLOL TARTRATE 1 MG/ML IV SOLN: 2.5000 mg | INTRAVENOUS | Status: DC | PRN

## 2012-05-10 MED ORDER — ACETAMINOPHEN 500 MG PO TABS
1000.0000 mg | ORAL_TABLET | Freq: Four times a day (QID) | ORAL | Status: DC
  Administered 2012-05-10 – 2012-05-11 (×2): 1000 mg via ORAL
  Filled 2012-05-10 (×10): qty 2

## 2012-05-10 MED ORDER — POTASSIUM CHLORIDE 10 MEQ/50ML IV SOLN
10.0000 meq | INTRAVENOUS | Status: AC
  Administered 2012-05-10 (×3): 10 meq via INTRAVENOUS

## 2012-05-10 MED ORDER — METOCLOPRAMIDE HCL 5 MG/ML IJ SOLN
10.0000 mg | Freq: Four times a day (QID) | INTRAMUSCULAR | Status: DC
  Filled 2012-05-10 (×3): qty 2

## 2012-05-10 MED ORDER — MUPIROCIN 2 % EX OINT
1.0000 "application " | TOPICAL_OINTMENT | Freq: Two times a day (BID) | CUTANEOUS | Status: DC
  Administered 2012-05-10 – 2012-05-14 (×7): 1 via NASAL
  Filled 2012-05-10: qty 22

## 2012-05-10 MED ORDER — LACTATED RINGERS IV SOLN: 500.0000 mL | Freq: Once | INTRAVENOUS | Status: AC | PRN

## 2012-05-10 MED ORDER — ONDANSETRON HCL 4 MG/2ML IJ SOLN
4.0000 mg | Freq: Four times a day (QID) | INTRAMUSCULAR | Status: DC | PRN
  Administered 2012-05-11 (×2): 4 mg via INTRAVENOUS
  Filled 2012-05-10 (×2): qty 2

## 2012-05-10 MED ORDER — METOPROLOL TARTRATE 25 MG/10 ML ORAL SUSPENSION
12.5000 mg | Freq: Two times a day (BID) | ORAL | Status: DC
  Filled 2012-05-10 (×5): qty 5

## 2012-05-10 MED ORDER — SODIUM CHLORIDE 0.9 % IJ SOLN
3.0000 mL | Freq: Two times a day (BID) | INTRAMUSCULAR | Status: DC
  Administered 2012-05-11 (×2): 3 mL via INTRAVENOUS

## 2012-05-10 MED ORDER — ASPIRIN EC 325 MG PO TBEC
325.0000 mg | DELAYED_RELEASE_TABLET | Freq: Every day | ORAL | Status: DC
  Administered 2012-05-11 – 2012-05-14 (×3): 325 mg via ORAL
  Filled 2012-05-10 (×4): qty 1

## 2012-05-10 MED ORDER — MORPHINE SULFATE 2 MG/ML IJ SOLN
1.0000 mg | INTRAMUSCULAR | Status: AC | PRN
  Administered 2012-05-10: 2 mg via INTRAVENOUS
  Filled 2012-05-10: qty 1

## 2012-05-10 MED ORDER — ROCURONIUM BROMIDE 100 MG/10ML IV SOLN
INTRAVENOUS | Status: DC | PRN
  Administered 2012-05-10: 60 mg via INTRAVENOUS
  Administered 2012-05-10: 40 mg via INTRAVENOUS

## 2012-05-10 MED ORDER — DOCUSATE SODIUM 100 MG PO CAPS
200.0000 mg | ORAL_CAPSULE | Freq: Every day | ORAL | Status: DC
  Administered 2012-05-12 – 2012-05-13 (×2): 200 mg via ORAL
  Filled 2012-05-10 (×3): qty 2

## 2012-05-10 MED ORDER — SODIUM CHLORIDE 0.9 % IV SOLN: 250.0000 mL | INTRAVENOUS | Status: DC

## 2012-05-10 MED ORDER — VECURONIUM BROMIDE 10 MG IV SOLR
INTRAVENOUS | Status: DC | PRN
  Administered 2012-05-10 (×2): 5 mg via INTRAVENOUS

## 2012-05-10 MED ORDER — MIDAZOLAM HCL 5 MG/5ML IJ SOLN
INTRAMUSCULAR | Status: DC | PRN
  Administered 2012-05-10: 5 mg via INTRAVENOUS
  Administered 2012-05-10: 2 mg via INTRAVENOUS
  Administered 2012-05-10 (×2): 5 mg via INTRAVENOUS
  Administered 2012-05-10: 3 mg via INTRAVENOUS

## 2012-05-10 MED ORDER — FENTANYL CITRATE 0.05 MG/ML IJ SOLN
INTRAMUSCULAR | Status: DC | PRN
  Administered 2012-05-10: 250 ug via INTRAVENOUS
  Administered 2012-05-10 (×2): 100 ug via INTRAVENOUS
  Administered 2012-05-10: 250 ug via INTRAVENOUS
  Administered 2012-05-10: 400 ug via INTRAVENOUS
  Administered 2012-05-10 (×2): 250 ug via INTRAVENOUS
  Administered 2012-05-10: 50 ug via INTRAVENOUS
  Administered 2012-05-10: 250 ug via INTRAVENOUS
  Administered 2012-05-10: 100 ug via INTRAVENOUS

## 2012-05-10 MED ORDER — ARTIFICIAL TEARS OP OINT
TOPICAL_OINTMENT | OPHTHALMIC | Status: DC | PRN
  Administered 2012-05-10: 1 via OPHTHALMIC

## 2012-05-10 MED ORDER — PHENYLEPHRINE HCL 10 MG/ML IJ SOLN
INTRAMUSCULAR | Status: DC | PRN
  Administered 2012-05-10: 40 ug via INTRAVENOUS
  Administered 2012-05-10: 80 ug via INTRAVENOUS

## 2012-05-10 MED ORDER — SODIUM CHLORIDE 0.9 % IV SOLN
INTRAVENOUS | Status: DC
  Filled 2012-05-10: qty 1

## 2012-05-10 MED ORDER — INSULIN ASPART 100 UNIT/ML ~~LOC~~ SOLN
0.0000 [IU] | SUBCUTANEOUS | Status: DC
  Administered 2012-05-10 – 2012-05-11 (×4): 2 [IU] via SUBCUTANEOUS

## 2012-05-10 MED ORDER — ALBUMIN HUMAN 25 % IV SOLN
INTRAVENOUS | Status: DC | PRN
  Administered 2012-05-10 (×2): via INTRAVENOUS

## 2012-05-10 MED ORDER — CHLORHEXIDINE GLUCONATE CLOTH 2 % EX PADS
6.0000 | MEDICATED_PAD | Freq: Every day | CUTANEOUS | Status: DC
  Administered 2012-05-10 – 2012-05-11 (×2): 6 via TOPICAL

## 2012-05-10 MED ORDER — PHENYLEPHRINE HCL 10 MG/ML IJ SOLN
0.0000 ug/min | INTRAVENOUS | Status: DC
  Filled 2012-05-10: qty 2

## 2012-05-10 MED ORDER — MAGNESIUM SULFATE 40 MG/ML IJ SOLN
4.0000 g | Freq: Once | INTRAMUSCULAR | Status: AC
  Administered 2012-05-10: 4 g via INTRAVENOUS
  Filled 2012-05-10: qty 100

## 2012-05-10 MED ORDER — SODIUM CHLORIDE 0.9 % IV SOLN
INTRAVENOUS | Status: DC
  Administered 2012-05-10: 20 mL/h via INTRAVENOUS

## 2012-05-10 MED ORDER — PANTOPRAZOLE SODIUM 40 MG PO TBEC
40.0000 mg | DELAYED_RELEASE_TABLET | Freq: Every day | ORAL | Status: DC
  Administered 2012-05-12 – 2012-05-13 (×2): 40 mg via ORAL
  Filled 2012-05-10 (×2): qty 1

## 2012-05-10 MED ORDER — DEXTROSE 5 % IV SOLN
1.5000 g | Freq: Two times a day (BID) | INTRAVENOUS | Status: DC
  Administered 2012-05-10 – 2012-05-11 (×3): 1.5 g via INTRAVENOUS
  Filled 2012-05-10 (×5): qty 1.5

## 2012-05-10 MED ORDER — LACTATED RINGERS IV SOLN
INTRAVENOUS | Status: DC | PRN
  Administered 2012-05-10 (×2): via INTRAVENOUS

## 2012-05-10 MED ORDER — HEPARIN SODIUM (PORCINE) 1000 UNIT/ML IJ SOLN
INTRAMUSCULAR | Status: DC | PRN
  Administered 2012-05-10: 24000 [IU] via INTRAVENOUS

## 2012-05-10 MED ORDER — AMINOCAPROIC ACID 250 MG/ML IV SOLN: INTRAVENOUS | Status: DC | PRN

## 2012-05-10 MED ORDER — ACETAMINOPHEN 10 MG/ML IV SOLN
1000.0000 mg | Freq: Once | INTRAVENOUS | Status: AC
  Administered 2012-05-10: 1000 mg via INTRAVENOUS
  Filled 2012-05-10: qty 100

## 2012-05-10 MED ORDER — METOCLOPRAMIDE HCL 5 MG/ML IJ SOLN
10.0000 mg | Freq: Four times a day (QID) | INTRAMUSCULAR | Status: AC
  Administered 2012-05-10 – 2012-05-11 (×3): 10 mg via INTRAVENOUS
  Filled 2012-05-10 (×3): qty 2

## 2012-05-10 MED ORDER — METOPROLOL TARTRATE 12.5 MG HALF TABLET
12.5000 mg | ORAL_TABLET | Freq: Two times a day (BID) | ORAL | Status: DC
  Filled 2012-05-10 (×5): qty 1

## 2012-05-10 MED ORDER — MORPHINE SULFATE 2 MG/ML IJ SOLN
2.0000 mg | INTRAMUSCULAR | Status: DC | PRN
  Administered 2012-05-11 (×3): 2 mg via INTRAVENOUS
  Filled 2012-05-10 (×3): qty 1

## 2012-05-10 MED ORDER — SODIUM CHLORIDE 0.9 % IJ SOLN: 3.0000 mL | INTRAMUSCULAR | Status: DC | PRN

## 2012-05-10 MED ORDER — FAMOTIDINE IN NACL 20-0.9 MG/50ML-% IV SOLN: 20.0000 mg | Freq: Two times a day (BID) | INTRAVENOUS | Status: AC

## 2012-05-10 MED ORDER — FAMOTIDINE IN NACL 20-0.9 MG/50ML-% IV SOLN
20.0000 mg | Freq: Two times a day (BID) | INTRAVENOUS | Status: DC
  Administered 2012-05-10: 20 mg via INTRAVENOUS

## 2012-05-10 MED ORDER — SODIUM CHLORIDE 0.45 % IV SOLN
INTRAVENOUS | Status: DC
  Administered 2012-05-10: 20 mL/h via INTRAVENOUS

## 2012-05-10 MED ORDER — VANCOMYCIN HCL IN DEXTROSE 1-5 GM/200ML-% IV SOLN
1000.0000 mg | Freq: Once | INTRAVENOUS | Status: AC
  Administered 2012-05-10: 1000 mg via INTRAVENOUS
  Filled 2012-05-10: qty 200

## 2012-05-10 MED ORDER — LACTATED RINGERS IV SOLN
INTRAVENOUS | Status: DC
  Administered 2012-05-10 – 2012-05-11 (×2): 20 mL/h via INTRAVENOUS

## 2012-05-10 MED ORDER — OXYCODONE HCL 5 MG PO TABS
5.0000 mg | ORAL_TABLET | ORAL | Status: DC | PRN
  Administered 2012-05-11 (×2): 5 mg via ORAL
  Administered 2012-05-11 (×2): 10 mg via ORAL
  Filled 2012-05-10 (×2): qty 2
  Filled 2012-05-10 (×2): qty 1

## 2012-05-10 MED ORDER — 0.9 % SODIUM CHLORIDE (POUR BTL) OPTIME
TOPICAL | Status: DC | PRN
  Administered 2012-05-10: 6000 mL

## 2012-05-10 MED ORDER — ACETAMINOPHEN 160 MG/5ML PO SOLN: 975.0000 mg | Freq: Four times a day (QID) | ORAL | Status: DC

## 2012-05-10 MED ORDER — MIDAZOLAM HCL 2 MG/2ML IJ SOLN: 2.0000 mg | INTRAMUSCULAR | Status: DC | PRN

## 2012-05-10 MED ORDER — VANCOMYCIN HCL 1000 MG IV SOLR: 1250.0000 mg | INTRAVENOUS | Status: DC

## 2012-05-10 MED ORDER — BISACODYL 10 MG RE SUPP: 10.0000 mg | Freq: Every day | RECTAL | Status: DC

## 2012-05-10 MED ORDER — NITROGLYCERIN IN D5W 200-5 MCG/ML-% IV SOLN
0.0000 ug/min | INTRAVENOUS | Status: DC
  Administered 2012-05-10: 15 ug/min via INTRAVENOUS

## 2012-05-10 MED ORDER — DEXMEDETOMIDINE HCL IN NACL 200 MCG/50ML IV SOLN
0.1000 ug/kg/h | INTRAVENOUS | Status: DC
  Administered 2012-05-10: 0.7 ug/kg/h via INTRAVENOUS

## 2012-05-10 SURGICAL SUPPLY — 111 items
ATTRACTOMAT 16X20 MAGNETIC DRP (DRAPES) ×3 IMPLANT
BAG DECANTER FOR FLEXI CONT (MISCELLANEOUS) ×3 IMPLANT
BANDAGE ELASTIC 4 VELCRO ST LF (GAUZE/BANDAGES/DRESSINGS) ×6 IMPLANT
BANDAGE ELASTIC 6 VELCRO ST LF (GAUZE/BANDAGES/DRESSINGS) ×6 IMPLANT
BANDAGE GAUZE ELAST BULKY 4 IN (GAUZE/BANDAGES/DRESSINGS) ×6 IMPLANT
BENZOIN TINCTURE PRP APPL 2/3 (GAUZE/BANDAGES/DRESSINGS) ×6 IMPLANT
BLADE STERNUM SYSTEM 6 (BLADE) ×3 IMPLANT
BLADE SURG 11 STRL SS (BLADE) ×3 IMPLANT
BLADE SURG ROTATE 9660 (MISCELLANEOUS) ×3 IMPLANT
CANISTER SUCTION 2500CC (MISCELLANEOUS) ×3 IMPLANT
CANN PRFSN .5XCNCT 15X34-48 (MISCELLANEOUS) ×2
CANNULA AORTIC HI-FLOW 6.5M20F (CANNULA) ×3 IMPLANT
CANNULA PRFSN .5XCNCT 15X34-48 (MISCELLANEOUS) ×2 IMPLANT
CANNULA VEN 2 STAGE (MISCELLANEOUS) ×1
CATH CPB KIT GERHARDT (MISCELLANEOUS) ×3 IMPLANT
CATH THORACIC 28FR (CATHETERS) ×3 IMPLANT
CATH THORACIC 36FR (CATHETERS) IMPLANT
CATH THORACIC 36FR RT ANG (CATHETERS) IMPLANT
CLIP TI MEDIUM 24 (CLIP) IMPLANT
CLIP TI WIDE RED SMALL 24 (CLIP) IMPLANT
CLOTH BEACON ORANGE TIMEOUT ST (SAFETY) ×3 IMPLANT
CLSR STERI-STRIP ANTIMIC 1/2X4 (GAUZE/BANDAGES/DRESSINGS) ×6 IMPLANT
COVER SURGICAL LIGHT HANDLE (MISCELLANEOUS) ×3 IMPLANT
CRADLE DONUT ADULT HEAD (MISCELLANEOUS) ×3 IMPLANT
DRAIN CHANNEL 28F RND 3/8 FF (WOUND CARE) ×3 IMPLANT
DRAPE CARDIOVASCULAR INCISE (DRAPES) ×1
DRAPE SLUSH/WARMER DISC (DRAPES) ×3 IMPLANT
DRAPE SRG 135X102X78XABS (DRAPES) ×2 IMPLANT
DRSG COVADERM 4X14 (GAUZE/BANDAGES/DRESSINGS) ×3 IMPLANT
ELECT BLADE 4.0 EZ CLEAN MEGAD (MISCELLANEOUS) ×3
ELECT CAUTERY BLADE 6.4 (BLADE) ×3 IMPLANT
ELECT REM PT RETURN 9FT ADLT (ELECTROSURGICAL) ×6
ELECTRODE BLDE 4.0 EZ CLN MEGD (MISCELLANEOUS) ×2 IMPLANT
ELECTRODE REM PT RTRN 9FT ADLT (ELECTROSURGICAL) ×4 IMPLANT
GLOVE BIO SURGEON STRL SZ 6 (GLOVE) ×6 IMPLANT
GLOVE BIO SURGEON STRL SZ 6.5 (GLOVE) ×12 IMPLANT
GLOVE BIO SURGEON STRL SZ7 (GLOVE) IMPLANT
GLOVE BIO SURGEON STRL SZ7.5 (GLOVE) IMPLANT
GLOVE BIOGEL PI IND STRL 6 (GLOVE) ×6 IMPLANT
GLOVE BIOGEL PI IND STRL 6.5 (GLOVE) IMPLANT
GLOVE BIOGEL PI IND STRL 7.0 (GLOVE) ×2 IMPLANT
GLOVE BIOGEL PI INDICATOR 6 (GLOVE) ×3
GLOVE BIOGEL PI INDICATOR 6.5 (GLOVE)
GLOVE BIOGEL PI INDICATOR 7.0 (GLOVE) ×1
GOWN EXTRA PROTECTION XL (GOWNS) ×3 IMPLANT
GOWN STRL NON-REIN LRG LVL3 (GOWN DISPOSABLE) ×21 IMPLANT
HEMOSTAT POWDER SURGIFOAM 1G (HEMOSTASIS) ×9 IMPLANT
HEMOSTAT SURGICEL 2X14 (HEMOSTASIS) ×3 IMPLANT
INSERT FOGARTY 61MM (MISCELLANEOUS) IMPLANT
INSERT FOGARTY XLG (MISCELLANEOUS) IMPLANT
KIT BASIN OR (CUSTOM PROCEDURE TRAY) ×3 IMPLANT
KIT ROOM TURNOVER OR (KITS) ×3 IMPLANT
KIT SUCTION CATH 14FR (SUCTIONS) ×6 IMPLANT
KIT VASOVIEW W/TROCAR VH 2000 (KITS) ×3 IMPLANT
LEAD PACING MYOCARDI (MISCELLANEOUS) ×3 IMPLANT
MARKER GRAFT CORONARY BYPASS (MISCELLANEOUS) ×9 IMPLANT
NS IRRIG 1000ML POUR BTL (IV SOLUTION) ×18 IMPLANT
PACK OPEN HEART (CUSTOM PROCEDURE TRAY) ×3 IMPLANT
PAD ARMBOARD 7.5X6 YLW CONV (MISCELLANEOUS) ×6 IMPLANT
PAD ELECT DEFIB RADIOL ZOLL (MISCELLANEOUS) ×3 IMPLANT
PENCIL BUTTON HOLSTER BLD 10FT (ELECTRODE) ×3 IMPLANT
PUNCH AORTIC ROTATE 4.0MM (MISCELLANEOUS) IMPLANT
PUNCH AORTIC ROTATE 4.5MM 8IN (MISCELLANEOUS) ×3 IMPLANT
PUNCH AORTIC ROTATE 5MM 8IN (MISCELLANEOUS) IMPLANT
SOLUTION ANTI FOG 6CC (MISCELLANEOUS) IMPLANT
SPONGE GAUZE 4X4 12PLY (GAUZE/BANDAGES/DRESSINGS) ×12 IMPLANT
SPONGE LAP 18X18 X RAY DECT (DISPOSABLE) ×3 IMPLANT
SPONGE LAP 4X18 X RAY DECT (DISPOSABLE) IMPLANT
SUT BONE WAX W31G (SUTURE) ×3 IMPLANT
SUT MNCRL AB 4-0 PS2 18 (SUTURE) ×3 IMPLANT
SUT PROLENE 3 0 SH DA (SUTURE) IMPLANT
SUT PROLENE 3 0 SH1 36 (SUTURE) ×3 IMPLANT
SUT PROLENE 4 0 RB 1 (SUTURE)
SUT PROLENE 4 0 SH DA (SUTURE) IMPLANT
SUT PROLENE 4 0 TF (SUTURE) ×6 IMPLANT
SUT PROLENE 4-0 RB1 .5 CRCL 36 (SUTURE) IMPLANT
SUT PROLENE 5 0 C 1 36 (SUTURE) IMPLANT
SUT PROLENE 6 0 C 1 30 (SUTURE) IMPLANT
SUT PROLENE 6 0 CC (SUTURE) ×6 IMPLANT
SUT PROLENE 7 0 BV 1 (SUTURE) ×9 IMPLANT
SUT PROLENE 7 0 BV1 MDA (SUTURE) ×6 IMPLANT
SUT PROLENE 7.0 RB 3 (SUTURE) IMPLANT
SUT PROLENE 8 0 BV175 6 (SUTURE) ×9 IMPLANT
SUT SILK  1 MH (SUTURE)
SUT SILK 1 MH (SUTURE) IMPLANT
SUT SILK 2 0 SH CR/8 (SUTURE) IMPLANT
SUT SILK 3 0 SH CR/8 (SUTURE) IMPLANT
SUT STEEL 6MS V (SUTURE) ×3 IMPLANT
SUT STEEL STERNAL CCS#1 18IN (SUTURE) IMPLANT
SUT STEEL SZ 6 DBL 3X14 BALL (SUTURE) ×3 IMPLANT
SUT VIC AB 1 CTX 18 (SUTURE) ×6 IMPLANT
SUT VIC AB 1 CTX 36 (SUTURE)
SUT VIC AB 1 CTX36XBRD ANBCTR (SUTURE) IMPLANT
SUT VIC AB 2-0 CT1 27 (SUTURE) ×1
SUT VIC AB 2-0 CT1 TAPERPNT 27 (SUTURE) ×2 IMPLANT
SUT VIC AB 2-0 CTX 27 (SUTURE) IMPLANT
SUT VIC AB 3-0 SH 27 (SUTURE)
SUT VIC AB 3-0 SH 27X BRD (SUTURE) IMPLANT
SUT VIC AB 3-0 X1 27 (SUTURE) IMPLANT
SUT VICRYL 4-0 PS2 18IN ABS (SUTURE) IMPLANT
SUTURE E-PAK OPEN HEART (SUTURE) ×3 IMPLANT
SYSTEM SAHARA CHEST DRAIN ATS (WOUND CARE) ×3 IMPLANT
TAPE CLOTH SURG 4X10 WHT LF (GAUZE/BANDAGES/DRESSINGS) ×6 IMPLANT
TOWEL OR 17X24 6PK STRL BLUE (TOWEL DISPOSABLE) ×6 IMPLANT
TOWEL OR 17X26 10 PK STRL BLUE (TOWEL DISPOSABLE) ×6 IMPLANT
TRAY FOLEY IC TEMP SENS 14FR (CATHETERS) ×3 IMPLANT
TUBE FEEDING 8FR 16IN STR KANG (MISCELLANEOUS) ×3 IMPLANT
TUBE SUCT INTRACARD DLP 20F (MISCELLANEOUS) ×3 IMPLANT
TUBING INSUFFLATION 10FT LAP (TUBING) ×3 IMPLANT
UNDERPAD 30X30 INCONTINENT (UNDERPADS AND DIAPERS) ×3 IMPLANT
WATER STERILE IRR 1000ML POUR (IV SOLUTION) ×6 IMPLANT

## 2012-05-10 NOTE — Anesthesia Preprocedure Evaluation (Addendum)
Anesthesia Evaluation  Patient identified by MRN, date of birth, ID band Patient awake    Reviewed: Allergy & Precautions, H&P , NPO status , Patient's Chart, lab work & pertinent test results, reviewed documented beta blocker date and time   Airway Mallampati: II TM Distance: >3 FB Neck ROM: full    Dental  (+) Teeth Intact and Dental Advidsory Given   Pulmonary shortness of breath and at rest, pneumonia -, resolved,  breath sounds clear to auscultation        Cardiovascular + angina at rest + CAD and + Past MI Rhythm:Regular Rate:Normal     Neuro/Psych    GI/Hepatic hiatal hernia,   Endo/Other    Renal/GU      Musculoskeletal   Abdominal   Peds  Hematology   Anesthesia Other Findings   Reproductive/Obstetrics                          Anesthesia Physical Anesthesia Plan  ASA: III  Anesthesia Plan: General   Post-op Pain Management:    Induction: Intravenous  Airway Management Planned: Oral ETT  Additional Equipment: Arterial line, PA Cath and 3D TEE  Intra-op Plan:   Post-operative Plan: Post-operative intubation/ventilation  Informed Consent: I have reviewed the patients History and Physical, chart, labs and discussed the procedure including the risks, benefits and alternatives for the proposed anesthesia with the patient or authorized representative who has indicated his/her understanding and acceptance.   Dental Advisory Given  Plan Discussed with: CRNA and Surgeon  Anesthesia Plan Comments:        Anesthesia Quick Evaluation

## 2012-05-10 NOTE — Anesthesia Procedure Notes (Signed)
Procedure Name: Intubation Date/Time: 05/10/2012 7:38 AM Performed by: Carmela Rima Pre-anesthesia Checklist: Patient identified, Timeout performed, Emergency Drugs available, Suction available and Patient being monitored Patient Re-evaluated:Patient Re-evaluated prior to inductionOxygen Delivery Method: Circle system utilized Preoxygenation: Pre-oxygenation with 100% oxygen Intubation Type: IV induction Ventilation: Mask ventilation without difficulty Laryngoscope Size: Mac and 3 Grade View: Grade II Tube type: Oral Tube size: 8.0 mm Airway Equipment and Method: Stylet Placement Confirmation: ETT inserted through vocal cords under direct vision,  breath sounds checked- equal and bilateral and positive ETCO2 Secured at: 23 cm Tube secured with: Tape Dental Injury: Teeth and Oropharynx as per pre-operative assessment

## 2012-05-10 NOTE — Anesthesia Postprocedure Evaluation (Signed)
  Anesthesia Post-op Note  Patient: Keith Cunningham  Procedure(s) Performed: Procedure(s) (LRB) with comments: CORONARY ARTERY BYPASS GRAFTING (CABG) (N/A) - times three using Left Greater Saphenous Vein Graft harvested endoscopically and Left Internal Mammary Artery INTRAOPERATIVE TRANSESOPHAGEAL ECHOCARDIOGRAM (N/A)  Patient Location: ICU  Anesthesia Type:General  Level of Consciousness: Patient remains intubated per anesthesia plan  Airway and Oxygen Therapy: Patient remains intubated per anesthesia plan and Patient placed on Ventilator (see vital sign flow sheet for setting)  Post-op Pain: none  Post-op Assessment: Post-op Vital signs reviewed, Patient's Cardiovascular Status Stable, Respiratory Function Stable, Patent Airway, No signs of Nausea or vomiting and Pain level controlled  Post-op Vital Signs: stable  Complications: No apparent anesthesia complications

## 2012-05-10 NOTE — Procedures (Signed)
Extubation Procedure Note  Patient Details:   Name: Keith Cunningham DOB: 08-Jun-1941 MRN: [id number]   Airway Documentation:  Pt extubated following wean protocol. Pt on 3L [place] Sats 98%. Pt able to vocalize name, date. No stridor noted. Pt has clear BBS, HR 82, BP 87/51.  Evaluation  O2 sats: stable throughout Complications: No apparent complications Patient did tolerate procedure well. Bilateral Breath Sounds: Clear;Diminished   Yes  Elmer Picker 05/10/2012, 9:38 PM

## 2012-05-10 NOTE — Progress Notes (Signed)
Patient ID: Keith Cunningham, male   DOB: 02-Jul-1940, 71 y.o.   MRN: [id number]                   [address]            [place] [address]          [phone]     Day of Surgery Procedure(s) (LRB): CORONARY ARTERY BYPASS GRAFTING (CABG) (N/A) INTRAOPERATIVE TRANSESOPHAGEAL ECHOCARDIOGRAM (N/A)  Total Length of Stay:  LOS: 4 days  BP 117/61  Pulse 80  Temp 100.2 F (37.9 C) (Core (Comment))  Resp 13  Ht 5\' 6"  (1.676 m)  Wt 156 lb 12 oz (71.1 kg)  BMI 25.30 kg/m2  SpO2 100%  .Intake/Output      11/25 0701 - 11/26 0700   P.O.    I.V. (mL/kg) 3004.2 (42.3)   Blood 400   NG/GT 30   IV Piggyback 1550   Total Intake(mL/kg) 4984.2 (70.1)   Urine (mL/kg/hr) 4150 (4.3)   Emesis/NG output 150   Blood 1400   Chest Tube 400   Total Output 6100   Net -1115.8            . sodium chloride 20 mL/hr (05/10/12 1230)  . sodium chloride 20 mL/hr at 05/10/12 1600  . sodium chloride    . dexmedetomidine Stopped (05/10/12 1600)  . lactated ringers 20 mL/hr (05/10/12 1230)  . nitroGLYCERIN Stopped (05/10/12 1600)  . phenylephrine (NEO-SYNEPHRINE) Adult infusion 20 mcg/min (05/10/12 2000)  . [DISCONTINUED] sodium chloride Stopped (05/10/12 0600)  . [DISCONTINUED] insulin (NOVOLIN-R) infusion Stopped (05/10/12 1230)  . [DISCONTINUED] nitroGLYCERIN 5 mcg/min (05/09/12 2000)     Lab Results  Component Value Date   WBC 9.4 05/10/2012   HGB 8.5* 05/10/2012   HCT 25.0* 05/10/2012   PLT 170 05/10/2012   GLUCOSE 125* 05/10/2012   CHOL 151 05/08/2012   TRIG 95 05/08/2012   HDL 50 05/08/2012   LDLCALC 82 05/08/2012   ALT 40 05/08/2012   AST 35 05/08/2012   NA 141 05/10/2012   K 4.3 05/10/2012   CL 106 05/10/2012   CREATININE 0.90 05/10/2012   BUN 7 05/10/2012   CO2 25 05/08/2012   TSH 3.011 04/22/2012   INR 1.64* 05/10/2012   HGBA1C 6.0* 05/08/2012   Awake and neuro intact Not bleeding Now extubated  Delight Ovens MD  Beeper [phone] Office  [phone] 05/10/2012 8:43 PM

## 2012-05-10 NOTE — OR Nursing (Signed)
2300 called at 1200 for 20 minute call.  Spoke with [employer].

## 2012-05-10 NOTE — Preoperative (Signed)
Beta Blockers   Reason not to administer Beta Blockers:Not Applicable 

## 2012-05-10 NOTE — Transfer of Care (Signed)
Immediate Anesthesia Transfer of Care Note  Patient: Keith Cunningham  Procedure(s) Performed: Procedure(s) (LRB) with comments: CORONARY ARTERY BYPASS GRAFTING (CABG) (N/A) - times three using Left Greater Saphenous Vein Graft harvested endoscopically and Left Internal Mammary Artery INTRAOPERATIVE TRANSESOPHAGEAL ECHOCARDIOGRAM (N/A)  Patient Location: SICU  Anesthesia Type:General  Level of Consciousness: Patient remains intubated per anesthesia plan  Airway & Oxygen Therapy: Patient remains intubated per anesthesia plan and Patient placed on Ventilator (see vital sign flow sheet for setting)  Post-op Assessment: Report given to PACU RN and Post -op Vital signs reviewed and stable  Post vital signs: Reviewed and stable  Complications: No apparent anesthesia complications

## 2012-05-10 NOTE — OR Nursing (Signed)
2300 called at 1141 for 45 minute call.  Spoke with [employer].

## 2012-05-10 NOTE — Progress Notes (Signed)
Just back from CABG where he received 4 vessel grafting with LIMA to LAD. SVG to RI and OM, and SVG to PDA>  The hemodynamics are stable and the post ECG is normal.  Will follow.

## 2012-05-10 NOTE — OR Nursing (Signed)
Volunteer desk called at 1141 to notify family off pump.  Spoke with Bonita Quin.

## 2012-05-10 NOTE — Brief Op Note (Addendum)
05/06/2012 - 05/10/2012  10:49 AM  PATIENT:  Keith Cunningham  71 y.o. male  PRE-OPERATIVE DIAGNOSIS:  CAD  POST-OPERATIVE DIAGNOSIS:  Coronary Artery Disease  PROCEDURE:  CORONARY ARTERY BYPASS GRAFTING x 4 (LIMA-LAD, SVG-Int-dCx, SVG-PD), EVH left leg  SURGEON:  Surgeon(s): Delight Ovens, MD  ASSISTANT: Coral Ceo, PA-C  ANESTHESIA:   general  PATIENT CONDITION:  ICU - intubated and hemodynamically stable.  PRE-OPERATIVE WEIGHT: 71 kg    Delight Ovens MD  Beeper [phone] Office [phone] 05/10/2012 7:40 PM

## 2012-05-10 NOTE — OR Nursing (Signed)
Volunteer desk called at [employer] to notify family of start time.

## 2012-05-11 ENCOUNTER — Inpatient Hospital Stay (HOSPITAL_COMMUNITY): Payer: Medicare Other

## 2012-05-11 ENCOUNTER — Encounter (HOSPITAL_COMMUNITY): Payer: Self-pay | Admitting: Cardiothoracic Surgery

## 2012-05-11 DIAGNOSIS — I970 Postcardiotomy syndrome: Secondary | ICD-10-CM | POA: Diagnosis not present

## 2012-05-11 LAB — CBC
HCT: 26.7 % — ABNORMAL LOW (ref 39.0–52.0)
Hemoglobin: 8.4 g/dL — ABNORMAL LOW (ref 13.0–17.0)
MCH: 24.4 pg — ABNORMAL LOW (ref 26.0–34.0)
MCHC: 32.1 g/dL (ref 30.0–36.0)
MCHC: 31.5 g/dL (ref 30.0–36.0)
MCV: 77.4 fL — ABNORMAL LOW (ref 78.0–100.0)
Platelets: 173 10*3/uL (ref 150–400)
WBC: 15.1 10*3/uL — ABNORMAL HIGH (ref 4.0–10.5)

## 2012-05-11 LAB — POCT I-STAT 3, ART BLOOD GAS (G3+)
pCO2 arterial: 40.8 mmHg (ref 35.0–45.0)
pH, Arterial: 7.372 (ref 7.350–7.450)

## 2012-05-11 LAB — POCT I-STAT, CHEM 8
Chloride: 103 mEq/L (ref 96–112)
Creatinine, Ser: 1 mg/dL (ref 0.50–1.35)
Hemoglobin: 9.2 g/dL — ABNORMAL LOW (ref 13.0–17.0)
Potassium: 3.8 mEq/L (ref 3.5–5.1)
Sodium: 140 mEq/L (ref 135–145)

## 2012-05-11 LAB — GLUCOSE, CAPILLARY
Glucose-Capillary: 137 mg/dL — ABNORMAL HIGH (ref 70–99)
Glucose-Capillary: 138 mg/dL — ABNORMAL HIGH (ref 70–99)
Glucose-Capillary: 129 mg/dL — ABNORMAL HIGH (ref 70–99)

## 2012-05-11 LAB — CREATININE, SERUM
GFR calc Af Amer: >90 mL/min (ref >90)
GFR calc non Af Amer: 85 mL/min — ABNORMAL LOW (ref >90)

## 2012-05-11 LAB — MAGNESIUM
Magnesium: 2.3 mg/dL (ref 1.5–2.5)
Magnesium: 2.4 mg/dL (ref 1.5–2.5)

## 2012-05-11 LAB — BASIC METABOLIC PANEL
Calcium: 8.2 mg/dL — ABNORMAL LOW (ref 8.4–10.5)
GFR calc Af Amer: >90 mL/min (ref >90)
GFR calc non Af Amer: 87 mL/min — ABNORMAL LOW (ref >90)
Potassium: 4 mEq/L (ref 3.5–5.1)
Sodium: 137 mEq/L (ref 135–145)

## 2012-05-11 MED ORDER — METOCLOPRAMIDE HCL 10 MG/10ML PO SOLN
10.0000 mg | Freq: Three times a day (TID) | ORAL | Status: DC
  Administered 2012-05-11 – 2012-05-12 (×2): 10 mg via ORAL
  Filled 2012-05-11 (×3): qty 10

## 2012-05-11 MED ORDER — TRAMADOL HCL 50 MG PO TABS
50.0000 mg | ORAL_TABLET | Freq: Four times a day (QID) | ORAL | Status: DC | PRN
  Administered 2012-05-12 – 2012-05-14 (×5): 50 mg via ORAL
  Filled 2012-05-11 (×4): qty 1

## 2012-05-11 MED ORDER — KETOROLAC TROMETHAMINE 15 MG/ML IJ SOLN
15.0000 mg | Freq: Four times a day (QID) | INTRAMUSCULAR | Status: DC | PRN
  Administered 2012-05-11 (×2): 15 mg via INTRAVENOUS
  Filled 2012-05-11 (×2): qty 1

## 2012-05-11 MED ORDER — PROMETHAZINE HCL 25 MG/ML IJ SOLN: 12.5000 mg | Freq: Four times a day (QID) | INTRAMUSCULAR | Status: DC | PRN

## 2012-05-11 MED ORDER — INSULIN ASPART 100 UNIT/ML ~~LOC~~ SOLN
0.0000 [IU] | Freq: Three times a day (TID) | SUBCUTANEOUS | Status: DC
  Administered 2012-05-11 (×3): 2 [IU] via SUBCUTANEOUS

## 2012-05-11 MED FILL — Mannitol IV Soln 20%: INTRAVENOUS | Qty: 500 | Status: AC

## 2012-05-11 MED FILL — Heparin Sodium (Porcine) Inj 1000 Unit/ML: INTRAMUSCULAR | Qty: 10 | Status: AC

## 2012-05-11 MED FILL — Sodium Chloride Irrigation Soln 0.9%: Qty: 3000 | Status: AC

## 2012-05-11 MED FILL — Sodium Bicarbonate IV Soln 8.4%: INTRAVENOUS | Qty: 50 | Status: AC

## 2012-05-11 MED FILL — Heparin Sodium (Porcine) Inj 1000 Unit/ML: INTRAMUSCULAR | Qty: 30 | Status: AC

## 2012-05-11 MED FILL — Lidocaine HCl IV Inj 20 MG/ML: INTRAVENOUS | Qty: 5 | Status: AC

## 2012-05-11 MED FILL — Electrolyte-R (PH 7.4) Solution: INTRAVENOUS | Qty: 4000 | Status: AC

## 2012-05-11 MED FILL — Potassium Chloride Inj 2 mEq/ML: INTRAVENOUS | Qty: 40 | Status: AC

## 2012-05-11 MED FILL — Magnesium Sulfate Inj 50%: INTRAMUSCULAR | Qty: 10 | Status: AC

## 2012-05-11 MED FILL — Sodium Chloride IV Soln 0.9%: INTRAVENOUS | Qty: 1000 | Status: AC

## 2012-05-11 NOTE — Progress Notes (Signed)
Patient Name: Keith Cunningham Date of Encounter: 05/11/2012    SUBJECTIVE: "I'm having a lot of pain" . He denies shortness of breath. There is a slight pleuritic component to the chest discomfort.  TELEMETRY:  Sinus tachycardia: Filed Vitals:   05/11/12 0600 05/11/12 0700 05/11/12 0800 05/11/12 0900  BP: 95/57 104/60 115/63 123/56  Pulse: 90 95 96 97  Temp: 100.2 F (37.9 C) 100.2 F (37.9 C) 100.6 F (38.1 C) 100 F (37.8 C)  TempSrc:  Core (Comment) Core (Comment) Core (Comment)  Resp: 14 15 19 18   Height:      Weight:      SpO2: 97% 97% 96% 95%    Intake/Output Summary (Last 24 hours) at 05/11/12 0930 Last data filed at 05/11/12 0900  Gross per 24 hour  Intake 4453.36 ml  Output   6590 ml  Net -2136.64 ml    LABS: Basic Metabolic Panel:  Basename 05/11/12 0355 05/10/12 1835 05/10/12 1800  NA 137 141 --  K 4.0 4.3 --  CL 105 106 --  CO2 23 -- --  GLUCOSE 147* 125* --  BUN 10 7 --  CREATININE 0.84 0.90 --  CALCIUM 8.2* -- --  MG 2.3 -- 2.8*  PHOS -- -- --   CBC:  Basename 05/11/12 0355 05/10/12 1835 05/10/12 1800  WBC 11.7* -- 9.4  NEUTROABS -- -- --  HGB 8.0* 8.5* --  HCT 24.9* 25.0* --  MCV 75.9* -- 76.4*  PLT 173 -- 170   Radiology/Studies:  Volume overload on chest x-ray  Physical Exam: Blood pressure 123/56, pulse 97, temperature 100 F (37.8 C), temperature source Core (Comment), resp. rate 18, height 5\' 6"  (1.676 m), weight 73.8 kg (162 lb 11.2 oz), SpO2 95.00%. Weight change: 2.7 kg (5 lb 15.2 oz)   Pericardial friction rub is heard on cardiac auscultation. Lungs are clear anteriorly.  ASSESSMENT:  1. Resolution of acute coronary syndrome status post coronary bypass surgery.  2. Postoperative pericarditis that should resolve once mediastinal tubes are removed  Plan:  1. Analgesia. 2. May need anti-inflammatory therapy of rub continues after mediastinal tubes come out  Selinda Eon 05/11/2012, 9:30 AM

## 2012-05-11 NOTE — Progress Notes (Signed)
Pt. performed NIF 36, VC 750ML's prior to extubation.

## 2012-05-11 NOTE — Progress Notes (Signed)
POD # 1 CABG  BP 102/54  Pulse 91  Temp 98.3 F (36.8 C) (Oral)  Resp 10  Ht 5\' 6"  (1.676 m)  Wt 162 lb 11.2 oz (73.8 kg)  BMI 26.26 kg/m2  SpO2 95%   Intake/Output Summary (Last 24 hours) at 05/11/12 1817 Last data filed at 05/11/12 1500  Gross per 24 hour  Intake 2183.15 ml  Output   1600 ml  Net 583.15 ml    Stable day, continue current care

## 2012-05-11 NOTE — Op Note (Signed)
NAMEMarland Cunningham  [name], [name] NO.:  [id number]  MEDICAL RECORD NO.:  [id number]  LOCATION:  2302                         FACILITY:  [facility]  PHYSICIAN:  Bedelia Person, M.D.        DATE OF BIRTH:  1941/05/13  DATE OF PROCEDURE:  05/10/2012 DATE OF DISCHARGE:                              OPERATIVE REPORT   Mr. Schank is an elderly male with known coronary artery disease.  He has been having some respiratory shortness of breath.  The TEE will be used intraoperatively to assess mitral valvular function as well as overall heart function, pre and postbypass surgery.  The patient has no history of esophageal or gastric pathology.  PROCEDURE:  After induction of general anesthesia, the airway was secured with an oral endotracheal tube, orogastric contents were suctioned with an orogastric tube, which was then removed.  The transesophageal transducer was heavily lubricated, placed blindly down the oropharynx with no significant resistance.  At the completion of the procedure, the probe was removed.  There was no evidence of oral or pharyngeal damage.  Prebypass examination revealed the left ventricle to be concentrically, mildly hypertrophied.  There were no segmental defects.  Contractility was good overall.  Left atrium was normal size.  The appendage was clean.  Interatrial septum was intact on color Doppler.  Mitral valve had two leaflets, opening and closing appropriately collapsing the valvular plane.  There were no masses or vegetations noted.  Color Doppler revealed a trace central regurgitant flow.  The aortic valve had three leaflets, all opening and closing appropriately.  No calcifications were noted.  Color Doppler had no aortic insufficiency. The tricuspid valve also appeared normal.  There was a trace central regurgitant flow with the Swan-Ganz catheter noted across the valve. Right heart exam was otherwise normal.  The patient was placed on cardiopulmonary bypass  and underwent coronary artery bypass grafting at the completion of the bypass.  Left ventricular function was again good. There were no new segmental defects.  Left atrium remain normal in size and appearance.  The mitral valve continued to show just a trace central regurgitant flow pattern as did the tricuspid valve.  Aorta again remained normal in appearance and function.  There were no new findings, postbypass.          ______________________________ Bedelia Person, M.D.     LK/MEDQ  D:  05/10/2012  T:  05/11/2012  Job:  [id number]

## 2012-05-11 NOTE — Progress Notes (Signed)
Patient ID: Keith Cunningham, male   DOB: 08-15-1940, 71 y.o.   MRN: [id number] TCTS DAILY PROGRESS NOTE                   [address]            [place] [address]          [phone]      1 Day Post-Op Procedure(s) (LRB): CORONARY ARTERY BYPASS GRAFTING (CABG) (N/A) INTRAOPERATIVE TRANSESOPHAGEAL ECHOCARDIOGRAM (N/A)  Total Length of Stay:  LOS: 5 days   Subjective: Pain difficult to control,awake and neuro intact  Objective: Vital signs in last 24 hours: Temp:  [96.4 F (35.8 C)-100.6 F (38.1 C)] 100.6 F (38.1 C) (11/26 0800) Pulse Rate:  [70-113] 96  (11/26 0800) Cardiac Rhythm:  [-] Normal sinus rhythm (11/26 0800) Resp:  [10-29] 19  (11/26 0800) BP: (87-120)/(46-74) 115/63 mmHg (11/26 0800) SpO2:  [95 %-100 %] 96 % (11/26 0800) Arterial Line BP: (83-159)/(43-85) 138/52 mmHg (11/26 0800) FiO2 (%):  [39.7 %-80.3 %] 40 % (11/25 2030) Weight:  [162 lb 11.2 oz (73.8 kg)] 162 lb 11.2 oz (73.8 kg) (11/26 0500)  Filed Weights   05/08/12 0400 05/10/12 0515 05/11/12 0500  Weight: 155 lb 10.3 oz (70.6 kg) 156 lb 12 oz (71.1 kg) 162 lb 11.2 oz (73.8 kg)    Weight change: 5 lb 15.2 oz (2.7 kg)   Hemodynamic parameters for last 24 hours: PAP: (16-30)/(7-16) 22/10 mmHg CO:  [3.8 L/min-8.9 L/min] 7.6 L/min CI:  [2.1 L/min/m2-5 L/min/m2] 4.2 L/min/m2  Intake/Output from previous day: 11/25 0701 - 11/26 0700 In: 6223.4 [P.O.:350; I.V.:3839.4; Blood:400; NG/GT:30; IV Piggyback:1604] Out: 7050 [Urine:4740; Emesis/NG output:150; Blood:1400; Chest Tube:760]  Intake/Output this shift: Total I/O In: 40 [I.V.:40] Out: 20 [Urine:20]  Current Meds: Scheduled Meds:   . [COMPLETED] acetaminophen  1,000 mg Intravenous Once  . acetaminophen  1,000 mg Oral Q6H   Or  . acetaminophen (TYLENOL) oral liquid 160 mg/5 mL  975 mg Per Tube Q6H  . aspirin EC  325 mg Oral Daily   Or  . aspirin  324 mg Per Tube Daily  . atorvastatin  40 mg Oral q1800  . bisacodyl  10  mg Oral Daily   Or  . bisacodyl  10 mg Rectal Daily  . metoCLOPramide (REGLAN) injection  10 mg Intravenous Q6H  . metoprolol tartrate  12.5 mg Oral BID   Or  . metoprolol tartrate  12.5 mg Per Tube BID  . mupirocin ointment  1 application Nasal BID  . pantoprazole  40 mg Oral Daily   Continuous Infusions:   . sodium chloride 20 mL/hr (05/10/12 1230)  . sodium chloride 20 mL/hr at 05/10/12 2000  . sodium chloride    . dexmedetomidine Stopped (05/10/12 1600)  . nitroGLYCERIN Stopped (05/10/12 1600)  . phenylephrine (NEO-SYNEPHRINE) Adult infusion 0 mcg/min (05/11/12 0430)  . [DISCONTINUED] sodium chloride Stopped (05/10/12 0600)  . [DISCONTINUED] nitroGLYCERIN 5 mcg/min (05/09/12 2000)   PRN Meds:.0.9 % irrigation (POUR BTL), albumin human, [EXPIRED] lactated ringers, metoprolol, midazolam, [EXPIRED]  morphine injection, morphine injection, ondansetron (ZOFRAN) IV, oxyCODONE, sodium chloride, [DISCONTINUED] acetaminophen, [DISCONTINUED] hemostatic agents, [DISCONTINUED] ondansetron (ZOFRAN) IV, [DISCONTINUED] oxyCODONE-acetaminophen, [DISCONTINUED] Surgifoam 1 Gm with 0.9% sodium chloride (4 ml) topical solution, [DISCONTINUED] zolpidem  General appearance: alert, cooperative and mild distress Neurologic: intact Heart: regular rate and rhythm, S1, S2 normal, no murmur, click, rub or gallop and normal apical impulse Lungs: clear to auscultation bilaterally and normal percussion  bilaterally Abdomen: soft, non-tender; bowel sounds normal; no masses,  no organomegaly Extremities: extremities normal, atraumatic, no cyanosis or edema and Homans sign is negative, no sign of DVT Wound: sternum stable, pericardial rub  Lab Results: CBC: Basename 05/11/12 0355 05/10/12 1835 05/10/12 1800  WBC 11.7* -- 9.4  HGB 8.0* 8.5* --  HCT 24.9* 25.0* --  PLT 173 -- 170   BMET:  Basename 05/11/12 0355 05/10/12 1835  NA 137 141  K 4.0 4.3  CL 105 106  CO2 23 --  GLUCOSE 147* 125*  BUN 10 7    CREATININE 0.84 0.90  CALCIUM 8.2* --    PT/INR:  Basename 05/10/12 1300  LABPROT 18.9*  INR 1.64*   Radiology: Dg Chest 2 View  05/09/2012  *RADIOLOGY REPORT*  Clinical Data: 71 year old male preoperative study for CABG.  Next the 04/21/2012.  CHEST - 2 VIEW  Comparison: 04/21/2012.  Findings: Lung volumes are stable within normal limits.  Cardiac size stable within normal limits. Other mediastinal contours are within normal limits.  Visualized tracheal air column is within normal limits.  Lung parenchyma stable and clear.  No pneumothorax or edema. No acute osseous abnormality identified.  IMPRESSION: No acute cardiopulmonary abnormality.   Original Report Authenticated By: Erskine Speed, M.D.    Dg Chest Portable 1 View In Am  05/11/2012  *RADIOLOGY REPORT*  Clinical Data: Postop heart surgery  PORTABLE CHEST - 1 VIEW  Comparison: 05/10/2012; 05/09/2012; 05/11/2012  Findings:  Grossly unchanged cardiac silhouette and mediastinal contours given slightly decreased lung volumes and patient rotation to the left. Interval extubation and removal of enteric tube.  Minimal advancement of right jugular approach PA catheter with tip overlying the expected location of the right main pulmonary artery outflow tract.  Stable positioning of the left-sided chest tube. No pneumothorax.  Mild pulmonary venous congestion without frank evidence of edema.  Worsening perihilar and bibasilar opacities, left greater than right.  Suspected trace left-sided effusion. Unchanged bones.  Moderate gaseous distension of the stomach.  IMPRESSION: 1.  Interval extubation and removal of enteric tube.  Otherwise, grossly stable positioning of remaining support apparatus.  No pneumothorax. 2.  Decreased lung volumes with worsening perihilar and basilar opacities, likely atelectasis. 3.  Pulmonary venous congestion without frank evidence of edema. Unchanged trace left-sided effusion.   Original Report Authenticated By: Tacey Ruiz, MD     Dg Chest Portable 1 View  05/10/2012  *RADIOLOGY REPORT*  Clinical Data: Status post CABG.  Chest tube.  PORTABLE CHEST - 1 VIEW  Comparison: Yesterday.  Findings: Interval post CABG changes.  Endotracheal tube in satisfactory position.  Right jugular Swan-Ganz catheter tip in the main pulmonary artery or proximal right main pulmonary artery. Nasogastric tube tip in the mid stomach and side hole at the gastroesophageal junction.  Left chest tube.  No pneumothorax. Minimal left basilar atelectasis.  Minimal linear atelectasis in the right lower lung zone.  Lower thoracic spine degenerative changes.  IMPRESSION:   Minimal bilateral atelectasis following CABG.   Original Report Authenticated By: Beckie Salts, M.D.      Assessment/Plan: S/P Procedure(s) (LRB): CORONARY ARTERY BYPASS GRAFTING (CABG) (N/A) INTRAOPERATIVE TRANSESOPHAGEAL ECHOCARDIOGRAM (N/A) Mobilize Diuresis Diabetes control d/c tubes/lines Continue foley due to diuresing patient, strict I&O, patient in ICU and urinary output monitoring See progression orders Expected Acute  Blood - loss Anemia Renal function nl will add several doses of toradol    Devontre Siedschlag B 05/11/2012 8:42 AM

## 2012-05-12 ENCOUNTER — Inpatient Hospital Stay (HOSPITAL_COMMUNITY): Payer: Medicare Other

## 2012-05-12 LAB — CBC
HCT: 24 % — ABNORMAL LOW (ref 39.0–52.0)
Hemoglobin: 7.3 g/dL — ABNORMAL LOW (ref 13.0–17.0)
MCH: 24.1 pg — ABNORMAL LOW (ref 26.0–34.0)
MCHC: 30.4 g/dL (ref 30.0–36.0)
MCV: 79.2 fL (ref 78.0–100.0)
Platelets: 150 10*3/uL (ref 150–400)
RBC: 3.03 MIL/uL — ABNORMAL LOW (ref 4.22–5.81)
RDW: 18.2 % — ABNORMAL HIGH (ref 11.5–15.5)
WBC: 11 10*3/uL — ABNORMAL HIGH (ref 4.0–10.5)

## 2012-05-12 LAB — BASIC METABOLIC PANEL
BUN: 16 mg/dL (ref 6–23)
CO2: 28 mEq/L (ref 19–32)
Calcium: 8.1 mg/dL — ABNORMAL LOW (ref 8.4–10.5)
Chloride: 106 mEq/L (ref 96–112)
Creatinine, Ser: 0.85 mg/dL (ref 0.50–1.35)
GFR calc Af Amer: >90 mL/min (ref >90)
GFR calc non Af Amer: 86 mL/min — ABNORMAL LOW (ref >90)
Glucose, Bld: 108 mg/dL — ABNORMAL HIGH (ref 70–99)
Potassium: 3.7 mEq/L (ref 3.5–5.1)
Sodium: 139 mEq/L (ref 135–145)

## 2012-05-12 LAB — GLUCOSE, CAPILLARY
Glucose-Capillary: 119 mg/dL — ABNORMAL HIGH (ref 70–99)
Glucose-Capillary: 123 mg/dL — ABNORMAL HIGH (ref 70–99)
Glucose-Capillary: 111 mg/dL — ABNORMAL HIGH (ref 70–99)

## 2012-05-12 MED ORDER — POTASSIUM CHLORIDE 10 MEQ/50ML IV SOLN
10.0000 meq | INTRAVENOUS | Status: AC
  Administered 2012-05-12 (×3): 10 meq via INTRAVENOUS
  Filled 2012-05-12 (×3): qty 50

## 2012-05-12 MED ORDER — METOPROLOL TARTRATE 25 MG PO TABS
25.0000 mg | ORAL_TABLET | Freq: Two times a day (BID) | ORAL | Status: DC
  Administered 2012-05-12 – 2012-05-14 (×5): 25 mg via ORAL
  Filled 2012-05-12 (×6): qty 1

## 2012-05-12 NOTE — Progress Notes (Addendum)
[facility] DAILY PROGRESS NOTE                   [address]            [place] [address]          [phone]      2 Days Post-Op Procedure(s) (LRB): CORONARY ARTERY BYPASS GRAFTING (CABG) (N/A) INTRAOPERATIVE TRANSESOPHAGEAL ECHOCARDIOGRAM (N/A)  Total Length of Stay:  LOS: 6 days   Subjective: Some trouble with nausea, tolerating clear liquids fairly well   Objective: Vital signs in last 24 hours: Temp:  [98.1 F (36.7 C)-100.6 F (38.1 C)] 99.2 F (37.3 C) (11/27 0718) Pulse Rate:  [78-128] 90  (11/27 0700) Cardiac Rhythm:  [-] Normal sinus rhythm (11/27 0400) Resp:  [10-26] 15  (11/27 0700) BP: (88-134)/(51-67) 106/62 mmHg (11/27 0700) SpO2:  [91 %-96 %] 91 % (11/27 0700) Arterial Line BP: (132-138)/(52) 132/52 mmHg (11/26 0900) Weight:  [160 lb 15 oz (73 kg)] 160 lb 15 oz (73 kg) (11/27 0500)  Filed Weights   05/10/12 0515 05/11/12 0500 05/12/12 0500  Weight: 156 lb 12 oz (71.1 kg) 162 lb 11.2 oz (73.8 kg) 160 lb 15 oz (73 kg)    Weight change: -1 lb 12.2 oz (-0.8 kg)   Hemodynamic parameters for last 24 hours: PAP: (22)/(8-10) 22/8 mmHg CO:  [7.6 L/min] 7.6 L/min CI:  [4.2 L/min/m2] 4.2 L/min/m2  Intake/Output from previous day: 11/26 0701 - 11/27 0700 In: 1456 [P.O.:770; I.V.:480; IV Piggyback:206] Out: 710 [Urine:610; Chest Tube:100]  Intake/Output this shift:    Current Meds: Scheduled Meds:   . acetaminophen  1,000 mg Oral Q6H   Or  . acetaminophen (TYLENOL) oral liquid 160 mg/5 mL  975 mg Per Tube Q6H  . aspirin EC  325 mg Oral Daily   Or  . aspirin  324 mg Per Tube Daily  . atorvastatin  40 mg Oral q1800  . bisacodyl  10 mg Oral Daily   Or  . bisacodyl  10 mg Rectal Daily  . cefUROXime (ZINACEF)  IV  1.5 g Intravenous Q12H  . Chlorhexidine Gluconate Cloth  6 each Topical Daily  . docusate sodium  200 mg Oral Daily  . insulin aspart  0-24 Units Subcutaneous TID AC & HS  . metoCLOPramide  10 mg Oral TID WC & HS  .  [COMPLETED] metoCLOPramide (REGLAN) injection  10 mg Intravenous Q6H  . metoprolol tartrate  12.5 mg Oral BID   Or  . metoprolol tartrate  12.5 mg Per Tube BID  . mupirocin ointment  1 application Nasal BID  . pantoprazole  40 mg Oral Daily  . potassium chloride  10 mEq Intravenous Q1 Hr x 3  . sodium chloride  3 mL Intravenous Q12H  . [DISCONTINUED] insulin aspart  0-24 Units Subcutaneous Q4H   Continuous Infusions:   . sodium chloride 20 mL/hr (05/10/12 1230)  . sodium chloride 20 mL/hr at 05/10/12 2000  . sodium chloride    . lactated ringers 20 mL/hr (05/11/12 2213)  . [DISCONTINUED] dexmedetomidine Stopped (05/10/12 1600)  . [DISCONTINUED] nitroGLYCERIN Stopped (05/10/12 1600)  . [DISCONTINUED] phenylephrine (NEO-SYNEPHRINE) Adult infusion 0 mcg/min (05/11/12 0430)   PRN Meds:.[EXPIRED] albumin human, ketorolac, metoprolol, ondansetron (ZOFRAN) IV, promethazine, sodium chloride, traMADol, [DISCONTINUED] 0.9 % irrigation (POUR BTL), [DISCONTINUED] midazolam, [DISCONTINUED]  morphine injection, [DISCONTINUED] oxyCODONE  General appearance: alert, cooperative and no distress Heart: regular rate and rhythm Lungs: mildly dim in bases Abdomen: mod distension, + BS,nontender Extremities:  minor edema Wound: dressings CDI  Lab Results: CBC: Basename 05/12/12 0335 05/11/12 1650  WBC 11.0* 15.1*  HGB 7.3* 8.4*9.2*  HCT 24.0* 26.7*27.0*  PLT 150 184   BMET:  Basename 05/12/12 0335 05/11/12 1650 05/11/12 0355  NA 139 140 --  K 3.7 3.8 --  CL 106 103 --  CO2 28 -- 23  GLUCOSE 108* 150* --  BUN 16 15 --  CREATININE 0.85 0.881.00 --  CALCIUM 8.1* -- 8.2*    PT/INR:  Basename 05/10/12 1300  LABPROT 18.9*  INR 1.64*   Radiology: Dg Chest Portable 1 View In Am  05/11/2012  *RADIOLOGY REPORT*  Clinical Data: Postop heart surgery  PORTABLE CHEST - 1 VIEW  Comparison: 05/10/2012; 05/09/2012; 05/11/2012  Findings:  Grossly unchanged cardiac silhouette and mediastinal contours  given slightly decreased lung volumes and patient rotation to the left. Interval extubation and removal of enteric tube.  Minimal advancement of right jugular approach PA catheter with tip overlying the expected location of the right main pulmonary artery outflow tract.  Stable positioning of the left-sided chest tube. No pneumothorax.  Mild pulmonary venous congestion without frank evidence of edema.  Worsening perihilar and bibasilar opacities, left greater than right.  Suspected trace left-sided effusion. Unchanged bones.  Moderate gaseous distension of the stomach.  IMPRESSION: 1.  Interval extubation and removal of enteric tube.  Otherwise, grossly stable positioning of remaining support apparatus.  No pneumothorax. 2.  Decreased lung volumes with worsening perihilar and basilar opacities, likely atelectasis. 3.  Pulmonary venous congestion without frank evidence of edema. Unchanged trace left-sided effusion.   Original Report Authenticated By: Tacey Ruiz, MD    Dg Chest Portable 1 View  05/10/2012  *RADIOLOGY REPORT*  Clinical Data: Status post CABG.  Chest tube.  PORTABLE CHEST - 1 VIEW  Comparison: Yesterday.  Findings: Interval post CABG changes.  Endotracheal tube in satisfactory position.  Right jugular Swan-Ganz catheter tip in the main pulmonary artery or proximal right main pulmonary artery. Nasogastric tube tip in the mid stomach and side hole at the gastroesophageal junction.  Left chest tube.  No pneumothorax. Minimal left basilar atelectasis.  Minimal linear atelectasis in the right lower lung zone.  Lower thoracic spine degenerative changes.  IMPRESSION:   Minimal bilateral atelectasis following CABG.   Original Report Authenticated By: Beckie Salts, M.D.      Assessment/Plan: S/P Procedure(s) (LRB): CORONARY ARTERY BYPASS GRAFTING (CABG) (N/A) INTRAOPERATIVE TRANSESOPHAGEAL ECHOCARDIOGRAM (N/A)  1. Doing well 2 May have mild degree of ileus so will need to go slow with diet 3 H/H  stable- monitor 4 fair UO- diurese furthur, renal fxn normal 5 pulm toilet/rehab 6 Sinus tachy- increase beta blocker 7 appears to be stable for tx to 2000     Keith Cunningham,Keith Cunningham 05/12/2012 7:41 AM   Nausea better To 2000 today I have seen and examined Keith Cunningham and agree with the above assessment  and plan.  Delight Ovens MD Beeper [phone] Office [phone] 05/12/2012 8:46 AM

## 2012-05-12 NOTE — Op Note (Signed)
NAMENEKHI, LIWANAG NO.:  [id number]  MEDICAL RECORD NO.:  [id number]  LOCATION:  2302                         FACILITY:  [facility]  PHYSICIAN:  Sheliah Plane, MD    DATE OF BIRTH:  Oct 30, 1940  DATE OF PROCEDURE:  05/10/2012 DATE OF DISCHARGE:                              OPERATIVE REPORT   PREOPERATIVE DIAGNOSES:  Coronary occlusive disease with unstable angina, recent non-ST elevation myocardial infarction.  POSTOPERATIVE DIAGNOSES:  Coronary occlusive disease with unstable angina, recent non-ST elevation myocardial infarction.  SURGICAL PROCEDURE:  Coronary artery bypass grafting x4 with the left internal mammary to the left anterior descending coronary artery, sequential reverse saphenous vein graft to the intermediate coronary artery and distal circumflex coronary artery, reverse saphenous vein graft to the posterior descending coronary artery with left leg endo vein harvesting.  SURGEON:  Sheliah Plane, MD  FIRST ASSISTANT:  Coral Ceo, PA  BRIEF HISTORY:  The patient is a 71 year old male who in early November was seen at [facility] for unstable anginal symptoms.  At that time had positive troponins.  Ultimately, he was discharged home and then was readmitted by Dr. Katrinka Blazing because of increasing unstable anginal symptoms. He underwent cardiac catheterization, which revealed complex ostial LAD and circumflex disease with 90% LAD, a large intermediate with 95% stenosis, 70% stenosis of the circumflex.  In addition, the distal right had a 60-70% stenosis.  Because of the patient's critical LAD and intermediate disease that was not amenable to angioplasty.  Coronary artery bypass grafting was recommended to the patient who agreed and signed informed consent.  DESCRIPTION OF PROCEDURE:  With Swan-Ganz and arterial line monitors in place, the patient underwent general endotracheal anesthesia without incident.  Skin of the chest and legs were prepped  with Betadine and draped in usual sterile manner.  TEE probe was placed by anesthesia and details are dictated under separate note.  After appropriate time-out was performed and the patient had been prepped and draped in usual sterile manner, initial incision was made in the right leg and begun removing this with the [employer] endo vein harvesting system, however, it became apparent that vein was very small and probably not usable, so the dissection was abandoned in the right and incision was made in the left knee area and then adequate segment of vein was harvested from the thigh and upper calf and was of good quality and caliber.  Median sternotomy was performed.  The left internal mammary artery was dissected down as pedicle graft.  The distal artery was divided, had good free flow. Pericardium was opened.  Overall ventricular function appeared preserved.  The patient was systemically heparinized.  Ascending aorta was cannulated.  The right atrium was cannulated and aortic root vent cardioplegia needle was introduced into the ascending aorta.  The patient was placed on cardiopulmonary bypass at 2.4 L/minute per meter squared.  Sites of anastomosis were dissected out of the epicardium. The patient body temperature was cooled to 32 degrees.  Aortic crossclamp was applied, 500 mL of cold blood potassium cardioplegia was administered with diastolic arrest of the heart.  Myocardial septal temperature was monitored throughout the crossclamp.  Attention was turned first to the posterior  descending coronary artery, which was opened and admitted 1.5 mm probe distally.  Using a running 7-0 Prolene, distal anastomosis was performed.  The heart was then elevated and the intermediate coronary artery which was a moderate-sized vessel was opened and admitted 1.5 mm probe easily.  Using a diamond-type, side-to- side anastomosis was carried out.  Dissection of the same vein was then carried to the  smaller circumflex branch, which was 1.2-1.3 mm in size. Using a running 8-0 Prolene, distal anastomosis was performed. Additional cold blood cardioplegia was administered down the vein graft. Attention was then turned to the left anterior descending coronary artery, which was mostly intramyocardial.  The distal third of the vessel was dissected out of its intramyocardial position and was identified.  I admitted a 1.5-mm probe proximally and a 1-mm probe distally.  Using a running 8-0 Prolene, left internal mammary artery was anastomosed to the left anterior descending coronary artery.  With release of the bulldog on the mammary artery, there was appropriate rise in myocardial septal temperature.  Bulldog was placed back on the mammary artery.  With crossclamp still in place, 2 punch aortotomies were performed and each of the 2 vein grafts were anastomosed to the ascending aorta.  Air was evacuated from the grafts and aortic cross- clamp was removed with total crossclamp time of 73 minutes.  The patient spontaneously converted to a sinus rhythm.  He was paced atrially to increase his rate.  Sites of anastomosis were inspected and were free of bleeding.  He was then ventilated and weaned from cardiopulmonary bypass without difficulty.  He remained hemodynamically stable.  He was decannulated in usual fashion.  Protamine sulfate was administered with the operative field hemostatic.  A left pleural tube and a Blake mediastinal drain were left in place.  Pericardium was loosely reapproximated.  Graft markers were applied.  The sternum was then closed with #6 stainless steel wire.  Fascia closed with interrupted 0 Vicryl, running 3-0 Vicryl in subcutaneous tissue, 4-0 subcuticular stitch in skin edges.  Dry dressings were applied.  Sponge and needle count was reported as correct at completion of procedure.  The patient tolerated the procedure without obvious complication.  He was transferred  to the Surgical Intensive Care Unit for further postoperative care.  Total pump time was 101 minutes.     Sheliah Plane, MD     EG/MEDQ  D:  05/11/2012  T:  05/11/2012  Job:  [id number]  cc:   Lyn Records, M.D.

## 2012-05-12 NOTE — Progress Notes (Signed)
Patient transferred to 2038 via wheelchair.  Tolerated transfer well.  Parked and walked to room 2038.  Placed on monitor on 2000.  Vitals stable.  Wife at bedside

## 2012-05-12 NOTE — Progress Notes (Signed)
Awake without significant chest pain. Breathing without restriction. He's had no arrhythmias overnight. The pericardial rub has resolved after removal of mediastinal chest tubes. Monitor reveals sinus tachycardia. Extremities reveal no edema.  Overall patient is doing well.

## 2012-05-13 LAB — GLUCOSE, CAPILLARY: Glucose-Capillary: 98 mg/dL (ref 70–99)

## 2012-05-13 NOTE — Progress Notes (Signed)
Reviewed Dr. Michaelle Copas note Spoke to Mr. Molla - no rub on exam today Sinus tachy periods yesterday with ambulation Feel better  Courtesy visit.

## 2012-05-13 NOTE — Progress Notes (Addendum)
                   [address]            [place] [address]          [phone]      3 Days Post-Op Procedure(s) (LRB): CORONARY ARTERY BYPASS GRAFTING (CABG) (N/A) INTRAOPERATIVE TRANSESOPHAGEAL ECHOCARDIOGRAM (N/A)  Subjective: Patient passing flatus but no bowel movement yet. He does not want a laxative yet  Objective: Vital signs in last 24 hours: Temp:  [98.2 F (36.8 C)-98.9 F (37.2 C)] 98.5 F (36.9 C) (11/28 0446) Pulse Rate:  [88-104] 98  (11/28 0446) Cardiac Rhythm:  [-] Normal sinus rhythm (11/27 1945) Resp:  [15-22] 18  (11/28 0446) BP: (103-140)/(63-83) 112/72 mmHg (11/28 0446) SpO2:  [90 %-100 %] 93 % (11/28 0446) Weight:  [157 lb 13.6 oz (71.6 kg)] 157 lb 13.6 oz (71.6 kg) (11/28 0446)  Pre op weight 71 kg Current Weight  05/13/12 157 lb 13.6 oz (71.6 kg)      Intake/Output from previous day: 11/27 0701 - 11/28 0700 In: 790 [P.O.:700; I.V.:40; IV Piggyback:50] Out: 475 [Urine:475]   Physical Exam:  Cardiovascular: Tachycardic Pulmonary: Clear to auscultation bilaterally; no rales, wheezes, or rhonchi. Abdomen: Soft, non tender, bowel sounds present. Extremities: Mild bilateral lower extremity edema. Wounds: Clean and dry.  No erythema or signs of infection.  Lab Results: CBC: Basename 05/12/12 0335 05/11/12 1650  WBC 11.0* 15.1*  HGB 7.3* 8.4*9.2*  HCT 24.0* 26.7*27.0*  PLT 150 184   BMET:  Basename 05/12/12 0335 05/11/12 1650 05/11/12 0355  NA 139 140 --  K 3.7 3.8 --  CL 106 103 --  CO2 28 -- 23  GLUCOSE 108* 150* --  BUN 16 15 --  CREATININE 0.85 0.881.00 --  CALCIUM 8.1* -- 8.2*    PT/INR:  Lab Results  Component Value Date   INR 1.64* 05/10/2012   INR 1.16 05/08/2012   INR 1.20 03/12/2010   ABG:  INR: Will add last result for INR, ABG once components are confirmed Will add last 4 CBG results once components are confirmed  Assessment/Plan:  1. CV - SR/ST. On Lopressor 25 bid 2.  Pulmonary -  Encourage incentive spirometer 3. Volume Overload - Continue with diursis 4.  Acute blood loss anemia - Last H and H 7.3 and 24. 5.CBGs 119/123/111/98.Pre op HGA1C 6. Will need further surveillance as an outpatient. Will stop accu checks and SS 6.Remove EPW in am 7. Likely home Saturday  ZIMMERMAN,DONIELLE MPA-C 05/13/2012,9:05 AM   I have seen and examined the patient and agree with the assessment and plan as outlined.  Franz Svec H 05/13/2012 10:31 AM

## 2012-05-14 MED ORDER — METOPROLOL TARTRATE 25 MG PO TABS: 25.0000 mg | ORAL_TABLET | Freq: Two times a day (BID) | ORAL | Status: DC

## 2012-05-14 MED ORDER — POTASSIUM CHLORIDE CRYS ER 20 MEQ PO TBCR
20.0000 meq | EXTENDED_RELEASE_TABLET | Freq: Once | ORAL | Status: AC
  Administered 2012-05-14: 20 meq via ORAL
  Filled 2012-05-14: qty 1

## 2012-05-14 MED ORDER — FUROSEMIDE 40 MG PO TABS
40.0000 mg | ORAL_TABLET | Freq: Once | ORAL | Status: AC
  Administered 2012-05-14: 40 mg via ORAL
  Filled 2012-05-14: qty 1

## 2012-05-14 MED ORDER — ATORVASTATIN CALCIUM 40 MG PO TABS: 40.0000 mg | ORAL_TABLET | Freq: Every day | ORAL | Status: DC

## 2012-05-14 MED ORDER — ASPIRIN 325 MG PO TBEC: 325.0000 mg | DELAYED_RELEASE_TABLET | Freq: Every day | ORAL | Status: DC

## 2012-05-14 MED ORDER — TRAMADOL HCL 50 MG PO TABS: 50.0000 mg | ORAL_TABLET | Freq: Four times a day (QID) | ORAL | Status: DC | PRN

## 2012-05-14 NOTE — Progress Notes (Addendum)
                   [address]            [place] [address]          [phone]      4 Days Post-Op Procedure(s) (LRB): CORONARY ARTERY BYPASS GRAFTING (CABG) (N/A) INTRAOPERATIVE TRANSESOPHAGEAL ECHOCARDIOGRAM (N/A)  Subjective: Patient has had a bowel movement. He wants to go home  Objective: Vital signs in last 24 hours: Temp:  [97.9 F (36.6 C)-99.1 F (37.3 C)] 97.9 F (36.6 C) (11/29 0509) Pulse Rate:  [89-102] 89  (11/29 0509) Cardiac Rhythm:  [-] Normal sinus rhythm (11/28 1930) Resp:  [16-18] 16  (11/29 0509) BP: (117-128)/(76-79) 128/76 mmHg (11/29 0509) SpO2:  [92 %-98 %] 98 % (11/29 0509) Weight:  [155 lb 3.2 oz (70.398 kg)] 155 lb 3.2 oz (70.398 kg) (11/29 0509)  Pre op weight 71 kg Current Weight  05/14/12 155 lb 3.2 oz (70.398 kg)      Intake/Output from previous day: 11/28 0701 - 11/29 0700 In: 720 [P.O.:720] Out: 800 [Urine:800]   Physical Exam:  Cardiovascular: RRR Pulmonary: Clear to auscultation bilaterally; no rales, wheezes, or rhonchi. Abdomen: Soft, non tender, bowel sounds present. Extremities: Mild bilateral lower extremity edema. Wounds: Clean and dry.  No erythema or signs of infection.  Lab Results: CBC:  Basename 05/12/12 0335 05/11/12 1650  WBC 11.0* 15.1*  HGB 7.3* 8.4*9.2*  HCT 24.0* 26.7*27.0*  PLT 150 184   BMET:   Basename 05/12/12 0335 05/11/12 1650  NA 139 140  K 3.7 3.8  CL 106 103  CO2 28 --  GLUCOSE 108* 150*  BUN 16 15  CREATININE 0.85 0.881.00  CALCIUM 8.1* --    PT/INR:  Lab Results  Component Value Date   INR 1.64* 05/10/2012   INR 1.16 05/08/2012   INR 1.20 03/12/2010   ABG:  INR: Will add last result for INR, ABG once components are confirmed Will add last 4 CBG results once components are confirmed  Assessment/Plan:  1. CV - ST/SR. On Lopressor 25 bid 2.  Pulmonary - Encourage incentive spirometer 3. Volume Overload - Continue with diursis 4.  Acute blood loss  anemia - Last H and H 7.3 and 24. 5.Remove EPW in am 6. Likely home later today  ZIMMERMAN,DONIELLE MPA-C 05/14/2012,8:13 AM    I have seen and examined the patient and agree with the assessment and plan as outlined.  D/C home today.  OWEN,CLARENCE H 05/14/2012 9:44 AM

## 2012-05-14 NOTE — Progress Notes (Signed)
Courtesy note  Tele - NSR..sinus tachy.  Doing well.

## 2012-05-14 NOTE — Progress Notes (Addendum)
CARDIAC REHAB PHASE I . 0800-0900  PRE:  Rate/Rhythm: 100 st   BP:  Supine:   Sitting: 122/66 Standing:    SaO2: 96 % RA  MODE:  Ambulation: 550 ft   POST:  Rate/Rhythem: 116  BP:  Supine:   Sitting: 122/80  Standing:    SaO2: 99 % RA   Patient ambulated independently with staff with beside and wife on the other side. Patient tolerated well \. No complaints. Denies shortness of breath, denies any chest pain. Did great. Discharge education completed with patient and his wife. Handouts given. Agrees to cardiac rehab. Will send the referral to [facility]. Patient back to bedside with alarm in reach.   Zionna Homewood, Toni Amend

## 2012-05-14 NOTE — Progress Notes (Signed)
Discharged to home with family office visits in place teaching done  

## 2012-05-14 NOTE — Progress Notes (Signed)
dc'ed pacing wires per unit protical 

## 2012-05-14 NOTE — Discharge Summary (Signed)
Physician Discharge Summary  Patient ID: Keith Cunningham MRN: [id number] DOB/AGE: 1941/03/21 71 y.o.  Admit date: 05/06/2012 Discharge date: 05/14/2012  Admission Diagnoses:  1.Multivessel CAD 2.History of anemia 3.History of hiatal hernia 4.History of melanoma of left temple 5.History of hemorrhoids  Discharge Diagnoses:   1.Multivessel CAD 2.History of anemia 3.History of hiatal hernia 4.History of melanoma of left temple 5.History of hemorrhoids    Procedure (s):  1.Cardiac catheterization done by Dr. Katrinka Blazing on 05/06/2012: ANGIOGRAPHIC DATA: The left main coronary artery is widely patent.  The left anterior descending artery is focal eccentric 95% ostial LAD stenosis.-Use proximal 30-50% narrowing.  The ramus intermedius branch contains a segmental 95% stenosis. The severity of the stenosis is noticed most significantly on the LAO caudal (spider view).  The left circumflex artery is 50-60% narrowed after the origin of the left atrial recurrent branch..  The right coronary artery is 50-70% distal RCA before the origin of the PDA. The left ventricular branch proximal segment contains 50% narrowing.Marland Kitchen  LEFT VENTRICULOGRAM: Left ventricular angiogram was done in the 30 RAO projection and revealed normal left ventricular wall motion and systolic function with an estimated ejection fraction of 70 %. LVEDP was normal.   2.Coronary artery bypass grafting x4 with the left  internal mammary to the left anterior descending coronary artery,  sequential reverse saphenous vein graft to the intermediate coronary  artery and distal circumflex coronary artery, reverse saphenous vein  graft to the posterior descending coronary artery with left leg endo  vein harvesting by Dr. Tyrone Sage on 05/10/2012.   History of Presenting Illness: This is a 71 year-old Caucasian male with history of melanoma developed some chest pressure the evening 11/6/2013at around 8 PM while at home. He initially  felt dizzy and short of breath. He then went to the bathroom to have a bowel movement, after which he started developing chest pressure felt nauseated and had diaphoresis. The chest pressure persisted and he decided to come to the ER. He was brought to the ER by his wife in a car. He may have vomited once. In the ER, EKG and cardiac enzymes did not show any acute myocardial infarction. Chest x-ray was unremarkable. Patient's chest pain improved with sublingual nitroglycerin. He was admitted for further management. It should be noted he denied any fever chills or any productive cough. A cardiology consult was obtained. It was recommended he undergo a cardiac catheterization, but he refused. It was decided to discharge the patient and have him undergo a stress test as an outpatient. Following the stress test, he then had nearly continuous chest tightness and dyspnea on exertion. He then underwent a cardiac catheterization by Dr. Katrinka Blazing on 05/06/2012. He was found to have multivessel coronary artery disease with a preserved LVEF of 70%. A cardiothoracic consultation was obtained with Dr. Tyrone Sage for the consideration of coronary artery bypass grafting surgery. Pre operative carotid US showed no evidence of hemodynamically significant internal carotid artery stenosis.Potential risks, benefits, and complications were discussed and he agreed to proceed. He underwent a CABG x 4 on 05/10/2012.  Brief Hospital Course:  He was extubated without difficulty the evening of surgery. He remained afebrile and hemodynamically stable. His Theone Murdoch, a line, chest tubes, and foley were all removed early in his post operative course. He was volume overloaded and diuresed accordingly.  He had sinus tachycardia and his Lopressor was increased.He was felt surgically stable for transfer from the ICU to [facility]U for further convalescence on 05/12/2012. He was tolerating  a diet and has had a bowel movement. Epicardial pacing wires and chest  tube sutures are going to be removed today. He has been ambulating well on room air. He is felt surgically stable for discharge today.    Latest Vital Signs: Blood pressure 128/76, pulse 89, temperature 97.9 F (36.6 C), temperature source Oral, resp. rate 16, height 5\' 6"  (1.676 m), weight 155 lb 3.2 oz (70.398 kg), SpO2 98.00%.  Physical Exam:   Discharge Condition:Stable  Recent laboratory studies:  Lab Results  Component Value Date   WBC 11.0* 05/12/2012   HGB 7.3* 05/12/2012   HCT 24.0* 05/12/2012   MCV 79.2 05/12/2012   PLT 150 05/12/2012   Lab Results  Component Value Date   NA 139 05/12/2012   K 3.7 05/12/2012   CL 106 05/12/2012   CO2 28 05/12/2012   CREATININE 0.85 05/12/2012   GLUCOSE 108* 05/12/2012      Diagnostic Studies:    Dg Chest Portable 1 View In Am  05/12/2012  *RADIOLOGY REPORT*  Clinical Data: Postop coronary bypass.  PORTABLE CHEST - 1 VIEW  Comparison: 05/11/2012  Findings: Swan-Ganz catheter, mediastinal drain, left chest tube all removed.  Right IJ vascular sheath remains in the upper SVC. Low lung volumes persist with basilar atelectasis and trace pleural effusions.  No pneumothorax.  Coronary bypass changes evident.  IMPRESSION: Low volume exam with atelectasis and trace pleural effusions.  No pneumothorax.   Original Report Authenticated By: Judie Petit. Miles Costain, M.D.     Discharge Medications:   Medication List     As of 05/14/2012  8:35 AM    TAKE these medications         aspirin 325 MG EC tablet   Take 1 tablet (325 mg total) by mouth daily.      atorvastatin 40 MG tablet   Commonly known as: LIPITOR   Take 1 tablet (40 mg total) by mouth daily at 6 PM.      co-enzyme Q-10 30 MG capsule   Take 30 mg by mouth daily.      metoprolol tartrate 25 MG tablet   Commonly known as: LOPRESSOR   Take 1 tablet (25 mg total) by mouth 2 (two) times daily.      multivitamin with minerals Tabs   Take 1 tablet by mouth daily.      traMADol 50 MG  tablet   Commonly known as: ULTRAM   Take 1 tablet (50 mg total) by mouth every 6 (six) hours as needed for pain.      The patient has been discharged on:   1.Beta Blocker:  Yes [  x ]                              No   [   ]                              If No, reason:  2.Ace Inhibitor/ARB: Yes [   ]                                     No  [   x ]  If No, reason:BP labile  3.Statin:   Yes [  x ]                  No  [   ]                  If No, reason:  4.Ecasa:  Yes  [  x ]                  No   [   ]                  If No, reason:   Follow Up Appointments:     Follow-up Information    Follow up with Lesleigh Noe, MD. (Call for a follow up appointment for 2 weeks)    Contact information:   [address] [place] [address] [phone]       Follow up with GERHARDT,EDWARD B, MD. (PA/LAT CXR to be taken (at [facility] which is in the same building as Dr. Dennie Maizes office) 45 minutes prior to office appointment with Dr. Aaron Mose will call with an appintment date and time)    Contact information:   [address] [place] [address] [phone]       Follow up with Medical doctor. (Call for a follow up appointment regarding pre op HGA1C 6)          Signed: ZIMMERMAN,DONIELLE MPA-C 05/14/2012, 8:35 AM

## 2012-05-27 ENCOUNTER — Other Ambulatory Visit: Payer: Self-pay | Admitting: Physician Assistant

## 2012-06-02 ENCOUNTER — Other Ambulatory Visit: Payer: Self-pay | Admitting: *Deleted

## 2012-06-02 DIAGNOSIS — I2510 Atherosclerotic heart disease of native coronary artery without angina pectoris: Secondary | ICD-10-CM

## 2012-06-02 DIAGNOSIS — Z951 Presence of aortocoronary bypass graft: Secondary | ICD-10-CM

## 2012-06-07 ENCOUNTER — Ambulatory Visit (INDEPENDENT_AMBULATORY_CARE_PROVIDER_SITE_OTHER): Payer: Self-pay | Admitting: Physician Assistant

## 2012-06-07 ENCOUNTER — Ambulatory Visit
Admission: RE | Admit: 2012-06-07 | Discharge: 2012-06-07 | Disposition: A | Discharge status: Home / Self Care | Payer: Medicare Other | Source: Ambulatory Visit | Attending: Cardiothoracic Surgery | Admitting: Cardiothoracic Surgery

## 2012-06-07 VITALS — BP 135/86 | HR 100 | Resp 20 | Ht 66.0 in | Wt 155.0 lb

## 2012-06-07 DIAGNOSIS — I2510 Atherosclerotic heart disease of native coronary artery without angina pectoris: Secondary | ICD-10-CM

## 2012-06-07 DIAGNOSIS — Z951 Presence of aortocoronary bypass graft: Secondary | ICD-10-CM

## 2012-06-07 MED ORDER — TRAMADOL HCL 50 MG PO TABS: 50.0000 mg | ORAL_TABLET | Freq: Four times a day (QID) | ORAL | Status: DC | PRN

## 2012-06-07 NOTE — Progress Notes (Signed)
  HPI: Patient returns for routine postoperative follow-up having undergone CABG x4 on 05/10/2012. The patient's early postoperative recovery while in the hospital was unremarkable. Since hospital discharge the patient reports he is still experiencing some discomfort across his chest especially along his left shoulder. The patient denies any heavy lifting or excessive use of upper extremities.  He does states he has been using a ball to strengthen his grip in his left hand because he had no grip strength after surgery.  He also states that he has a large "lump" know along his RLE incision, but states that did not take vein there.  He states the lump has gotten smaller since surgery.  The patient states he is ambulating without difficulty.    Current Outpatient Prescriptions  Medication Sig Dispense Refill  . aspirin EC 325 MG EC tablet Take 1 tablet (325 mg total) by mouth daily.  30 tablet    . atorvastatin (LIPITOR) 40 MG tablet Take 1 tablet (40 mg total) by mouth daily at 6 PM.  30 tablet  1  . co-enzyme Q-10 30 MG capsule Take 30 mg by mouth daily.      . metoprolol tartrate (LOPRESSOR) 25 MG tablet Take 1 tablet (25 mg total) by mouth 2 (two) times daily.  60 tablet  1  . Multiple Vitamin (MULTIVITAMIN WITH MINERALS) TABS Take 1 tablet by mouth daily.      . traMADol (ULTRAM) 50 MG tablet Take 1 tablet (50 mg total) by mouth every 6 (six) hours as needed for pain.  30 tablet  0    Physical Exam:  BP 135/86  Pulse 100  Resp 20  Ht 5\' 6"  (1.676 m)  Wt 155 lb (70.308 kg)  BMI 25.02 kg/m2  SpO2 99%  Gen: no apparent distress Lungs: CTA bilaterally Heart: RRR, sternum stable Abd: soft non-tender, non-distended Skin: Sternotomy C/D/I, RLE incision large hardened area no signs of infection, LLE incision C/D/I Neuro: grossly intact  Diagnostic Tests:  CXR: small posterior left sided pleural effusion, no pneumothorax, sternal wires intact  Impression:  Keith Cunningham is S/P CABG  doing well.  He does have some continued pain that he states has improved since hospital discharge.  His RLE does have a large hardened area along his incision site.  I feel this is most likely hematoma, but could also be a seroma.    Plan:  The patient will return to clinic in 1 month for follow up.  In regards to his RLE large hardened area along incision I told the patient I feel this is most likely a hematoma, but it could be a seroma.  The patient was instructed to contact our office should the area get larger, becomes erythematous, or drainage develops.  The patient will be given a refill on his Toradol 50mg  Q6 prn pain.  He was instructed to use Tylenol for pain that is not as severe.  He was instructed that he could drive short distances.  Finally he was instructed to continue to obey sternal precautions.

## 2012-07-08 ENCOUNTER — Encounter: Payer: Self-pay | Admitting: Cardiothoracic Surgery

## 2012-07-08 VITALS — Ht 66.0 in

## 2012-07-11 NOTE — Progress Notes (Deleted)
[address]            [place] [address]          [phone]       DIMA FERRUFINO [facility] Medical Record #[id number] Date of Birth: 09-21-1940  Lesleigh Noe, MD Benita Stabile, MD  Chief Complaint:   PostOp Follow Up Visit   History of Present Illness:               History  Smoking status  . Never Smoker   Smokeless tobacco  . Never Used       No Known Allergies  Current Outpatient Prescriptions  Medication Sig Dispense Refill  . aspirin EC 325 MG EC tablet Take 1 tablet (325 mg total) by mouth daily.  30 tablet    . atorvastatin (LIPITOR) 40 MG tablet Take 1 tablet (40 mg total) by mouth daily at 6 PM.  30 tablet  1  . co-enzyme Q-10 30 MG capsule Take 30 mg by mouth daily.      . metoprolol tartrate (LOPRESSOR) 25 MG tablet Take 1 tablet (25 mg total) by mouth 2 (two) times daily.  60 tablet  1  . Multiple Vitamin (MULTIVITAMIN WITH MINERALS) TABS Take 1 tablet by mouth daily.      . traMADol (ULTRAM) 50 MG tablet Take 1 tablet (50 mg total) by mouth every 6 (six) hours as needed for pain.  30 tablet  0       Physical Exam: Ht 5\' 6"  (1.676 m)  {Physical Exam:3041110} Wounds:  Diagnostic Studies & Laboratory data:         Recent Radiology Findings: No results found.    Recent Labs: Lab Results  Component Value Date   WBC 11.0* 05/12/2012   HGB 7.3* 05/12/2012   HCT 24.0* 05/12/2012   PLT 150 05/12/2012   GLUCOSE 108* 05/12/2012   CHOL 151 05/08/2012   TRIG 95 05/08/2012   HDL 50 05/08/2012   LDLCALC 82 05/08/2012   ALT 40 05/08/2012   AST 35 05/08/2012   NA 139 05/12/2012   K 3.7 05/12/2012   CL 106 05/12/2012   CREATININE 0.85 05/12/2012   BUN 16 05/12/2012   CO2 28 05/12/2012   TSH 3.011 04/22/2012   INR 1.64* 05/10/2012   HGBA1C 6.0* 05/08/2012      Assessment / Plan:            Shavonda Wiedman B 07/11/2012 9:42 PM                             [address]            [place] [address]          [phone]       ELZA SORTOR [facility] Medical Record #[id number] Date of Birth: 07-16-1940  Lesleigh Noe, MD Benita Stabile, MD  Chief Complaint:   PostOp Follow Up Visit   History of Present Illness:               History  Smoking status  . Never Smoker   Smokeless tobacco  . Never Used       No Known Allergies  Current Outpatient Prescriptions  Medication Sig Dispense Refill  . aspirin EC 325 MG EC tablet Take 1 tablet (  325 mg total) by mouth daily.  30 tablet    . atorvastatin (LIPITOR) 40 MG tablet Take 1 tablet (40 mg total) by mouth daily at 6 PM.  30 tablet  1  . co-enzyme Q-10 30 MG capsule Take 30 mg by mouth daily.      . metoprolol tartrate (LOPRESSOR) 25 MG tablet Take 1 tablet (25 mg total) by mouth 2 (two) times daily.  60 tablet  1  . Multiple Vitamin (MULTIVITAMIN WITH MINERALS) TABS Take 1 tablet by mouth daily.      . traMADol (ULTRAM) 50 MG tablet Take 1 tablet (50 mg total) by mouth every 6 (six) hours as needed for pain.  30 tablet  0       Physical Exam: Ht 5\' 6"  (1.676 m)  {Physical Exam:3041110} Wounds:  Diagnostic Studies & Laboratory data:         Recent Radiology Findings: No results found.    Recent Labs: Lab Results  Component Value Date   WBC 11.0* 05/12/2012   HGB 7.3* 05/12/2012   HCT 24.0* 05/12/2012   PLT 150 05/12/2012   GLUCOSE 108* 05/12/2012   CHOL 151 05/08/2012   TRIG 95 05/08/2012   HDL 50 05/08/2012   LDLCALC 82 05/08/2012   ALT 40 05/08/2012   AST 35 05/08/2012   NA 139 05/12/2012   K 3.7 05/12/2012   CL 106 05/12/2012   CREATININE 0.85 05/12/2012   BUN 16 05/12/2012   CO2 28 05/12/2012   TSH 3.011 04/22/2012   INR 1.64* 05/10/2012   HGBA1C 6.0* 05/08/2012      Assessment / Plan:            Eladia Frame B 07/11/2012 9:42 PM

## 2012-07-17 NOTE — Progress Notes (Signed)
This encounter was created in error - please disregard.

## 2012-08-05 ENCOUNTER — Ambulatory Visit (INDEPENDENT_AMBULATORY_CARE_PROVIDER_SITE_OTHER): Payer: Self-pay | Admitting: Cardiothoracic Surgery

## 2012-08-05 ENCOUNTER — Encounter: Payer: Self-pay | Admitting: Cardiothoracic Surgery

## 2012-08-05 VITALS — BP 127/73 | HR 96 | Resp 16 | Ht 66.0 in | Wt 145.0 lb

## 2012-08-05 DIAGNOSIS — Z951 Presence of aortocoronary bypass graft: Secondary | ICD-10-CM

## 2012-08-05 DIAGNOSIS — I2510 Atherosclerotic heart disease of native coronary artery without angina pectoris: Secondary | ICD-10-CM

## 2012-08-05 NOTE — Progress Notes (Signed)
[address]            [place] [address]          [phone]       Keith Cunningham [facility] Medical Record #[id number] Date of Birth: 05-19-1941  Lesleigh Noe, MD Benita Stabile, MD  Chief Complaint:   PostOp Follow Up Visit 05/10/2012  DATE OF DISCHARGE:  OPERATIVE REPORT  PREOPERATIVE DIAGNOSES: Coronary occlusive disease with unstable  angina, recent non-ST elevation myocardial infarction.  POSTOPERATIVE DIAGNOSES: Coronary occlusive disease with unstable  angina, recent non-ST elevation myocardial infarction.  SURGICAL PROCEDURE: Coronary artery bypass grafting x4 with the left  internal mammary to the left anterior descending coronary artery,  sequential reverse saphenous vein graft to the intermediate coronary  artery and distal circumflex coronary artery, reverse saphenous vein  graft to the posterior descending coronary artery with left leg endo  vein harvesting.   History of Present Illness:     Patient doing well following artery bypass surgery almost 3 months ago. Early postop he had some difficulty with numbness in his left hand secondary to brachial stretch the symptoms have almost completely resolved. He's had no recurrent angina or evidence of congestive heart failure. He has been working with Dr. Michaelle Copas office about his cholesterol medicine. He has been intolerant of statins. He is currently not on any cholesterol medication     History  Smoking status  . Never Smoker   Smokeless tobacco  . Never Used       Allergies  Allergen Reactions  . Statins Other (See Comments)    Myalgias     Current Outpatient Prescriptions  Medication Sig Dispense Refill  . aspirin 81 MG tablet Take 81 mg by mouth daily.      Marland Kitchen co-enzyme Q-10 50 MG capsule Take 100 mg by mouth 2 (two) times daily.      . fish oil-omega-3 fatty acids 1000 MG capsule Take 1 g by mouth daily.      . Multiple Vitamin (MULTIVITAMIN  WITH MINERALS) TABS Take 1 tablet by mouth daily.       No current facility-administered medications for this visit.       Physical Exam: BP 127/73  Pulse 96  Resp 16  Ht 5\' 6"  (1.676 m)  Wt 145 lb (65.772 kg)  BMI 23.41 kg/m2  SpO2 98%  General appearance: alert, cooperative and no distress Neurologic: intact and left hand grip good and sensation Heart: regular rate and rhythm, S1, S2 normal, no murmur, click, rub or gallop and normal apical impulse Lungs: clear to auscultation bilaterally and normal percussion bilaterally Abdomen: soft, non-tender; bowel sounds normal; no masses,  no organomegaly Extremities: extremities normal, atraumatic, no cyanosis or edema and Homans sign is negative, no sign of DVT Wound: sternum well healed   Diagnostic Studies & Laboratory data:         Recent Radiology Findings: No results found.    Recent Labs: Lab Results  Component Value Date   WBC 11.0* 05/12/2012   HGB 7.3* 05/12/2012   HCT 24.0* 05/12/2012   PLT 150 05/12/2012   GLUCOSE 108* 05/12/2012   CHOL 151 05/08/2012   TRIG 95 05/08/2012   HDL 50 05/08/2012   LDLCALC 82 05/08/2012   ALT 40 05/08/2012   AST 35 05/08/2012   NA 139 05/12/2012   K 3.7 05/12/2012  CL 106 05/12/2012   CREATININE 0.85 05/12/2012   BUN 16 05/12/2012   CO2 28 05/12/2012   TSH 3.011 04/22/2012   INR 1.64* 05/10/2012   HGBA1C 6.0* 05/08/2012      Assessment / Plan:      Doing well post op Brachial stretch symptoms almost completely resolved Not able to tolerate statins, so quit taking. We'll plan to see him back when necessary at his or Dr. Michaelle Copas request.    Sheliah Plane B 08/05/2012 9:52 AM

## 2012-08-24 ENCOUNTER — Other Ambulatory Visit: Payer: Self-pay | Admitting: Family Medicine

## 2012-08-24 ENCOUNTER — Ambulatory Visit
Admission: RE | Admit: 2012-08-24 | Discharge: 2012-08-24 | Disposition: A | Discharge status: Home / Self Care | Payer: Medicare Other | Source: Ambulatory Visit | Attending: Family Medicine | Admitting: Family Medicine

## 2012-08-24 DIAGNOSIS — H9190 Unspecified hearing loss, unspecified ear: Secondary | ICD-10-CM

## 2012-08-24 DIAGNOSIS — R42 Dizziness and giddiness: Secondary | ICD-10-CM

## 2012-08-24 DIAGNOSIS — Z139 Encounter for screening, unspecified: Secondary | ICD-10-CM

## 2012-08-26 ENCOUNTER — Ambulatory Visit
Admission: RE | Admit: 2012-08-26 | Discharge: 2012-08-26 | Disposition: A | Discharge status: Home / Self Care | Payer: Medicare Other | Source: Ambulatory Visit | Attending: Family Medicine | Admitting: Family Medicine

## 2012-08-26 DIAGNOSIS — H9190 Unspecified hearing loss, unspecified ear: Secondary | ICD-10-CM

## 2012-08-26 DIAGNOSIS — R42 Dizziness and giddiness: Secondary | ICD-10-CM

## 2012-08-26 MED ORDER — GADOBENATE DIMEGLUMINE 529 MG/ML IV SOLN
14.0000 mL | Freq: Once | INTRAVENOUS | Status: AC | PRN
  Administered 2012-08-26: 14 mL via INTRAVENOUS

## 2012-09-07 ENCOUNTER — Emergency Department (HOSPITAL_COMMUNITY): Payer: Medicare Other

## 2012-09-07 ENCOUNTER — Emergency Department (HOSPITAL_COMMUNITY)
Admission: EM | Admit: 2012-09-07 | Discharge: 2012-09-07 | Disposition: A | Discharge status: Home Health | Payer: Medicare Other | Attending: Emergency Medicine | Admitting: Emergency Medicine

## 2012-09-07 ENCOUNTER — Encounter (HOSPITAL_COMMUNITY): Payer: Self-pay | Admitting: *Deleted

## 2012-09-07 DIAGNOSIS — Z85828 Personal history of other malignant neoplasm of skin: Secondary | ICD-10-CM | POA: Insufficient documentation

## 2012-09-07 DIAGNOSIS — Z951 Presence of aortocoronary bypass graft: Secondary | ICD-10-CM | POA: Insufficient documentation

## 2012-09-07 DIAGNOSIS — I252 Old myocardial infarction: Secondary | ICD-10-CM | POA: Insufficient documentation

## 2012-09-07 DIAGNOSIS — R0602 Shortness of breath: Secondary | ICD-10-CM | POA: Insufficient documentation

## 2012-09-07 DIAGNOSIS — Z7982 Long term (current) use of aspirin: Secondary | ICD-10-CM | POA: Insufficient documentation

## 2012-09-07 DIAGNOSIS — Z79899 Other long term (current) drug therapy: Secondary | ICD-10-CM | POA: Insufficient documentation

## 2012-09-07 DIAGNOSIS — Z862 Personal history of diseases of the blood and blood-forming organs and certain disorders involving the immune mechanism: Secondary | ICD-10-CM | POA: Insufficient documentation

## 2012-09-07 DIAGNOSIS — Z8719 Personal history of other diseases of the digestive system: Secondary | ICD-10-CM | POA: Insufficient documentation

## 2012-09-07 DIAGNOSIS — I2510 Atherosclerotic heart disease of native coronary artery without angina pectoris: Secondary | ICD-10-CM | POA: Insufficient documentation

## 2012-09-07 DIAGNOSIS — Z8679 Personal history of other diseases of the circulatory system: Secondary | ICD-10-CM | POA: Insufficient documentation

## 2012-09-07 DIAGNOSIS — R42 Dizziness and giddiness: Secondary | ICD-10-CM

## 2012-09-07 DIAGNOSIS — Z8701 Personal history of pneumonia (recurrent): Secondary | ICD-10-CM | POA: Insufficient documentation

## 2012-09-07 LAB — COMPREHENSIVE METABOLIC PANEL
Albumin: 3.8 g/dL (ref 3.5–5.2)
Alkaline Phosphatase: 95 U/L (ref 39–117)
BUN: 11 mg/dL (ref 6–23)
Calcium: 9.4 mg/dL (ref 8.4–10.5)
Creatinine, Ser: 0.85 mg/dL (ref 0.50–1.35)
Potassium: 4.6 mEq/L (ref 3.5–5.1)
Total Protein: 6.9 g/dL (ref 6.0–8.3)

## 2012-09-07 LAB — CBC
HCT: 33.6 % — ABNORMAL LOW (ref 39.0–52.0)
MCH: 22.1 pg — ABNORMAL LOW (ref 26.0–34.0)
MCHC: 30.4 g/dL (ref 30.0–36.0)
RDW: 17.5 % — ABNORMAL HIGH (ref 11.5–15.5)

## 2012-09-07 MED ORDER — DIAZEPAM 5 MG PO TABS: 5.0000 mg | ORAL_TABLET | Freq: Three times a day (TID) | ORAL | Status: DC | PRN

## 2012-09-07 MED ORDER — MECLIZINE HCL 50 MG PO TABS: 25.0000 mg | ORAL_TABLET | Freq: Three times a day (TID) | ORAL | Status: DC | PRN

## 2012-09-07 MED ORDER — MECLIZINE HCL 25 MG PO TABS
25.0000 mg | ORAL_TABLET | Freq: Once | ORAL | Status: AC
  Administered 2012-09-07: 25 mg via ORAL
  Filled 2012-09-07: qty 1

## 2012-09-07 MED ORDER — HYDROCHLOROTHIAZIDE 25 MG PO TABS: 12.5000 mg | ORAL_TABLET | Freq: Every day | ORAL | Status: DC

## 2012-09-07 NOTE — ED Notes (Signed)
Pt with hx of intermittant "dizzy spells" x 1 month that are increasing in intensity and sob.  He has rcvd MRI that was clear.  Hx of quadruple bypass surgery in Nov - s/s preceding this were dizziness and sob. Pt called his cardiologist, Dr Katrinka Blazing, and was told to come to ED.

## 2012-09-07 NOTE — ED Provider Notes (Signed)
History     CSN: [id number]  Arrival date & time 09/07/12  1706   First MD Initiated Contact with Patient 09/07/12 1726      Chief Complaint  Patient presents with  . Dizziness  . Shortness of Breath    (Consider location/radiation/quality/duration/timing/severity/associated sxs/prior treatment) HPI Pt with hx of intermittant "dizzy spells" x 1 month that are increasing in intensity and sob.  Patient describes these spells as intermittent in true vertigo with sensation of him being still in the world turning around associate with nausea.  They can be incapacitating when the occur.  Patient denies any chest pain with them. He has rcvd MRI that was clear. Hx of quadruple bypass surgery in Nov - s/s preceding this were dizziness and sob. Pt called his cardiologist, Dr Katrinka Blazing, and was told to come to ED. patient also has been having ringing in his ears.  Past Medical History  Diagnosis Date  . Pneumonia     "as a child" (05/06/2012)  . Myocardial infarction 04/21/2012  . Anginal pain 04/21/2012  . Coronary artery disease   . Shortness of breath     "due to this heart attack" (05/06/2012)  . Anemia   . History of blood transfusion 2012    "related to bleeding hemorrhoids" (05/06/2012)  . Bleeding internal hemorrhoids 2012  . H/O hiatal hernia   . Melanoma of face 2009    left temple    Past Surgical History  Procedure Laterality Date  . Tonsillectomy and adenoidectomy  ~ 1950  . Excisional hemorrhoidectomy  2012    "tried to shrink 1st w/needle; cut them out 2 wk later" (05/06/2012)  . Melanoma excision  2009    "left side of head" (05/06/2012)  . Cardiac catheterization  04/21/2012  . Coronary artery bypass graft  05/10/2012    Procedure: CORONARY ARTERY BYPASS GRAFTING (CABG);  Surgeon: Delight Ovens, MD;  Location: [facility] OR;  Service: Open Heart Surgery;  Laterality: N/A;  times three using Left Greater Saphenous Vein Graft harvested endoscopically and Left Internal Mammary  Artery  . Intraoperative transesophageal echocardiogram  05/10/2012    Procedure: INTRAOPERATIVE TRANSESOPHAGEAL ECHOCARDIOGRAM;  Surgeon: Delight Ovens, MD;  Location: [facility] OR;  Service: Open Heart Surgery;  Laterality: N/A;    Family History  Problem Relation Age of Onset  . Diabetes Mellitus II Mother     History  Substance Use Topics  . Smoking status: Never Smoker   . Smokeless tobacco: Never Used  . Alcohol Use: Yes     Comment: 05/06/2012 "used to drink back years ago; nothing in last 25 yrs"      Review of Systems  Respiratory: Negative for cough and chest tightness.   All other systems reviewed and are negative.    Allergies  Codeine and Statins  Home Medications   Current Outpatient Rx  Name  Route  Sig  Dispense  Refill  . acetaminophen (TYLENOL) 500 MG tablet   Oral   Take 1,000 mg by mouth every 6 (six) hours as needed (for headache).         Marland Kitchen aspirin 81 MG tablet   Oral   Take 81 mg by mouth daily.         Marland Kitchen co-enzyme Q-10 50 MG capsule   Oral   Take 100 mg by mouth 2 (two) times daily.         . fish oil-omega-3 fatty acids 1000 MG capsule   Oral   Take 1 g  by mouth daily.         . Multiple Vitamin (MULTIVITAMIN WITH MINERALS) TABS   Oral   Take 1 tablet by mouth daily.         . diazepam (VALIUM) 5 MG tablet   Oral   Take 1 tablet (5 mg total) by mouth every 8 (eight) hours as needed for anxiety.   30 tablet   0   . hydrochlorothiazide (HYDRODIURIL) 25 MG tablet   Oral   Take 0.5 tablets (12.5 mg total) by mouth daily.   30 tablet   0   . meclizine (ANTIVERT) 50 MG tablet   Oral   Take 0.5 tablets (25 mg total) by mouth 3 (three) times daily as needed for dizziness.   30 tablet   0     BP 153/70  Pulse 87  Temp(Src) 97.8 F (36.6 C) (Oral)  Resp 20  Ht 5\' 6"  (1.676 m)  Wt 145 lb (65.772 kg)  BMI 23.41 kg/m2  SpO2 100%  Physical Exam  Nursing note and vitals reviewed. Constitutional: He is oriented to  person, place, and time. He appears well-developed and well-nourished. No distress.  HENT:  Head: Normocephalic and atraumatic.  Eyes: Pupils are equal, round, and reactive to light.  Neck: Normal range of motion.  Cardiovascular: Normal rate and intact distal pulses.   Pulmonary/Chest: No respiratory distress.  Abdominal: Normal appearance. He exhibits no distension.  Musculoskeletal: Normal range of motion.  Neurological: He is alert and oriented to person, place, and time. No cranial nerve deficit.  Skin: Skin is warm and dry. No rash noted.  Psychiatric: He has a normal mood and affect. His behavior is normal.    ED Course  Procedures (including critical care time)  Date: 09/07/2012  Rate: 85  Rhythm: normal sinus rhythm  QRS Axis: normal  Intervals: normal  ST/T Wave abnormalities: normal  Conduction Disutrbances: none  Narrative Interpretation: Borderline EKG     Labs Reviewed  CBC - Abnormal; Notable for the following:    Hemoglobin 10.2 (*)    HCT 33.6 (*)    MCV 72.7 (*)    MCH 22.1 (*)    RDW 17.5 (*)    All other components within normal limits  COMPREHENSIVE METABOLIC PANEL - Abnormal; Notable for the following:    Glucose, Bld 102 (*)    GFR calc non Af Amer 86 (*)    All other components within normal limits   Dg Chest 2 View  09/07/2012  *RADIOLOGY REPORT*  Clinical Data: Shortness of breath.  CHEST - 2 VIEW  Comparison: 06/07/2012.  Findings: The cardiac silhouette, mediastinal and hilar contours are normal and stable.  Stable surgical changes from bypass surgery.  The lungs are clear.  No pleural effusion.  The bony thorax is intact.  IMPRESSION: No acute cardiopulmonary findings.   Original Report Authenticated By: Rudie Meyer, M.D.      1. Vertigo       MDM      I reviewed his MRI scan which was normal.  Symptoms typical of Mnire's.  We'll try outpatient trial of meclizine and HCTZ.  Encouraged followup with specialist in vertigo if this  does not work.    Nelia Shi, MD 09/07/12 2116

## 2013-08-13 IMAGING — CR DG CHEST 2V
2 series · 2 of 2 positions shown · non-contrast
Comparison: 04/21/2012.

CLINICAL DATA: 71-year-old male preoperative study for CABG.  Next
the 04/21/2012.

CHEST - 2 VIEW

[w chest pa]
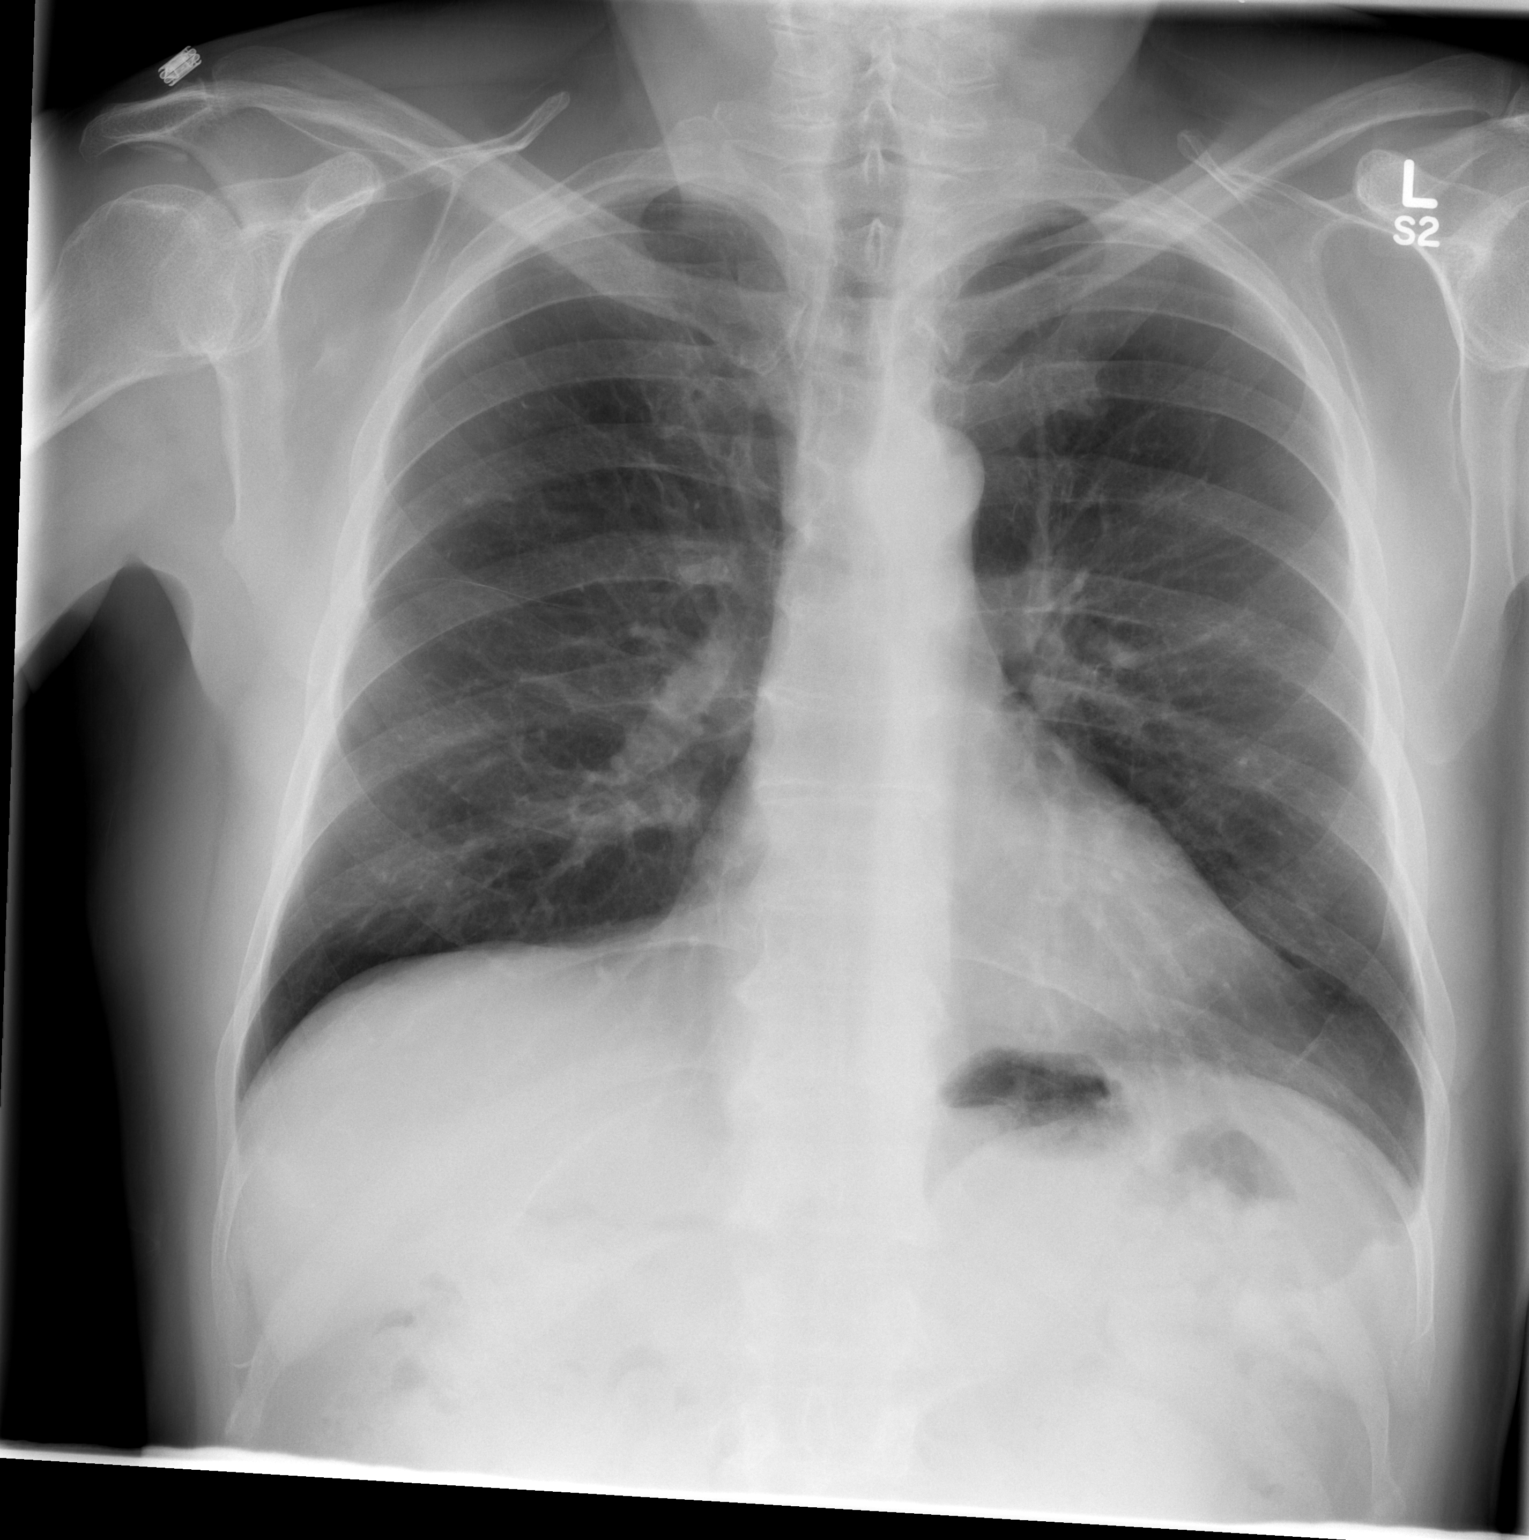

[w chest lat]
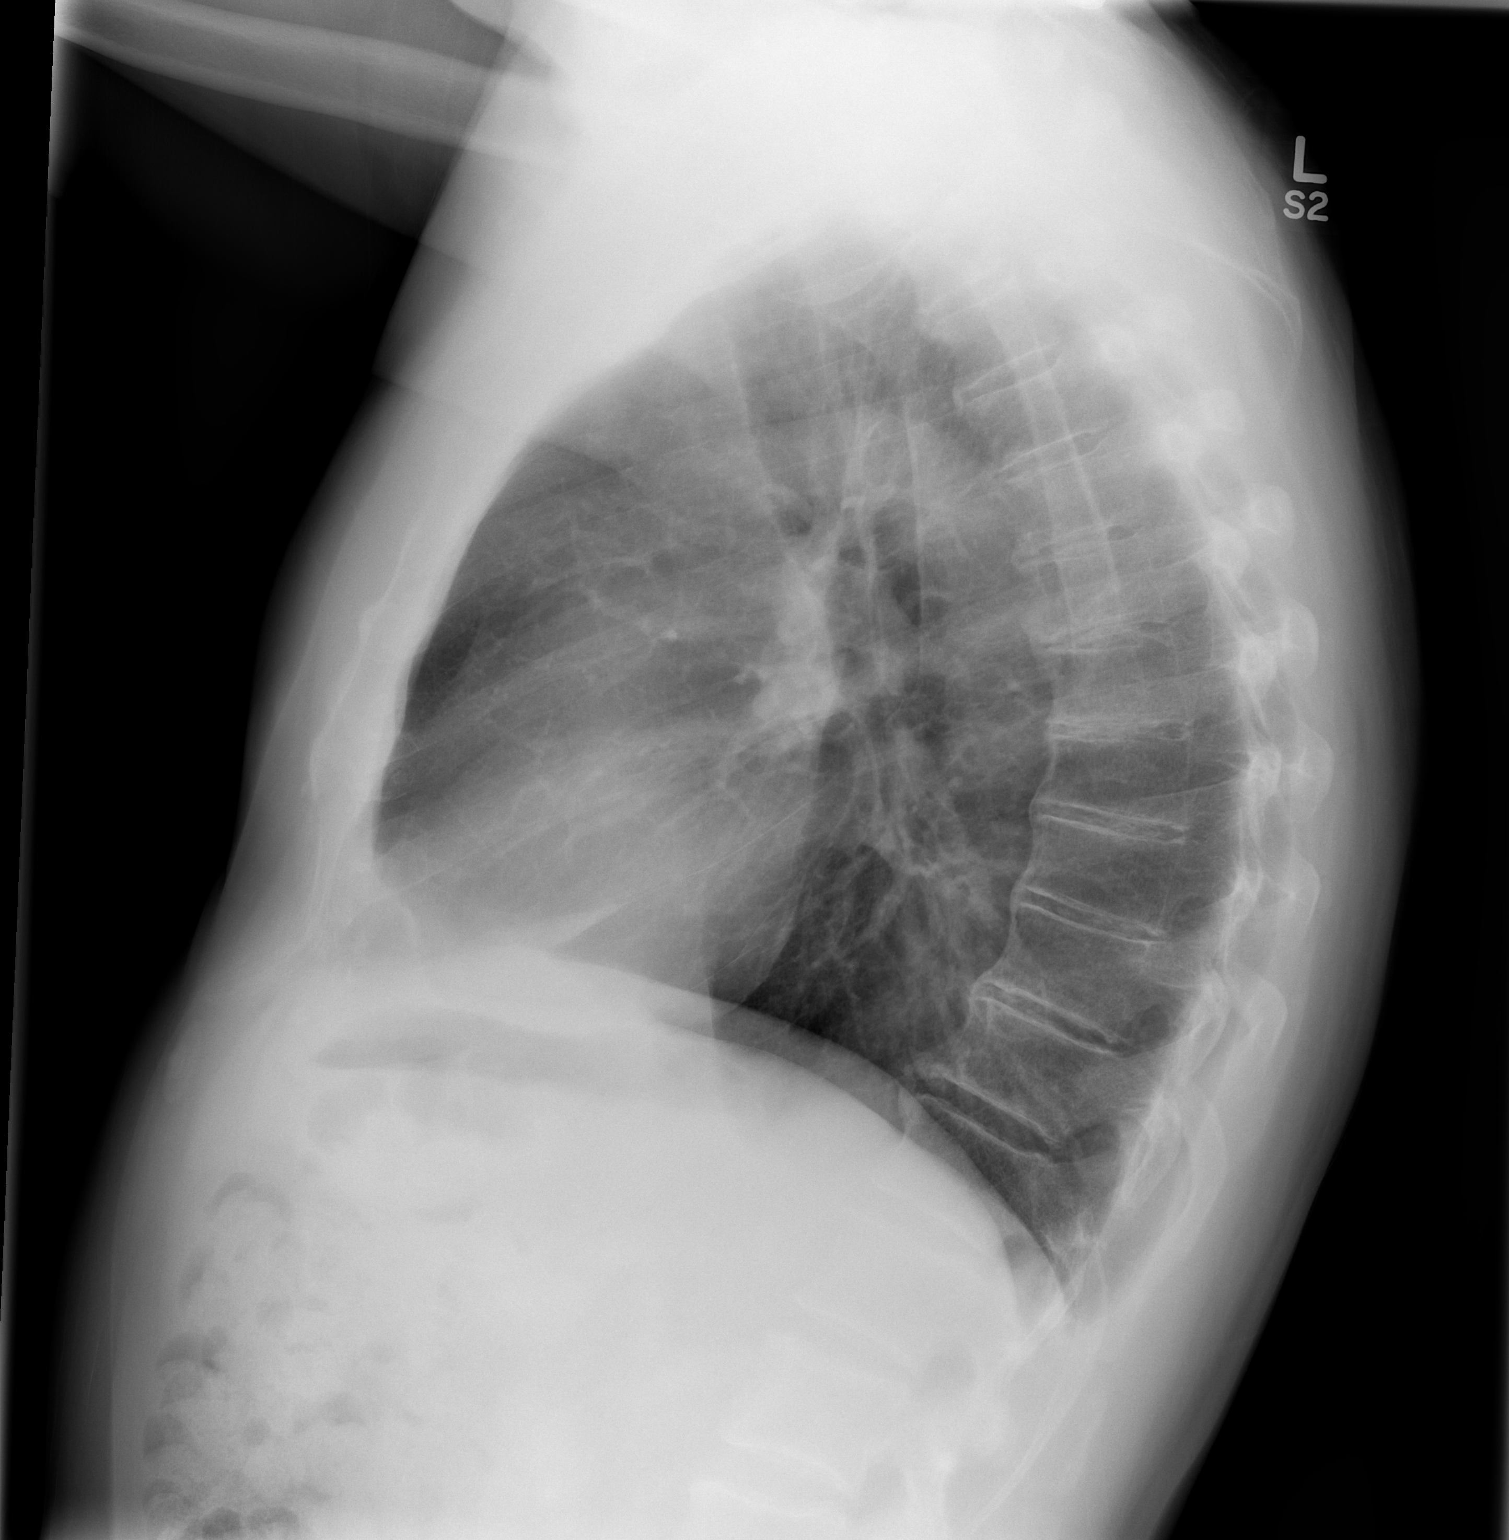

[2 of 2 positions shown; findings below may reference images not displayed]

FINDINGS: Lung volumes are stable within normal limits.  Cardiac
size stable within normal limits. Other mediastinal contours are
within normal limits.  Visualized tracheal air column is within
normal limits.  Lung parenchyma stable and clear.  No pneumothorax
or edema. No acute osseous abnormality identified.
IMPRESSION: No acute cardiopulmonary abnormality.

## 2014-02-27 ENCOUNTER — Emergency Department (HOSPITAL_COMMUNITY): Payer: Medicare Other

## 2014-02-27 ENCOUNTER — Encounter (HOSPITAL_COMMUNITY): Payer: Self-pay | Admitting: Emergency Medicine

## 2014-02-27 ENCOUNTER — Emergency Department (HOSPITAL_COMMUNITY)
Admission: EM | Admit: 2014-02-27 | Discharge: 2014-02-27 | Disposition: A | Discharge status: Home / Self Care | Payer: Medicare Other | Attending: Emergency Medicine | Admitting: Emergency Medicine

## 2014-02-27 DIAGNOSIS — Z7982 Long term (current) use of aspirin: Secondary | ICD-10-CM | POA: Diagnosis not present

## 2014-02-27 DIAGNOSIS — R112 Nausea with vomiting, unspecified: Secondary | ICD-10-CM | POA: Insufficient documentation

## 2014-02-27 DIAGNOSIS — Z8719 Personal history of other diseases of the digestive system: Secondary | ICD-10-CM | POA: Insufficient documentation

## 2014-02-27 DIAGNOSIS — I252 Old myocardial infarction: Secondary | ICD-10-CM | POA: Insufficient documentation

## 2014-02-27 DIAGNOSIS — Z862 Personal history of diseases of the blood and blood-forming organs and certain disorders involving the immune mechanism: Secondary | ICD-10-CM | POA: Diagnosis not present

## 2014-02-27 DIAGNOSIS — Z8701 Personal history of pneumonia (recurrent): Secondary | ICD-10-CM | POA: Diagnosis not present

## 2014-02-27 DIAGNOSIS — R42 Dizziness and giddiness: Secondary | ICD-10-CM | POA: Diagnosis not present

## 2014-02-27 DIAGNOSIS — Z8639 Personal history of other endocrine, nutritional and metabolic disease: Secondary | ICD-10-CM | POA: Insufficient documentation

## 2014-02-27 DIAGNOSIS — I2510 Atherosclerotic heart disease of native coronary artery without angina pectoris: Secondary | ICD-10-CM | POA: Insufficient documentation

## 2014-02-27 DIAGNOSIS — Z951 Presence of aortocoronary bypass graft: Secondary | ICD-10-CM | POA: Diagnosis not present

## 2014-02-27 DIAGNOSIS — Z9889 Other specified postprocedural states: Secondary | ICD-10-CM | POA: Insufficient documentation

## 2014-02-27 DIAGNOSIS — Z85820 Personal history of malignant melanoma of skin: Secondary | ICD-10-CM | POA: Diagnosis not present

## 2014-02-27 DIAGNOSIS — Z79899 Other long term (current) drug therapy: Secondary | ICD-10-CM | POA: Diagnosis not present

## 2014-02-27 DIAGNOSIS — R079 Chest pain, unspecified: Secondary | ICD-10-CM | POA: Diagnosis present

## 2014-02-27 LAB — BASIC METABOLIC PANEL
ANION GAP: 16 — AB (ref 5–15)
BUN: 14 mg/dL (ref 6–23)
CO2: 23 mEq/L (ref 19–32)
Calcium: 9.2 mg/dL (ref 8.4–10.5)
Chloride: 100 mEq/L (ref 96–112)
Creatinine, Ser: 0.84 mg/dL (ref 0.50–1.35)
GFR, EST NON AFRICAN AMERICAN: 86 mL/min — AB (ref >90)
Glucose, Bld: 146 mg/dL — ABNORMAL HIGH (ref 70–99)
POTASSIUM: 4.2 meq/L (ref 3.7–5.3)
SODIUM: 139 meq/L (ref 137–147)

## 2014-02-27 LAB — CBC WITH DIFFERENTIAL/PLATELET
BASOS ABS: 0 10*3/uL (ref 0.0–0.1)
BASOS PCT: 0 % (ref 0–1)
EOS ABS: 0 10*3/uL (ref 0.0–0.7)
Eosinophils Relative: 0 % (ref 0–5)
HCT: 37.1 % — ABNORMAL LOW (ref 39.0–52.0)
Hemoglobin: 11.9 g/dL — ABNORMAL LOW (ref 13.0–17.0)
Lymphocytes Relative: 15 % (ref 12–46)
Lymphs Abs: 1.1 10*3/uL (ref 0.7–4.0)
MCH: 25.2 pg — AB (ref 26.0–34.0)
MCHC: 32.1 g/dL (ref 30.0–36.0)
MCV: 78.4 fL (ref 78.0–100.0)
Monocytes Absolute: 0.3 10*3/uL (ref 0.1–1.0)
Monocytes Relative: 4 % (ref 3–12)
NEUTROS PCT: 81 % — AB (ref 43–77)
Neutro Abs: 5.9 10*3/uL (ref 1.7–7.7)
PLATELETS: 236 10*3/uL (ref 150–400)
RBC: 4.73 MIL/uL (ref 4.22–5.81)
RDW: 15.7 % — AB (ref 11.5–15.5)
WBC: 7.3 10*3/uL (ref 4.0–10.5)

## 2014-02-27 LAB — TROPONIN I

## 2014-02-27 MED ORDER — ASPIRIN 81 MG PO CHEW
324.0000 mg | CHEWABLE_TABLET | Freq: Once | ORAL | Status: AC
  Administered 2014-02-27: 324 mg via ORAL
  Filled 2014-02-27: qty 4

## 2014-02-27 MED ORDER — DIAZEPAM 5 MG PO TABS: 5.0000 mg | ORAL_TABLET | Freq: Two times a day (BID) | ORAL | Status: DC | PRN

## 2014-02-27 MED ORDER — MECLIZINE HCL 25 MG PO TABS
25.0000 mg | ORAL_TABLET | Freq: Once | ORAL | Status: AC
  Administered 2014-02-27: 25 mg via ORAL
  Filled 2014-02-27: qty 1

## 2014-02-27 MED ORDER — MECLIZINE HCL 50 MG PO TABS: 25.0000 mg | ORAL_TABLET | Freq: Three times a day (TID) | ORAL | Status: DC | PRN

## 2014-02-27 MED ORDER — MECLIZINE HCL 50 MG PO TABS: 50.0000 mg | ORAL_TABLET | Freq: Three times a day (TID) | ORAL | Status: DC | PRN

## 2014-02-27 MED ORDER — DIAZEPAM 5 MG/ML IJ SOLN
5.0000 mg | Freq: Once | INTRAMUSCULAR | Status: AC
  Administered 2014-02-27: 5 mg via INTRAVENOUS
  Filled 2014-02-27: qty 2

## 2014-02-27 NOTE — ED Notes (Signed)
The pt has had vertigo for the past 3 monthd.  Since 1700 today he has been   voimiting with dizziness still.  Mid-chest pain with n  V  For the past 45 minutes.   Hx cardiac

## 2014-02-27 NOTE — Discharge Instructions (Signed)
You were seen today for vertigo.  You were also evaluated with chest pain associated with vomiting from vertigo.  Your work-up is reassuring.  You will be discharged with IM and meclizine for vertigo symptoms. You should avoid driving when using these medications. You should followup with her primary care physician and her cardiologist regarding today's visit.  Vertigo Vertigo means you feel like you or your surroundings are moving when they are not. Vertigo can be dangerous if it occurs when you are at work, driving, or performing difficult activities.  CAUSES  Vertigo occurs when there is a conflict of signals sent to your brain from the visual and sensory systems in your body. There are many different causes of vertigo, including:  Infections, especially in the inner ear.  A bad reaction to a drug or misuse of alcohol and medicines.  Withdrawal from drugs or alcohol.  Rapidly changing positions, such as lying down or rolling over in bed.  A migraine headache.  Decreased blood flow to the brain.  Increased pressure in the brain from a head injury, infection, tumor, or bleeding. SYMPTOMS  You may feel as though the world is spinning around or you are falling to the ground. Because your balance is upset, vertigo can cause nausea and vomiting. You may have involuntary eye movements (nystagmus). DIAGNOSIS  Vertigo is usually diagnosed by physical exam. If the cause of your vertigo is unknown, your caregiver may perform imaging tests, such as an MRI scan (magnetic resonance imaging). TREATMENT  Most cases of vertigo resolve on their own, without treatment. Depending on the cause, your caregiver may prescribe certain medicines. If your vertigo is related to body position issues, your caregiver may recommend movements or procedures to correct the problem. In rare cases, if your vertigo is caused by certain inner ear problems, you may need surgery. HOME CARE INSTRUCTIONS   Follow your  caregiver's instructions.  Avoid driving.  Avoid operating heavy machinery.  Avoid performing any tasks that would be dangerous to you or others during a vertigo episode.  Tell your caregiver if you notice that certain medicines seem to be causing your vertigo. Some of the medicines used to treat vertigo episodes can actually make them worse in some people. SEEK IMMEDIATE MEDICAL CARE IF:   Your medicines do not relieve your vertigo or are making it worse.  You develop problems with talking, walking, weakness, or using your arms, hands, or legs.  You develop severe headaches.  Your nausea or vomiting continues or gets worse.  You develop visual changes.  A family member notices behavioral changes.  Your condition gets worse. MAKE SURE YOU:  Understand these instructions.  Will watch your condition.  Will get help right away if you are not doing well or get worse. Document Released: 03/12/2005 Document Revised: 08/25/2011 Document Reviewed: 12/19/2010 [facility] Patient Information 2015 [place], [place]. This information is not intended to replace advice given to you by your health care provider. Make sure you discuss any questions you have with your health care provider.

## 2014-02-27 NOTE — ED Notes (Signed)
Patient is alert and orientedx4.  Patient was explained discharge instructions and they understood them with no questions.  The patient's wife, Tor Tsuda is taking the patient home.

## 2014-02-27 NOTE — ED Provider Notes (Signed)
CSN: [id number]     Arrival date & time 02/27/14  0034 History   First MD Initiated Contact with Patient 02/27/14 0046     Chief Complaint  Patient presents with  . Chest Pain     (Consider location/radiation/quality/duration/timing/severity/associated sxs/prior Treatment) HPI  This is a 73 year old male with a history of coronary artery disease and vertigo who presents with vomiting, dizziness, and chest pain. Patient had onset of chest pain approximately 45 minutes prior to arrival. He states that throughout the day he has had worsening room spinning dizziness and nausea. He had onset of vomiting followed by chest pain. He reports chest tightness. Denies any headache, weakness, numbness, or tingling. Denies any shortness of breath. Patient reports persistent difficulty with vertigo over the last several years. He has had workups including MRI and ENT evaluation.  Past Medical History  Diagnosis Date  . Pneumonia     "as a child" (05/06/2012)  . Myocardial infarction 04/21/2012  . Anginal pain 04/21/2012  . Coronary artery disease   . Shortness of breath     "due to this heart attack" (05/06/2012)  . Anemia   . History of blood transfusion 2012    "related to bleeding hemorrhoids" (05/06/2012)  . Bleeding internal hemorrhoids 2012  . H/O hiatal hernia   . 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 Hyperlipidemia    Past Surgical History  Procedure Laterality Date  . Tonsillectomy and adenoidectomy  ~ 1950  . Excisional hemorrhoidectomy  2012    "tried to shrink 1st w/needle; cut them out 2 wk later" (05/06/2012)  . Melanoma excision  2009    "left side of head" (05/06/2012)  . Cardiac catheterization  04/21/2012  . Coronary artery bypass graft  05/10/2012    Procedure: CORONARY ARTERY BYPASS GRAFTING (CABG);  Surgeon: Grace Isaac, MD;  Location: [facility];  Service: Open Heart Surgery;  Laterality: N/A;  times three using Left Greater Saphenous Vein Graft harvested  endoscopically and Left Internal Mammary Artery  . Intraoperative transesophageal echocardiogram  05/10/2012    Procedure: INTRAOPERATIVE TRANSESOPHAGEAL ECHOCARDIOGRAM;  Surgeon: Grace Isaac, MD;  Location: [facility];  Service: Open Heart Surgery;  Laterality: N/A;   Family History  Problem Relation Age of Onset  . Diabetes Mellitus II Mother    History  Substance Use Topics  . Smoking status: Never Smoker   . Smokeless tobacco: Never Used  . Alcohol Use: Yes     Comment: 05/06/2012 "used to drink back years ago; nothing in last 25 yrs"    Review of Systems  Constitutional: Negative.  Negative for fever.  Respiratory: Positive for chest tightness. Negative for shortness of breath.   Cardiovascular: Negative.  Negative for chest pain.  Gastrointestinal: Positive for nausea and vomiting. Negative for abdominal pain.  Genitourinary: Negative.  Negative for dysuria.  Musculoskeletal: Negative for back pain.  Neurological: Positive for dizziness and light-headedness. Negative for headaches.  Psychiatric/Behavioral: Negative for confusion.  All other systems reviewed and are negative.     Allergies  Atorvastatin; Codeine; and Statins  Home Medications   Prior to Admission medications   Medication Sig Start Date End Date Taking? Authorizing Provider  acetaminophen (TYLENOL) 500 MG tablet Take 1,000 mg by mouth every 6 (six) hours as needed (for headache).   Yes Historical Provider, MD  aspirin 81 MG tablet Take 81 mg by mouth daily.   Yes Historical Provider, MD  co-enzyme Q-10 50 MG capsule Take 200 mg by  mouth daily.    Yes Historical Provider, MD  fish oil-omega-3 fatty acids 1000 MG capsule Take 1 g by mouth daily.   Yes Historical Provider, MD  diazepam (VALIUM) 5 MG tablet Take 1 tablet (5 mg total) by mouth every 12 (twelve) hours as needed (vertigo). 02/27/14   Merryl Hacker, MD  meclizine (ANTIVERT) 50 MG tablet Take 0.5 tablets (25 mg total) by mouth 3 (three) times  daily as needed for dizziness. 02/27/14   Merryl Hacker, MD   BP 118/71  Pulse 72  Temp(Src) 97.6 F (36.4 C) (Axillary)  Resp 18  SpO2 97% Physical Exam  Nursing note and vitals reviewed. Constitutional: He is oriented to person, place, and time.  Uncomfortable appearing  HENT:  Head: Normocephalic and atraumatic.  Mouth/Throat: Oropharynx is clear and moist.  Eyes: EOM are normal. Pupils are equal, round, and reactive to light.  +nystagmus  Neck: Neck supple.  Cardiovascular: Normal rate, regular rhythm and normal heart sounds.   No murmur heard. Pulmonary/Chest: Effort normal and breath sounds normal. No respiratory distress. He has no wheezes.  Well healed sternotomy scar  Abdominal: Soft. Bowel sounds are normal. There is no tenderness. There is no rebound.  Musculoskeletal: He exhibits no edema.  Lymphadenopathy:    He has no cervical adenopathy.  Neurological: He is alert and oriented to person, place, and time. No cranial nerve deficit.  Father 5 strength in all 4 extremities, no dysmetria to finger-nose-finger  Skin: Skin is warm and dry.  Psychiatric: He has a normal mood and affect.    ED Course  Procedures (including critical care time) Labs Review Labs Reviewed  CBC WITH DIFFERENTIAL - Abnormal; Notable for the following:    Hemoglobin 11.9 (*)    HCT 37.1 (*)    MCH 25.2 (*)    RDW 15.7 (*)    Neutrophils Relative % 81 (*)    All other components within normal limits  BASIC METABOLIC PANEL - Abnormal; Notable for the following:    Glucose, Bld 146 (*)    GFR calc non Af Amer 86 (*)    Anion gap 16 (*)    All other components within normal limits  TROPONIN I  TROPONIN I    Imaging Review Dg Chest Portable 1 View  02/27/2014   CLINICAL DATA:  Chest pain and vomiting.  EXAM: PORTABLE CHEST - 1 VIEW  COMPARISON:  Chest radiograph performed 09/07/2012  FINDINGS: The lungs are well-aerated and clear. There is no evidence of focal opacification, pleural  effusion or pneumothorax.  The cardiomediastinal silhouette is normal in size. The patient is status post median sternotomy, with evidence of prior CABG. No acute osseous abnormalities are seen.  IMPRESSION: No acute cardiopulmonary process seen.   Electronically Signed   By: Garald Balding M.D.   On: 02/27/2014 01:37     EKG Interpretation   Date/Time:  Monday February 27 2014 00:45:18 EDT Ventricular Rate:  88 PR Interval:  167 QRS Duration: 127 QT Interval:  408 QTC Calculation: 494 R Axis:   1 Text Interpretation:  Sinus rhythm Nonspecific intraventricular conduction  delay Baseline wander in lead(s) V2 V3 No significant change since last  tracing Confirmed by Lylee Corrow  MD, Shadavia Dampier ([id number]) on 02/27/2014 2:39:34 AM     Medications  diazepam (VALIUM) injection 5 mg (5 mg Intravenous Given 02/27/14 0140)  meclizine (ANTIVERT) tablet 25 mg (25 mg Oral Given 02/27/14 0141)  aspirin chewable tablet 324 mg (324 mg Oral Given  02/27/14 0141)    MDM   Final diagnoses:  Vertigo  Chest pain, unspecified chest pain type    Patient presents with vertigo symptoms and chest pain. History of vertigo which has been poorly controlled. Also has a history of coronary artery disease.  Patient was given full dose aspirin, Valium, and Antivert. EKG is unchanged from prior without acute ischemic changes. Chest x-ray is reassuring. Patient is nonfocal on exam and reports having a full prior workup with MRI and ENT evaluation. On repeat exam, patient reports resolution of chest pain as well as improvement of vertiginous symptoms. He is no longer vomiting. Given that this pain occurred following multiple episodes of emesis, suspect it may be related. Atypical for ACS.  Given history however, will obtain 2 sets of troponins.    On recheck, patient continues to be more comfortable. Second troponin is negative. Still he can be discharged home with close followup. Patient will be given a prescription for meclizine  and Valium.  After history, exam, and medical workup I feel the patient has been appropriately medically screened and is safe for discharge home. Pertinent diagnoses were discussed with the patient. Patient was given return precautions.    Merryl Hacker, MD 03/01/14 0900

## 2014-03-23 ENCOUNTER — Ambulatory Visit (INDEPENDENT_AMBULATORY_CARE_PROVIDER_SITE_OTHER): Payer: Medicare Other | Admitting: Interventional Cardiology

## 2014-03-23 ENCOUNTER — Encounter: Payer: Self-pay | Admitting: Interventional Cardiology

## 2014-03-23 VITALS — BP 118/78 | HR 73 | Ht 66.0 in | Wt 156.4 lb

## 2014-03-23 DIAGNOSIS — R42 Dizziness and giddiness: Secondary | ICD-10-CM | POA: Insufficient documentation

## 2014-03-23 DIAGNOSIS — Z951 Presence of aortocoronary bypass graft: Secondary | ICD-10-CM

## 2014-03-23 DIAGNOSIS — E785 Hyperlipidemia, unspecified: Secondary | ICD-10-CM

## 2014-03-23 HISTORY — DX: Presence of aortocoronary bypass graft: Z95.1

## 2014-03-23 LAB — LIPID PANEL
Cholesterol: 266 mg/dL — ABNORMAL HIGH (ref 0–200)
HDL: 57.7 mg/dL (ref >39.00)
LDL Cholesterol: 188 mg/dL — ABNORMAL HIGH (ref 0–99)
NONHDL: 208.3
Total CHOL/HDL Ratio: 5
Triglycerides: 100 mg/dL (ref 0.0–149.0)
VLDL: 20 mg/dL (ref 0.0–40.0)

## 2014-03-23 NOTE — Progress Notes (Signed)
Patient ID: Keith Cunningham, male   DOB: [birth year], 73 y.o.   MRN: [id number]    [address], [place]  [address] Phone: [phone] Fax:  [phone]  Date:  03/23/2014   ID:  Keith Cunningham, DOB 11/06/1940, MRN [id number]  PCP:  Donnie Coffin, MD   ASSESSMENT:  1. Coronary artery disease with four-vessel bypass surgery 2014, asymptomatic 2. Hyperlipidemia, not currently being treated with statin therapy due to medication intolerance 3. 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 lipid panel. If significantly elevated will refer for consideration of PSVT 9 initiation. 2. Clinic followup in one year 3. Encouraged physical activity   SUBJECTIVE: Keith Cunningham is a 73 y.o. male who had bypass surgery a year and a half ago and is doing well without angina. He had post cardiotomy syndrome. He was started on statin therapy after surgery and cannot tolerate. He has musculoskeletal chest discomfort. He remains active. He is able to play golf with his friends. He has not had stroke or neurological complaints other than vertigo. An ENT evaluation included an MRI of the brain that was unremarkable. He has not had palpitations.   Wt Readings from Last 3 Encounters:  03/23/14 156 lb 6.4 oz (70.943 kg)  09/07/12 145 lb (65.772 kg)  08/05/12 145 lb (65.772 kg)     Past Medical History  Diagnosis Date  . Pneumonia     "as a child" (05/06/2012)  . Myocardial infarction 04/21/2012  . Anginal pain 04/21/2012  . Coronary artery disease   . Shortness of breath     "due to this heart attack" (05/06/2012)  . Anemia   . History of blood transfusion 2012    "related to bleeding hemorrhoids" (05/06/2012)  . Bleeding internal hemorrhoids 2012  . H/O hiatal hernia   . 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 Hyperlipidemia     Current Outpatient Prescriptions  Medication Sig Dispense Refill  . acetaminophen (TYLENOL) 500 MG tablet  Take 1,000 mg by mouth every 6 (six) hours as needed (for headache).      Marland Kitchen aspirin 81 MG tablet Take 81 mg by mouth daily.      Marland Kitchen co-enzyme Q-10 50 MG capsule Take 200 mg by mouth daily.       . fish oil-omega-3 fatty acids 1000 MG capsule Take 1 g by mouth daily.      . diazepam (VALIUM) 5 MG tablet Take 1 tablet (5 mg total) by mouth every 12 (twelve) hours as needed (vertigo).  15 tablet  0  . meclizine (ANTIVERT) 50 MG tablet Take 0.5 tablets (25 mg total) by mouth 3 (three) times daily as needed for dizziness.  30 tablet  0   No current facility-administered medications for this visit.    Allergies:    Allergies  Allergen Reactions  . Atorvastatin Nausea And Vomiting    CONSTIPATION FATIGUE  . Codeine Nausea And Vomiting  . Statins Other (See Comments)    Myalgias     Social History:  The patient  reports that he has never smoked. He has never used smokeless tobacco. He reports that he drinks alcohol. He reports that he does not use illicit drugs.   ROS:  Please see the history of present illness.   Denies claudication, syncope, and dyspnea. Has had 3 emergency room visits because of acute vertigo.   All other systems reviewed and negative.   OBJECTIVE:  VS:  BP 118/78  Pulse 73  Ht 5\' 6"  (1.676 m)  Wt 156 lb 6.4 oz (70.943 kg)  BMI 25.26 kg/m2 Well nourished, well developed, in no acute distress, appears healthy HEENT: normal Neck: JVD flat. Carotid bruit absent  Cardiac:  normal S1, S2; RRR; no murmur Lungs:  clear to auscultation bilaterally, no wheezing, rhonchi or rales Abd: soft, nontender, no hepatomegaly Ext: Edema absent. Pulses 2+ Skin: warm and dry Neuro:  CNs 2-12 intact, no focal abnormalities noted  EKG:  EKG from September reveals a wandering baseline incomplete right bundle and no change from prior. Normal sinus rhythm       Signed, Illene Labrador III, MD 03/23/2014 8:42 AM

## 2014-03-23 NOTE — Patient Instructions (Signed)
Your physician recommends that you continue on your current medications as directed. Please refer to the Current Medication list given to you today.  Lab Today: Lipid  Your physician wants you to follow-up in: 1 year with Dr.Smith You will receive a reminder letter in the mail two months in advance. If you don't receive a letter, please call our office to schedule the follow-up appointment.

## 2014-04-12 ENCOUNTER — Telehealth: Payer: Self-pay

## 2014-04-12 NOTE — Telephone Encounter (Signed)
Message copied by Lamar Laundry on Wed Apr 12, 2014  9:43 AM ------      Message from: Daneen Schick      Created: Mon Apr 03, 2014  9:55 AM       Refer to lipid clinic to start PSK - 9 ------

## 2014-04-12 NOTE — Telephone Encounter (Signed)
called to give pt lab results and Dr.Smith instructions. pt sts that he will need to call me back later

## 2014-04-18 NOTE — Telephone Encounter (Signed)
-----   Message from Sinclair Grooms, MD sent at 04/03/2014  9:55 AM EDT ----- Refer to lipid clinic to start PSK - 9

## 2014-04-18 NOTE — Telephone Encounter (Signed)
Pt aware of lab results and Dr.Snmith recommendations.Refer to lipid clinic to start PSK - 9.pt does not want to be referred. Pt sts that he does not do want to start any new anti-lipids meds.pt sts that he is not able to tolerate med side effects.pt adv of the risk of not being on a anti-lipid.pt adv of the benefit of the psk-9 medications.pt still refuses.adv pt that I will fwd an update to Dr.Smith and call back if he has additional recommendations.pt verbalized understanding.

## 2014-04-19 NOTE — Telephone Encounter (Signed)
Noted and no further recommendations.

## 2014-07-06 ENCOUNTER — Other Ambulatory Visit: Payer: Self-pay | Admitting: Otolaryngology

## 2014-07-06 DIAGNOSIS — H9190 Unspecified hearing loss, unspecified ear: Secondary | ICD-10-CM

## 2014-07-06 DIAGNOSIS — R42 Dizziness and giddiness: Secondary | ICD-10-CM

## 2014-07-14 ENCOUNTER — Ambulatory Visit
Admission: RE | Admit: 2014-07-14 | Discharge: 2014-07-14 | Disposition: A | Discharge status: Home / Self Care | Payer: Medicare Other | Source: Ambulatory Visit | Attending: Otolaryngology | Admitting: Otolaryngology

## 2014-07-14 DIAGNOSIS — R42 Dizziness and giddiness: Secondary | ICD-10-CM

## 2014-07-14 DIAGNOSIS — H9190 Unspecified hearing loss, unspecified ear: Secondary | ICD-10-CM

## 2014-07-14 MED ORDER — IOHEXOL 350 MG/ML SOLN
75.0000 mL | Freq: Once | INTRAVENOUS | Status: AC | PRN
  Administered 2014-07-14: 75 mL via INTRAVENOUS

## 2014-08-08 ENCOUNTER — Ambulatory Visit: Payer: Medicare Other | Attending: Otolaryngology | Admitting: Rehabilitative and Restorative Service Providers"

## 2014-08-08 ENCOUNTER — Encounter: Payer: Self-pay | Admitting: Rehabilitative and Restorative Service Providers"

## 2014-08-08 DIAGNOSIS — R269 Unspecified abnormalities of gait and mobility: Secondary | ICD-10-CM

## 2014-08-08 DIAGNOSIS — R42 Dizziness and giddiness: Secondary | ICD-10-CM | POA: Diagnosis not present

## 2014-08-08 NOTE — Therapy (Signed)
[facility] [address] [place], [place], [address] Phone: [phone]   Fax:  [phone]  Physical Therapy Evaluation  Patient Details  Name: Keith Cunningham MRN: [id number] Date of Birth: 1940/09/14 Referring Provider:  Donnie Coffin, MD  Encounter Date: 08/08/2014      PT End of Session - 08/08/14 5670    Visit Number 1   Number of Visits 8   Date for PT Re-Evaluation 10/07/14   PT Start Time 0938   PT Stop Time 1022   PT Time Calculation (min) 44 min   Activity Tolerance Patient tolerated treatment well   Behavior During Therapy [facility] for tasks assessed/performed      Past Medical History  Diagnosis Date  . Pneumonia     "as a child" (05/06/2012)  . Myocardial infarction 04/21/2012  . Anginal pain 04/21/2012  . Coronary artery disease   . Shortness of breath     "due to this heart attack" (05/06/2012)  . Anemia   . History of blood transfusion 2012    "related to bleeding hemorrhoids" (05/06/2012)  . Bleeding internal hemorrhoids 2012  . H/O hiatal hernia   . 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 Hyperlipidemia     Past Surgical History  Procedure Laterality Date  . Tonsillectomy and adenoidectomy  ~ 1950  . Excisional hemorrhoidectomy  2012    "tried to shrink 1st w/needle; cut them out 2 wk later" (05/06/2012)  . Melanoma excision  2009    "left side of head" (05/06/2012)  . Cardiac catheterization  04/21/2012  . Coronary artery bypass graft  05/10/2012    Procedure: CORONARY ARTERY BYPASS GRAFTING (CABG);  Surgeon: Grace Isaac, MD;  Location: [facility];  Service: Open Heart Surgery;  Laterality: N/A;  times three using Left Greater Saphenous Vein Graft harvested endoscopically and Left Internal Mammary Artery  . Intraoperative transesophageal echocardiogram  05/10/2012    Procedure: INTRAOPERATIVE TRANSESOPHAGEAL ECHOCARDIOGRAM;  Surgeon: Grace Isaac, MD;  Location: [facility];  Service:  Open Heart Surgery;  Laterality: N/A;    There were no vitals taken for this visit.  Visit Diagnosis:  Abnormality of gait      Subjective Assessment - 08/08/14 0940    Symptoms The patient reports awakening from surgery > 2 years ago with dizziness, R sided hearing loss (that has continued to diminish to 0%), 60% L hearing loss, and L arm weakness.  He reports he has intermittent episodes of dizziness, nausea/vomitting, and difficulty walking.  Reading, computer use, television (x hours) aggravates dizziness.  He reports a car improves dizziness (if he is driving).  He gets spells of dizziness atleast 1x/week where he has to go to bed x 8 hours at a time.  His wife reports he has h/o headaches, but no diagnosis of migraines. His wife reports he would get headaches that last 2-3 days (before heart attack).   Pertinent History L arm weakness since heart surgery > 2 years ago   Patient Stated Goals Decrease dizziness, learn to manage dizziness.   Currently in Pain? No/denies          [facility] PT Assessment - 08/08/14 0948    Assessment   Medical Diagnosis vertigo   Onset Date --  2 years ago   Prior Therapy --  tried vestibular rehab x 2 visits; prior ex did not help   Balance Screen   Has the patient fallen in the past 6 months No  Has the patient had a decrease in activity level because of a fear of falling?  Yes   Is the patient reluctant to leave their home because of a fear of falling?  Yes   [place] Private residence   Type of [place] Access Level entry   [place] One level   Prior Function   Level of Independence Independent with gait   Vocation Retired  working part time 2 days/week   Observation/Other Assessments   Focus on Therapeutic Outcomes (FOTO)  81%   Other Surveys  --  DHI=84%            Vestibular Assessment - 08/08/14 0952    Symptom Behavior   Type of Dizziness --  "normal" dizziness baseline; room spins  when severe   Frequency of Dizziness --  constant sensation is himself moving, severe= room moving   Duration of Dizziness --  hours   Aggravating Factors Spontaneous onset  of spinning   Relieving Factors Lying supine  x hours to days   Occulomotor Exam   Occulomotor Alignment Abnormal  R eye abducted, 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 Positional vertigo testing based on symptoms               Dix-Hallpike Right   Dix-Hallpike Right Symptoms No nystagmus  3-4/10 symtpoms of general dizziness   Dix-Hallpike Left   Dix-Hallpike Left Symptoms No nystagmus  3-4/10 general dizziness   Sidelying Right   Sidelying Right Symptoms Upbeat, right rotatory nystagmus  3-5 beats viewed in room light   Sidelying Left   Sidelying Left Symptoms No nystagmus   Horizontal Canal Right   Horizontal Canal Right Symptoms --  No nystagmus, 3-4/10 general dizziness   Horizontal Canal Left   Horizontal Canal Left Symptoms --  No nystagmus, 3-4/10 general dizziness                      PT Education - 08/08/14 1437    Education provided Yes   Education Details Goals of PT and plan for HEP.   Person(s) Educated Patient;Spouse   Methods Explanation   Comprehension Verbalized understanding          PT Short Term Goals - 08/09/14 1431    PT SHORT TERM GOAL #1   Title The patient will be indep with HEP for habituation for motion tolerance, gaze activities, dynamic gait/balance.  Target date 09/07/2014   Time 4   Period Weeks   PT SHORT TERM GOAL #2   Title The patient will be further assessed on SVA vs. DVA and goal to follow, as indicated.  Target date 09/07/2014.   Time  4   Period Weeks   PT SHORT TERM GOAL #3   Title The patient will undergo further balance testing (Berg, sensory organization, dynamic gait)- goal to follow, as indicated.  Target date 09/07/2014   Time 4   Period Weeks           PT Long Term Goals - 08/09/14 1436    PT LONG TERM GOAL #1  Title The patient will be indep with post d/c program for home exercise.  Target date 10/08/2014   Time 8   Period Weeks   PT LONG TERM GOAL #2   Title The patient will reduce DHI from 84% to 60% to demo subjective improvement in symptoms.  Target date 10/08/2014   Time 8   Period Weeks   PT LONG TERM GOAL #3   Title The patient will tolerate sit<>bilateral sidelyin and rolling R<>L with dizziness < or equal to 2/10.  Target date 10/08/2014.   Time 8   Period Weeks               Plan - 08-28-2014 1441    Clinical Impression Statement The patient is a 74 yo male presenting with multifactorial dizziness.  He reports a baseline "general dizziness" that is rated 3-4/10 at rest that is described as patient moving that is improved with being busy (distracted) and worse after sitting for longer periods.  He also has episodic room spinning that lasts hours and occurs 1-2x/week.  In addition to these 2 sensations, he also has lightheadedness with return to sitting and imbalance upon rising.     Pt will benefit from skilled therapeutic intervention in order to improve on the following deficits Abnormal gait;Decreased balance;Difficulty walking;Decreased mobility;Postural dysfunction;Other (comment)  dizziness   Rehab Potential Good   PT Frequency 1x / week   PT Duration 8 weeks   PT Treatment/Interventions Gait training;Therapeutic exercise;Balance training;Neuromuscular re-education;Functional mobility training;Therapeutic activities;Patient/family education;Other (comment)  vestibular rehab   PT Next Visit Plan HEP: Gaze x 1 with letter exercise, Standing head turns, habituation HEP for sit<>bilateral  sidelying, postural stretching for posterior neck stretching.   Consulted and Agree with Plan of Care Patient          G-Codes - 08/28/14 1451    Functional Assessment Tool Used DHI=84%   Functional Limitation Self care   Self Care Current Status [phone]) At least 40 percent but less than 60 percent impaired, limited or restricted   Self Care Goal Status ([id number]) At least 1 percent but less than 20 percent impaired, limited or restricted       Problem List Patient Active Problem List   Diagnosis Date Noted  . 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 Vertigo 03/23/2014  . Hyperlipidemia 03/23/2014  . 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 Abnormal stress test 05/06/2012  . Coronary atherosclerosis of native coronary artery 05/06/2012    Class: Acute  . Chest pain 04/22/2012  . Iron deficiency anemia 04/22/2012    Jaquaya Coyle, PT 08/09/2014, 2:39 PM  [facility] [address] [place], [place], [address] Phone: [phone]   Fax:  [phone]

## 2014-08-16 ENCOUNTER — Ambulatory Visit: Payer: Medicare Other | Attending: Otolaryngology | Admitting: Rehabilitative and Restorative Service Providers"

## 2014-08-16 DIAGNOSIS — R269 Unspecified abnormalities of gait and mobility: Secondary | ICD-10-CM | POA: Diagnosis not present

## 2014-08-16 DIAGNOSIS — R42 Dizziness and giddiness: Secondary | ICD-10-CM | POA: Insufficient documentation

## 2014-08-16 NOTE — Patient Instructions (Signed)
Gaze Stabilization: Tip Card 1.Target must remain in focus, not blurry, and appear stationary while head is in motion. 2.Perform exercises with small head movements (45 to either side of midline). 3.Increase speed of head motion so long as target is in focus. 4.If you wear eyeglasses, be sure you can see target through lens (therapist will give specific instructions for bifocal / progressive lenses). 5.These exercises may provoke dizziness or nausea. Work through these symptoms. If too dizzy, slow head movement slightly. Rest between each exercise. 6.Exercises demand concentration; avoid distractions. 7.For safety, perform standing exercises close to a counter, wall, corner, or next to someone.  Copyright  VHI. All rights reserved.  Gaze Stabilization: Standing Feet Apart   Feet shoulder width apart, keeping eyes on target on wall __3-5__ feet away and move head side to side for __30__ seconds (small range of motion). Repeat while moving head up and down for _30___ seconds. Do _3___ sessions per day.  Copyright  VHI. All rights reserved.  Feet Apart (Compliant Surface) Varied Arm Positions - Eyes Closed   Stand on compliant surface: __on a pillow______ with feet shoulder width apart and arms at your side. Close eyes and visualize upright position. Hold_30___ seconds. Repeat _3___ times per session. Do _1-2___ sessions per day.  Copyright  VHI. All rights reserved.  Feet Apart (Compliant Surface) Head Motion - Eyes Open   With eyes open, standing on compliant surface: _on a pillow_______, feet shoulder width apart, move head slowly: up and down and then side to side. Repeat __10__ times per session. Do __1-2__ sessions per day.  Copyright  VHI. All rights reserved.  Neck Side-Bending   LOOK IN THE MIRROR TO KEEP SHOULDER DOWN.  Begin with chin level, head centered over spine. Slowly lower one ear toward shoulder. Hold __10__ seconds. Slowly return to starting position. Repeat to  other side.  *Can also look towards your shoulder (as if bringing your chin to your shoulder). Repeat __5__ times to each side. 1-2 x/day.  Copyright  VHI. All rights reserved.  Active Neck Rotation   SIT UP TALL FIRST.  With head in a comfortable position and chin gently tucked in, rotate head to the right. Hold __10__ seconds. Repeat to the left. Repeat _5___ times. Do __1-2__ sessions per day.  http://gt2.exer.us/10   Copyright  VHI. All rights reserved.  Axial Extension (Chin Tuck)   Lying down with a pillow or your wife's hands under your hand.  Pull chin in and lengthen back of neck. Hold _3-5___ seconds while counting out loud. Repeat __10__ times. Do __1-2__ sessions per day.  http://gt2.exer.us/449   Copyright  VHI. All rights reserved.   *Lie on your back in the middle of the bed with arms wide and palms up.  Hold 2 minutes, 1-2 x/day.  Special Instructions: Exercises may bring on mild to moderate symptoms of dizziness that resolve within 30 minutes of completing exercises. If symptoms are lasting longer than 30 minutes, modify your exercises by:  >decreasing the # of times you complete each activity >ensuring your symptoms return to baseline before moving onto the next exercise >dividing up exercises so you do not do them all in one session, but multiple short sessions throughout the day >doing them once a day until symptoms improve

## 2014-08-16 NOTE — Therapy (Signed)
[facility] [address] [place], [place], [address] Phone: [phone]   Fax:  [phone]  Physical Therapy Treatment  Patient Details  Name: Keith Cunningham MRN: [id number] Date of Birth: October 05, 1940 Referring Provider:  Donnie Coffin, MD  Encounter Date: 08/16/2014      PT End of Session - 08/17/14 1435    Visit Number 2   Number of Visits 8   Date for PT Re-Evaluation 10/07/14   PT Start Time 0800   PT Stop Time 0845   PT Time Calculation (min) 45 min   Activity Tolerance Patient tolerated treatment well   Behavior During Therapy [facility] for tasks assessed/performed      Past Medical History  Diagnosis Date  . Pneumonia     "as a child" (05/06/2012)  . Myocardial infarction 04/21/2012  . Anginal pain 04/21/2012  . Coronary artery disease   . Shortness of breath     "due to this heart attack" (05/06/2012)  . Anemia   . History of blood transfusion 2012    "related to bleeding hemorrhoids" (05/06/2012)  . Bleeding internal hemorrhoids 2012  . H/O hiatal hernia   . 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 Hyperlipidemia     Past Surgical History  Procedure Laterality Date  . Tonsillectomy and adenoidectomy  ~ 1950  . Excisional hemorrhoidectomy  2012    "tried to shrink 1st w/needle; cut them out 2 wk later" (05/06/2012)  . Melanoma excision  2009    "left side of head" (05/06/2012)  . Cardiac catheterization  04/21/2012  . Coronary artery bypass graft  05/10/2012    Procedure: CORONARY ARTERY BYPASS GRAFTING (CABG);  Surgeon: Grace Isaac, MD;  Location: [facility];  Service: Open Heart Surgery;  Laterality: N/A;  times three using Left Greater Saphenous Vein Graft harvested endoscopically and Left Internal Mammary Artery  . Intraoperative transesophageal echocardiogram  05/10/2012    Procedure: INTRAOPERATIVE TRANSESOPHAGEAL ECHOCARDIOGRAM;  Surgeon: Grace Isaac, MD;  Location: [facility];  Service:  Open Heart Surgery;  Laterality: N/A;    There were no vitals taken for this visit.  Visit Diagnosis:  Abnormality of gait      Subjective Assessment - 08/16/14 0800    Symptoms The patient reports he had to go to bed Thursday night, Friday night, and Sunday morning.  "I can't do anything then" and needs help to get to the bed.  He reports a general sensation of roaring in his head as if holding a seashell to his ear.  Driving improves symtpoms.   Currently in Pain? No/denies       NEUROMUSCULAR RE-EDUCATION: Corner on pillow with eyes closed (attempted feet together, but too challenging for home) Corner pillow with head turns exercises Gaze x 1 viewing in horizontal and vertical planes x 30 seconds with cues on posture, head/neck ROM and technqiue Narrow base of support standing  THERAPEUTIC EXERCISE: Neck chin tucks supine with manual resistance Neck A/ROM rotation Neck A/ROM sidebending Shoulder circles  SELF CARE/HOME MANAGEMENT: Discussed progression of exercises and continuing x 3 weeks for vestibular adaptation.  Educated on postural activities and awareness of posture. Education re: PT progression           PT Education - 08/17/14 1434    Education provided Yes   Education Details HEP: chick tucks supine, A/ROM neck rotation and sidebending, gaze x 1 adaptation, corner pillow with head turns, corner pillow with eyes closed  Person(s) Educated Patient;Spouse   Methods Explanation;Demonstration;Handout   Comprehension Returned demonstration;Verbalized understanding          PT Short Term Goals - 08/09/14 1431    PT SHORT TERM GOAL #1   Title The patient will be indep with HEP for habituation for motion tolerance, gaze activities, dynamic gait/balance.  Target date 09/07/2014   Time 4   Period Weeks   PT SHORT TERM GOAL #2   Title The patient will be further assessed on SVA vs. DVA and goal to follow, as indicated.  Target date 09/07/2014.   Time 4    Period Weeks   PT SHORT TERM GOAL #3   Title The patient will undergo further balance testing (Berg, sensory organization, dynamic gait)- goal to follow, as indicated.  Target date 09/07/2014   Time 4   Period Weeks           PT Long Term Goals - 08/09/14 1436    PT LONG TERM GOAL #1   Title The patient will be indep with post d/c program for home exercise.  Target date 10/08/2014   Time 8   Period Weeks   PT LONG TERM GOAL #2   Title The patient will reduce DHI from 84% to 60% to demo subjective improvement in symptoms.  Target date 10/08/2014   Time 8   Period Weeks   PT LONG TERM GOAL #3   Title The patient will tolerate sit<>bilateral sidelyin and rolling R<>L with dizziness < or equal to 2/10.  Target date 10/08/2014.   Time 8   Period Weeks               Plan - 08/17/14 1456    Clinical Impression Statement The patient tolerated HEP well with increased unsteadiness s/p gaze activities.  PT focusing on balance and neck to improve postural support and general tolerance to movement.   PT Next Visit Plan Check HEP: Add habituation if indicated, progress balance ex, postural stabilization and add dynamic gait.   Consulted and Agree with Plan of Care Patient;Family member/caregiver   Family Member Consulted spouse        Problem List Patient Active Problem List   Diagnosis Date Noted  . 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 Vertigo 03/23/2014  . Hyperlipidemia 03/23/2014  . 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 Abnormal stress test 05/06/2012  . Coronary atherosclerosis of native coronary artery 05/06/2012    Class: Acute  . Chest pain 04/22/2012  . Iron deficiency anemia 04/22/2012    Ludmila Ebarb, PT 08/17/2014, 2:59 PM  [facility] [address] [place], [place], [address] Phone: [phone]   Fax:  [phone]

## 2014-09-06 ENCOUNTER — Ambulatory Visit: Payer: Medicare Other | Admitting: Rehabilitative and Restorative Service Providers"

## 2014-09-06 DIAGNOSIS — R42 Dizziness and giddiness: Secondary | ICD-10-CM | POA: Diagnosis not present

## 2014-09-06 DIAGNOSIS — R269 Unspecified abnormalities of gait and mobility: Secondary | ICD-10-CM

## 2014-09-06 NOTE — Therapy (Signed)
[facility] [address] [place], [place], [address] Phone: [phone]   Fax:  [phone]  Physical Therapy Treatment  Patient Details  Name: Keith Cunningham MRN: [id number] Date of Birth: 1940/07/02 Referring Provider:  Alroy Dust, L.Marlou Sa, MD  Encounter Date: 09/06/2014      PT End of Session - 09/06/14 1120    Visit Number 3   Number of Visits 8   Date for PT Re-Evaluation 10/07/14   PT Start Time 0803   PT Stop Time 0850   PT Time Calculation (min) 47 min   Activity Tolerance Patient tolerated treatment well   Behavior During Therapy [facility] for tasks assessed/performed      Past Medical History  Diagnosis Date  . Pneumonia     "as a child" (05/06/2012)  . Myocardial infarction 04/21/2012  . Anginal pain 04/21/2012  . Coronary artery disease   . Shortness of breath     "due to this heart attack" (05/06/2012)  . Anemia   . History of blood transfusion 2012    "related to bleeding hemorrhoids" (05/06/2012)  . Bleeding internal hemorrhoids 2012  . H/O hiatal hernia   . 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 Hyperlipidemia     Past Surgical History  Procedure Laterality Date  . Tonsillectomy and adenoidectomy  ~ 1950  . Excisional hemorrhoidectomy  2012    "tried to shrink 1st w/needle; cut them out 2 wk later" (05/06/2012)  . Melanoma excision  2009    "left side of head" (05/06/2012)  . Cardiac catheterization  04/21/2012  . Coronary artery bypass graft  05/10/2012    Procedure: CORONARY ARTERY BYPASS GRAFTING (CABG);  Surgeon: Grace Isaac, MD;  Location: [facility];  Service: Open Heart Surgery;  Laterality: N/A;  times three using Left Greater Saphenous Vein Graft harvested endoscopically and Left Internal Mammary Artery  . Intraoperative transesophageal echocardiogram  05/10/2012    Procedure: INTRAOPERATIVE TRANSESOPHAGEAL ECHOCARDIOGRAM;  Surgeon: Grace Isaac, MD;  Location: [facility];  Service:  Open Heart Surgery;  Laterality: N/A;    There were no vitals filed for this visit.  Visit Diagnosis:  Abnormality of gait      Subjective Assessment - 09/06/14 0807    Symptoms The patient reports he is still having some spells of dizziness, however frequency and intensity are slowly improving.  He has not had vomitting with spells.  He noted last bad spell was Monday after being on the computer for 30 minutes + reading 3 magazines.     Currently in Pain? No/denies      NEUROMUSCULAR RE-EDUCATION: Pt reports driving improves symptoms.  PT reviewed HEP and discussed progression of activities. Patient added standing gaze x 1 with busy background to HEP,  Wall bumps with eyes closed Diagonal reaching to foot and then contralateral side overhead x 5 reps Tried x 2 viewing (with minimal symptoms) Tried eyes/head compensatory saccades x 5 reps without symptoms and smooth pursuits without symptoms  Recommended continue prior HeP.        PT Education - 09/06/14 0848    Education provided Yes   Education Details HEP: diagonals, busy background gaze x 1 viewing, wall bumps eyes closed   Person(s) Educated Patient;Spouse   Methods Explanation;Demonstration;Handout   Comprehension Verbalized understanding;Returned demonstration          PT Short Term Goals - 09/06/14 1121    PT SHORT TERM GOAL #1   Title The patient will  be indep with HEP for habituation for motion tolerance, gaze activities, dynamic gait/balance.  Target date 09/07/2014   Baseline met on 09/06/2014   Time 4   Period Weeks   Status Achieved   PT SHORT TERM GOAL #2   Title The patient will be further assessed on SVA vs. DVA and goal to follow, as indicated.  Target date 09/07/2014.   Time 4   Period Weeks   Status On-going   PT SHORT TERM GOAL #3   Title The patient will undergo further balance testing (Berg, sensory organization, dynamic gait)- goal to follow, as indicated.  Target date 09/07/2014   Baseline  Patient responding well to initial HEP.  PT progressing HEP with minimal visits due to high co pay.    Time 4   Period Weeks   Status Deferred           PT Long Term Goals - 08/09/14 1436    PT LONG TERM GOAL #1   Title The patient will be indep with post d/c program for home exercise.  Target date 10/08/2014   Time 8   Period Weeks   PT LONG TERM GOAL #2   Title The patient will reduce DHI from 84% to 60% to demo subjective improvement in symptoms.  Target date 10/08/2014   Time 8   Period Weeks   PT LONG TERM GOAL #3   Title The patient will tolerate sit<>bilateral sidelyin and rolling R<>L with dizziness < or equal to 2/10.  Target date 10/08/2014.   Time 8   Period Weeks               Plan - 09/06/14 1123    Clinical Impression Statement The patient feels that posture is improving, # and intensity of dizzy spells are also decreasing. PT to continue to progress HEP to patient tolerance.   PT Next Visit Plan Check HEP: Add habituation if indicated, progress balance ex, postural stabilization and add dynamic gait.   Consulted and Agree with Plan of Care Patient;Family member/caregiver   Family Member Consulted spouse        Problem List Patient Active Problem List   Diagnosis Date Noted  . 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 Vertigo 03/23/2014  . Hyperlipidemia 03/23/2014  . 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 Abnormal stress test 05/06/2012  . Coronary atherosclerosis of native coronary artery 05/06/2012    Class: Acute  . Chest pain 04/22/2012  . Iron deficiency anemia 04/22/2012    Eytan Carrigan, PT 09/06/2014, 11:23 AM  [facility] [address] [place], [place], [address] Phone: [phone]   Fax:  [phone]

## 2014-09-06 NOTE — Patient Instructions (Signed)
(  Home) Combined: Diagonal Lift   No Pulley. Reach to the left lower leg/foot and then overhead to the right.  Repeat 5 times.  Rest.  Repeat 5 more times. Then reverse to do to the other side.  Copyright  VHI. All rights reserved.  Weight Shift: Anterior / Posterior (Righting / Equilibrium)   Shift weight backward leaning against the wall.  Close eyes.  Bring your body away from the wall and balance.  Repeat 10 times.   Copyright  VHI. All rights reserved.   Gaze Stabilization: Standing Feet Apart   *Same exercise, but also add busy background*  Copyright  VHI. All rights reserved.

## 2014-10-04 ENCOUNTER — Ambulatory Visit: Payer: Medicare Other | Admitting: Rehabilitative and Restorative Service Providers"

## 2014-10-24 ENCOUNTER — Encounter: Payer: Medicare Other | Admitting: Rehabilitative and Restorative Service Providers"

## 2014-10-24 NOTE — Therapy (Signed)
[facility] [address] [place], [place], [address] Phone: [phone]   Fax:  [phone]  Patient Details  Name: Keith Cunningham MRN: [id number] Date of Birth: 08/07/1940 Referring Provider:  No ref. provider found  Encounter Date: 10/24/2014  PHYSICAL THERAPY DISCHARGE SUMMARY  Visits from Start of Care: 4  Current functional level related to goals / functional outcomes: *Patient called to cancel remaining visits due to HEP progressing well.  Goals not all assessed due to patient not returning for therapy.     PT Short Term Goals - 09/06/14 1121    PT SHORT TERM GOAL #1   Title The patient will be indep with HEP for habituation for motion tolerance, gaze activities, dynamic gait/balance.  Target date 09/07/2014   Baseline met on 09/06/2014   Time 4   Period Weeks   Status Achieved   PT SHORT TERM GOAL #2   Title The patient will be further assessed on SVA vs. DVA and goal to follow, as indicated.  Target date 09/07/2014.   Time 4   Period Weeks   Status On-going   PT SHORT TERM GOAL #3   Title The patient will undergo further balance testing (Berg, sensory organization, dynamic gait)- goal to follow, as indicated.  Target date 09/07/2014   Baseline Patient responding well to initial HEP.  PT progressing HEP with minimal visits due to high co pay.    Time 4   Period Weeks   Status Deferred          PT Long Term Goals - 08/09/14 1436    PT LONG TERM GOAL #1   Title The patient will be indep with post d/c program for home exercise.  Target date 10/08/2014   Time 8   Period Weeks   PT LONG TERM GOAL #2   Title The patient will reduce DHI from 84% to 60% to demo subjective improvement in symptoms.  Target date 10/08/2014   Time 8   Period Weeks   PT LONG TERM GOAL #3   Title The patient will tolerate sit<>bilateral sidelyin and rolling R<>L with dizziness < or equal to 2/10.  Target date 10/08/2014.   Time 8    Period Weeks        Remaining deficits: See eval and above for patient status.   Education / Equipment: HEP.  Plan: Patient agrees to discharge.  Patient goals were not retested due to patient not returning. not met.*Patient is being discharged due to being pleased with the current functional level.  ?????     Thank you for the referral of this patient.   [place], [place] 10/24/2014, 9:14 AM  [facility] [address] [place], [place], [address] Phone: [phone]   Fax:  [phone]

## 2015-04-04 ENCOUNTER — Encounter: Payer: Self-pay | Admitting: Interventional Cardiology

## 2015-04-04 ENCOUNTER — Ambulatory Visit (INDEPENDENT_AMBULATORY_CARE_PROVIDER_SITE_OTHER): Payer: Medicare Other | Admitting: Interventional Cardiology

## 2015-04-04 VITALS — BP 122/74 | HR 74 | Ht 66.0 in | Wt 158.4 lb

## 2015-04-04 DIAGNOSIS — Z951 Presence of aortocoronary bypass graft: Secondary | ICD-10-CM | POA: Diagnosis not present

## 2015-04-04 DIAGNOSIS — E785 Hyperlipidemia, unspecified: Secondary | ICD-10-CM | POA: Diagnosis not present

## 2015-04-04 DIAGNOSIS — H5440 Blindness, one eye, unspecified eye: Secondary | ICD-10-CM

## 2015-04-04 DIAGNOSIS — R42 Dizziness and giddiness: Secondary | ICD-10-CM | POA: Diagnosis not present

## 2015-04-04 DIAGNOSIS — H5441 Blindness, right eye, normal vision left eye: Secondary | ICD-10-CM

## 2015-04-04 LAB — LIPID PANEL
CHOL/HDL RATIO: 3.7 ratio (ref <=5.0)
Cholesterol: 191 mg/dL (ref 125–200)
HDL: 52 mg/dL (ref >=40)
LDL CALC: 112 mg/dL (ref <130)
Triglycerides: 133 mg/dL (ref <150)
VLDL: 27 mg/dL (ref <30)

## 2015-04-04 NOTE — Patient Instructions (Signed)
Medication Instructions:  Your physician recommends that you continue on your current medications as directed. Please refer to the Current Medication list given to you today.   Labwork: Lipid Today  Testing/Procedures: None ordered  Follow-Up: You have been referred to Lip[facility] Clinic for PSK-9 consideration  Your physician wants you to follow-up in: 1 year with Dr.Smith You will receive a reminder letter in the mail two months in advance. If you don't receive a letter, please call our office to schedule the follow-up appointment.    Any Other Special Instructions Will Be Listed Below (If Applicable).

## 2015-04-04 NOTE — Progress Notes (Signed)
Cardiology Office Note   Date:  04/04/2015   ID:  Keith Cunningham, DOB [birth year], MRN [id number]  PCP:  Donnie Coffin, MD  Cardiologist:  Sinclair Grooms, MD   Chief Complaint  Patient presents with  . Coronary Artery Disease      History of Present Illness: Keith Cunningham is a 74 y.o. male who presents for coronary artery disease with prior sequential SVG to OM1 and OM 2, LIMA to LAD, and SVG to PDA. He has statin intolerance. 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  Overall he is doing well. He has difficulty with balance because of vertigo and vision disturbance. He denies cardiopulmonary symptoms. He is still driving. He has been to multiple ENT.  Denies orthopnea, PND, dyspnea, and syncope.    Past Medical History  Diagnosis Date  . Pneumonia     "as a child" (05/06/2012)  . Myocardial infarction ([facility]) 04/21/2012  . Anginal pain ([facility]) 04/21/2012  . Coronary artery disease   . Shortness of breath     "due to this heart attack" (05/06/2012)  . Anemia   . History of blood transfusion 2012    "related to bleeding hemorrhoids" (05/06/2012)  . Bleeding internal hemorrhoids 2012  . H/O hiatal hernia   . 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 Hyperlipidemia     Past Surgical History  Procedure Laterality Date  . Tonsillectomy and adenoidectomy  ~ 1950  . Excisional hemorrhoidectomy  2012    "tried to shrink 1st w/needle; cut them out 2 wk later" (05/06/2012)  . Melanoma excision  2009    "left side of head" (05/06/2012)  . Cardiac catheterization  04/21/2012  . Coronary artery bypass graft  05/10/2012    Procedure: CORONARY ARTERY BYPASS GRAFTING (CABG);  Surgeon: Grace Isaac, MD;  Location: [facility];  Service: Open Heart Surgery;  Laterality: N/A;  times three using Left Greater Saphenous Vein Graft harvested endoscopically and Left Internal Mammary Artery  .  Intraoperative transesophageal echocardiogram  05/10/2012    Procedure: INTRAOPERATIVE TRANSESOPHAGEAL ECHOCARDIOGRAM;  Surgeon: Grace Isaac, MD;  Location: [facility];  Service: Open Heart Surgery;  Laterality: N/A;     Current Outpatient Prescriptions  Medication Sig Dispense Refill  . acetaminophen (TYLENOL) 500 MG tablet Take 1,000 mg by mouth every 6 (six) hours as needed (for headache).    Marland Kitchen aspirin 81 MG tablet Take 81 mg by mouth daily.    Marland Kitchen co-enzyme Q-10 50 MG capsule Take 200 mg by mouth daily.     . fish oil-omega-3 fatty acids 1000 MG capsule Take 3 capsules by mouth daily.      No current facility-administered medications for this visit.    Allergies:   Atorvastatin; Codeine; and Statins    Social History:  The patient  reports that he has never smoked. He has never used smokeless tobacco. He reports that he drinks alcohol. He reports that he does not use illicit drugs.   Family History:  The patient's family history includes Diabetes Mellitus II in his mother.    ROS:  Please see the history of present illness.   Otherwise, review of systems are positive for constipation, myalgias on statin therapy..   All other systems are reviewed and negative.    PHYSICAL EXAM: VS:  BP 122/74 mmHg  Pulse 74  Ht 5\' 6"  (1.676 m)  Wt 71.85 kg (158 lb 6.4 oz)  BMI 25.58 kg/m2 , BMI Body mass index is 25.58 kg/(m^2). GEN: Well nourished, well developed, in no acute distress HEENT: normal Neck: no JVD, carotid bruits, or masses Cardiac: RRR.  There is no murmur, rub, or gallop. There is no edema. Respiratory:  clear to auscultation bilaterally, normal work of breathing. GI: soft, nontender, nondistended, + BS MS: no deformity or atrophy Skin: warm and dry, no rash Neuro:  Strength and sensation are intact Psych: euthymic mood, full affect   EKG:  EKG is ordered today. The ekg reveals normal sinus rhythm, left axis deviation, and low voltage.   Recent Labs: No results  found for requested labs within last 365 days.    Lipid Panel    Component Value Date/Time   CHOL 266* 03/23/2014 0915   TRIG 100.0 03/23/2014 0915   HDL 57.70 03/23/2014 0915   CHOLHDL 5 03/23/2014 0915   VLDL 20.0 03/23/2014 0915   LDLCALC 188* 03/23/2014 0915      Wt Readings from Last 3 Encounters:  04/04/15 71.85 kg (158 lb 6.4 oz)  03/23/14 70.943 kg (156 lb 6.4 oz)  09/07/12 65.772 kg (145 lb)      Other studies Reviewed: Additional studies/ records that were reviewed today include: Prior lipid levels.. The findings include last few the LDL was 188.    ASSESSMENT AND PLAN:  1. S/P CABG (coronary artery bypass graft) Asymptomatic  2. Hyperlipidemia Last LDL 188, one year ago. Unable tolerate statin therapy. Does not want to be on therapy.  3. Vertigo  4. 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  The patient has the following concerns regarding medicines: None.  The following changes/actions have been instituted:    Lipid panel  Referral for consideration of PCSK- 9  - He refuses referral.  Aerobic activity  Encouraged consideration of therapy for lipids  Labs/ tests ordered today include:   Orders Placed This Encounter  Procedures  . Lipid panel  . EKG 12-Lead     Disposition:   FU with HS in 1 year  Signed, Sinclair Grooms, MD  04/04/2015 8:57 AM    [facility] [place], [place], [place]  [address] Phone: [phone]; Fax: [phone]

## 2016-03-31 ENCOUNTER — Other Ambulatory Visit: Payer: Self-pay | Admitting: Physician Assistant

## 2016-03-31 ENCOUNTER — Ambulatory Visit
Admission: RE | Admit: 2016-03-31 | Discharge: 2016-03-31 | Disposition: A | Discharge status: Home / Self Care | Payer: Medicare Other | Source: Ambulatory Visit | Attending: Physician Assistant | Admitting: Physician Assistant

## 2016-03-31 DIAGNOSIS — M25552 Pain in left hip: Secondary | ICD-10-CM

## 2016-04-03 ENCOUNTER — Encounter: Payer: Self-pay | Admitting: Interventional Cardiology

## 2016-04-03 ENCOUNTER — Ambulatory Visit (INDEPENDENT_AMBULATORY_CARE_PROVIDER_SITE_OTHER): Payer: Medicare Other | Admitting: Interventional Cardiology

## 2016-04-03 VITALS — BP 126/64 | HR 70 | Ht 66.0 in | Wt 157.0 lb

## 2016-04-03 DIAGNOSIS — E784 Other hyperlipidemia: Secondary | ICD-10-CM | POA: Diagnosis not present

## 2016-04-03 DIAGNOSIS — E7849 Other hyperlipidemia: Secondary | ICD-10-CM

## 2016-04-03 DIAGNOSIS — Z951 Presence of aortocoronary bypass graft: Secondary | ICD-10-CM | POA: Diagnosis not present

## 2016-04-03 NOTE — Progress Notes (Signed)
Cardiology Office Note    Date:  04/03/2016   ID:  Keith Cunningham, DOB 12/21/1940, MRN [id number]  PCP:  Donnie Coffin, MD  Cardiologist: Sinclair Grooms, MD   Chief Complaint  Patient presents with  . Coronary Artery Disease    History of Present Illness:  Keith Cunningham is a 75 y.o. male who presents for coronary artery disease with prior sequential SVG to OM1 and OM 2, LIMA to LAD, and SVG to PDA.  He is doing well. We again discussed therapy for hyperlipidemia. He is opposed. Does not want to discuss PCSK-9 therapy.  He denies claudication.   Past Medical History:  Diagnosis Date  . Anemia   . Anginal pain ([facility]) 04/21/2012  . Bleeding internal hemorrhoids 2012  . Coronary artery disease   . H/O hiatal hernia   . History of blood transfusion 2012   "related to bleeding hemorrhoids" (05/06/2012)  . Hyperlipidemia   . Melanoma of face ([facility]) 2009   left temple  . Myocardial infarction 04/21/2012  . Pneumonia    "as a child" (05/06/2012)  . Shortness of breath    "due to this heart attack" (05/06/2012)    Past Surgical History:  Procedure Laterality Date  . CARDIAC CATHETERIZATION  04/21/2012  . CORONARY ARTERY BYPASS GRAFT  05/10/2012   Procedure: CORONARY ARTERY BYPASS GRAFTING (CABG);  Surgeon: Grace Isaac, MD;  Location: [facility];  Service: Open Heart Surgery;  Laterality: N/A;  times three using Left Greater Saphenous Vein Graft harvested endoscopically and Left Internal Mammary Artery  . EXCISIONAL HEMORRHOIDECTOMY  2012   "tried to shrink 1st w/needle; cut them out 2 wk later" (05/06/2012)  . INTRAOPERATIVE TRANSESOPHAGEAL ECHOCARDIOGRAM  05/10/2012   Procedure: INTRAOPERATIVE TRANSESOPHAGEAL ECHOCARDIOGRAM;  Surgeon: Grace Isaac, MD;  Location: [facility];  Service: Open Heart Surgery;  Laterality: N/A;  . MELANOMA EXCISION  2009   "left side of head" (05/06/2012)  . TONSILLECTOMY AND ADENOIDECTOMY  ~ 1950    Current Medications: Outpatient  Medications Prior to Visit  Medication Sig Dispense Refill  . acetaminophen (TYLENOL) 500 MG tablet Take 1,000 mg by mouth every 6 (six) hours as needed (for headache).    Marland Kitchen aspirin 81 MG tablet Take 81 mg by mouth daily.    Marland Kitchen co-enzyme Q-10 50 MG capsule Take 200 mg by mouth daily.     . fish oil-omega-3 fatty acids 1000 MG capsule Take 3 capsules by mouth daily.      No facility-administered medications prior to visit.      Allergies:   Atorvastatin; Codeine; and Statins   Social History   Social History  . Marital status: Married    Spouse name: N/A  . Number of children: N/A  . Years of education: N/A   Social History Main Topics  . Smoking status: Never Smoker  . Smokeless tobacco: Never Used  . Alcohol use Yes     Comment: 05/06/2012 "used to drink back years ago; nothing in last 25 yrs"  . Drug use: No  . Sexual activity: Yes   Other Topics Concern  . None   Social History Narrative  . None     Family History:  The patient's family history includes Diabetes Mellitus II in his mother.   ROS:   Please see the history of present illness.    Mild chest wall discomfort.  All other systems reviewed and are negative.   PHYSICAL EXAM:   VS:  BP 126/64  Pulse 70   Ht 5\' 6"  (1.676 m)   Wt 157 lb (71.2 kg)   BMI 25.34 kg/m    GEN: Well nourished, well developed, in no acute distress  HEENT: normal  Neck: no JVD, carotid bruits, or masses Cardiac: RRR; no murmurs, rubs, or gallops,no edema  Respiratory:  clear to auscultation bilaterally, normal work of breathing GI: soft, nontender, nondistended, + BS MS: no deformity or atrophy  Skin: warm and dry, no rash Neuro:  Alert and Oriented x 3, Strength and sensation are intact Psych: euthymic mood, full affect  Wt Readings from Last 3 Encounters:  04/03/16 157 lb (71.2 kg)  04/04/15 158 lb 6.4 oz (71.8 kg)  03/23/14 156 lb 6.4 oz (70.9 kg)      Studies/Labs Reviewed:   EKG:  EKG  Left anterior hemiblock.  Low voltage. No change compared to prior.  Recent Labs: No results found for requested labs within last 8760 hours.   Lipid Panel    Component Value Date/Time   CHOL 191 04/04/2015 0908   TRIG 133 04/04/2015 0908   HDL 52 04/04/2015 0908   CHOLHDL 3.7 04/04/2015 0908   VLDL 27 04/04/2015 0908   LDLCALC 112 04/04/2015 0908    Additional studies/ records that were reviewed today include:  No new data    ASSESSMENT:    1. S/P CABG (coronary artery bypass graft)   2. Other hyperlipidemia      PLAN:  In order of problems listed above:  1. Asymptomatic with reference to angina. Had coronary bypass grafting in 2013. No limitations. Low-fat diet. 2. Refuses lipid manage med other than omega-3 fatty acid therapy. Does not want to consider PCSK-9 therapy.    Medication Adjustments/Labs and Tests Ordered: Current medicines are reviewed at length with the patient today.  Concerns regarding medicines are outlined above.  Medication changes, Labs and Tests ordered today are listed in the Patient Instructions below. There are no Patient Instructions on file for this visit.   Signed, Sinclair Grooms, MD  04/03/2016 8:38 AM    [facility] [place], [place], [place]  [address] Phone: [phone]; Fax: [phone]

## 2016-04-03 NOTE — Patient Instructions (Signed)

## 2016-05-06 ENCOUNTER — Telehealth: Payer: Self-pay | Admitting: Interventional Cardiology

## 2016-05-06 NOTE — Telephone Encounter (Signed)
Request for surgical clearance:  1. What type of surgery is being performed? Left Anterior Total Hip Arthroplasty   2. When is this surgery scheduled? 05/26/16   3. Are there any medications that need to be held prior to surgery and how long?  ASA  4. Name of physician performing surgery? Dr. Berenice Primas   5. What is your office phone and fax number? Ph [phone]  Fax [phone]

## 2016-05-06 NOTE — Telephone Encounter (Signed)
The patient is cleared for the upcoming hip replacement surgery.

## 2016-05-07 NOTE — Telephone Encounter (Signed)
Clearance faxed

## 2016-05-12 ENCOUNTER — Telehealth: Payer: Self-pay | Admitting: Interventional Cardiology

## 2016-05-12 ENCOUNTER — Other Ambulatory Visit: Payer: Self-pay | Admitting: Orthopedic Surgery

## 2016-05-12 NOTE — Telephone Encounter (Signed)
Holding aspirin prior to surgery is acceptable.

## 2016-05-12 NOTE — Telephone Encounter (Signed)
Faxed to Dr. Berenice Primas office.

## 2016-05-12 NOTE — Telephone Encounter (Signed)
Will forward to Dr. Tamala Julian for advisement on holding ASA prior to hip replacement.

## 2016-05-12 NOTE — Telephone Encounter (Signed)
[facility] received surgical clearance but it made no reference to his ASA, they need to know if he is to d/c taking it or continue? Pls call [phone] or place in Epic

## 2016-05-20 ENCOUNTER — Ambulatory Visit (HOSPITAL_COMMUNITY)
Admission: RE | Admit: 2016-05-20 | Discharge: 2016-05-20 | Disposition: A | Discharge status: Home / Self Care | Payer: Medicare Other | Source: Ambulatory Visit | Attending: Orthopedic Surgery | Admitting: Orthopedic Surgery

## 2016-05-20 ENCOUNTER — Encounter (HOSPITAL_COMMUNITY): Payer: Self-pay

## 2016-05-20 ENCOUNTER — Encounter (HOSPITAL_COMMUNITY)
Admission: RE | Admit: 2016-05-20 | Discharge: 2016-05-20 | Disposition: A | Discharge status: Home / Self Care | Payer: Medicare Other | Source: Ambulatory Visit | Attending: Orthopedic Surgery | Admitting: Orthopedic Surgery

## 2016-05-20 DIAGNOSIS — Z01812 Encounter for preprocedural laboratory examination: Secondary | ICD-10-CM | POA: Insufficient documentation

## 2016-05-20 DIAGNOSIS — Z01818 Encounter for other preprocedural examination: Secondary | ICD-10-CM | POA: Diagnosis present

## 2016-05-20 DIAGNOSIS — M1612 Unilateral primary osteoarthritis, left hip: Secondary | ICD-10-CM | POA: Insufficient documentation

## 2016-05-20 HISTORY — DX: Unspecified osteoarthritis, unspecified site: M19.90

## 2016-05-20 LAB — COMPREHENSIVE METABOLIC PANEL
ALT: 23 U/L (ref 17–63)
ANION GAP: 7 (ref 5–15)
AST: 29 U/L (ref 15–41)
Albumin: 3.9 g/dL (ref 3.5–5.0)
Alkaline Phosphatase: 70 U/L (ref 38–126)
BILIRUBIN TOTAL: 0.7 mg/dL (ref 0.3–1.2)
BUN: 13 mg/dL (ref 6–20)
CHLORIDE: 108 mmol/L (ref 101–111)
CO2: 25 mmol/L (ref 22–32)
Calcium: 9.5 mg/dL (ref 8.9–10.3)
Creatinine, Ser: 0.96 mg/dL (ref 0.61–1.24)
Glucose, Bld: 109 mg/dL — ABNORMAL HIGH (ref 65–99)
POTASSIUM: 3.9 mmol/L (ref 3.5–5.1)
Sodium: 140 mmol/L (ref 135–145)
TOTAL PROTEIN: 7 g/dL (ref 6.5–8.1)

## 2016-05-20 LAB — APTT: APTT: 41 s — AB (ref 24–36)

## 2016-05-20 LAB — URINALYSIS, ROUTINE W REFLEX MICROSCOPIC
Bilirubin Urine: NEGATIVE
GLUCOSE, UA: NEGATIVE mg/dL
Hgb urine dipstick: NEGATIVE
KETONES UR: NEGATIVE mg/dL
Nitrite: NEGATIVE
PH: 5.5 (ref 5.0–8.0)
Protein, ur: NEGATIVE mg/dL
Specific Gravity, Urine: 1.015 (ref 1.005–1.030)

## 2016-05-20 LAB — CBC WITH DIFFERENTIAL/PLATELET
Basophils Absolute: 0 10*3/uL (ref 0.0–0.1)
Basophils Relative: 0 %
EOS PCT: 1 %
Eosinophils Absolute: 0.1 10*3/uL (ref 0.0–0.7)
HEMATOCRIT: 42.6 % (ref 39.0–52.0)
Hemoglobin: 13.8 g/dL (ref 13.0–17.0)
LYMPHS PCT: 29 %
Lymphs Abs: 2 10*3/uL (ref 0.7–4.0)
MCH: 27.8 pg (ref 26.0–34.0)
MCHC: 32.4 g/dL (ref 30.0–36.0)
MCV: 85.7 fL (ref 78.0–100.0)
MONO ABS: 0.6 10*3/uL (ref 0.1–1.0)
MONOS PCT: 9 %
NEUTROS ABS: 4.4 10*3/uL (ref 1.7–7.7)
Neutrophils Relative %: 61 %
PLATELETS: 249 10*3/uL (ref 150–400)
RBC: 4.97 MIL/uL (ref 4.22–5.81)
RDW: 14.6 % (ref 11.5–15.5)
WBC: 7.1 10*3/uL (ref 4.0–10.5)

## 2016-05-20 LAB — SURGICAL PCR SCREEN
MRSA, PCR: NEGATIVE
STAPHYLOCOCCUS AUREUS: POSITIVE — AB

## 2016-05-20 LAB — PROTIME-INR
INR: 1.05
PROTHROMBIN TIME: 13.7 s (ref 11.4–15.2)

## 2016-05-20 LAB — TYPE AND SCREEN
ABO/RH(D): O POS
Antibody Screen: NEGATIVE

## 2016-05-20 LAB — URINALYSIS, MICROSCOPIC (REFLEX): Squamous Epithelial / HPF: NONE SEEN

## 2016-05-20 NOTE — Pre-Procedure Instructions (Addendum)
Keith Cunningham  05/20/2016      [facility] [place], [place] - [address] [place] [address] Phone: [phone] Fax: [place], [place] Keith Cunningham [place] [address] Phone: [phone] Fax: [phone]    Your procedure is scheduled on *05/26/16  Report to [facility] Admitting at 1255 A.M.  Call this number if you have problems the morning of surgery:  [phone]   Remember:  Do not eat food or drink liquids after midnight.  Take these medicines the morning of surgery with A SIP OF WATER  NONE  STOP all herbel meds, nsaids (aleve,naproxen,advil,ibuprofen)   Today including aspirin, coq10,fish oil, multi vit   Do not wear jewelry, make-up or nail polish.  Do not wear lotions, powders, or perfumes, or deoderant.  Do not shave 48 hours prior to surgery.  Men may shave face and neck.  Do not bring valuables to the hospital.  [facility] is not responsible for any belongings or valuables.  Contacts, dentures or bridgework may not be worn into surgery.  Leave your suitcase in the car.  After surgery it may be brought to your room.  For patients admitted to the hospital, discharge time will be determined by your treatment team.  Patients discharged the day of surgery will not be allowed to drive home.   Special instructions:   Special Instructions: [facility] - Preparing for Surgery  Before surgery, you can play an important role.  Because skin is not sterile, your skin needs to be as free of germs as possible.  You can reduce the number of germs on you skin by washing with CHG (chlorahexidine gluconate) soap before surgery.  CHG is an antiseptic cleaner which kills germs and bonds with the skin to continue killing germs even after washing.  Please DO NOT use if you have an allergy to CHG or antibacterial soaps.  If your skin becomes  reddened/irritated stop using the CHG and inform your nurse when you arrive at Short Stay.  Do not shave (including legs and underarms) for at least 48 hours prior to the first CHG shower.  You may shave your face.  Please follow these instructions carefully:   1.  Shower with CHG Soap the night before surgery and the morning of Surgery.  2.  If you choose to wash your hair, wash your hair first as usual with your normal shampoo.  3.  After you shampoo, rinse your hair and body thoroughly to remove the Shampoo.  4.  Use CHG as you would any other liquid soap.  You can apply chg directly  to the skin and wash gently with scrungie or a clean washcloth.  5.  Apply the CHG Soap to your body ONLY FROM THE NECK DOWN.  Do not use on open wounds or open sores.  Avoid contact with your eyes ears, mouth and genitals (private parts).  Wash genitals (private parts)       with your normal soap.  6.  Wash thoroughly, paying special attention to the area where your surgery will be performed.  7.  Thoroughly rinse your body with warm water from the neck down.  8.  DO NOT shower/wash with your normal soap after using and rinsing off the CHG Soap.  9.  Pat yourself dry with a clean towel.  10.  Wear clean pajamas.            11.  Place clean sheets on your bed the night of your first shower and do not sleep with pets.  Day of Surgery  Do not apply any lotions/deodorants the morning of surgery.  Please wear clean clothes to the hospital/surgery center.  Please read over the  fact sheets that you were given.

## 2016-05-26 ENCOUNTER — Inpatient Hospital Stay (HOSPITAL_COMMUNITY): Payer: Medicare Other | Admitting: Certified Registered Nurse Anesthetist

## 2016-05-26 ENCOUNTER — Inpatient Hospital Stay (HOSPITAL_COMMUNITY)
Admission: RE | Admit: 2016-05-26 | Discharge: 2016-05-27 | DRG: 470 | Disposition: A | Discharge status: Home Health | Payer: Medicare Other | Source: Ambulatory Visit | Attending: Orthopedic Surgery | Admitting: Orthopedic Surgery

## 2016-05-26 ENCOUNTER — Inpatient Hospital Stay (HOSPITAL_COMMUNITY): Payer: Medicare Other

## 2016-05-26 ENCOUNTER — Encounter (HOSPITAL_COMMUNITY)
Admission: RE | Disposition: A | Discharge status: Home / Self Care | Payer: Self-pay | Source: Ambulatory Visit | Attending: Orthopedic Surgery

## 2016-05-26 ENCOUNTER — Encounter (HOSPITAL_COMMUNITY): Payer: Self-pay | Admitting: Certified Registered Nurse Anesthetist

## 2016-05-26 DIAGNOSIS — Z419 Encounter for procedure for purposes other than remedying health state, unspecified: Secondary | ICD-10-CM

## 2016-05-26 DIAGNOSIS — I252 Old myocardial infarction: Secondary | ICD-10-CM

## 2016-05-26 DIAGNOSIS — Z833 Family history of diabetes mellitus: Secondary | ICD-10-CM

## 2016-05-26 DIAGNOSIS — Z951 Presence of aortocoronary bypass graft: Secondary | ICD-10-CM

## 2016-05-26 DIAGNOSIS — I2510 Atherosclerotic heart disease of native coronary artery without angina pectoris: Secondary | ICD-10-CM | POA: Diagnosis present

## 2016-05-26 DIAGNOSIS — Z886 Allergy status to analgesic agent status: Secondary | ICD-10-CM

## 2016-05-26 DIAGNOSIS — Z9889 Other specified postprocedural states: Secondary | ICD-10-CM | POA: Diagnosis not present

## 2016-05-26 DIAGNOSIS — M1612 Unilateral primary osteoarthritis, left hip: Principal | ICD-10-CM | POA: Diagnosis present

## 2016-05-26 DIAGNOSIS — Z85820 Personal history of malignant melanoma of skin: Secondary | ICD-10-CM

## 2016-05-26 DIAGNOSIS — H5461 Unqualified visual loss, right eye, normal vision left eye: Secondary | ICD-10-CM | POA: Diagnosis present

## 2016-05-26 DIAGNOSIS — Z888 Allergy status to other drugs, medicaments and biological substances status: Secondary | ICD-10-CM | POA: Diagnosis not present

## 2016-05-26 DIAGNOSIS — M1990 Unspecified osteoarthritis, unspecified site: Secondary | ICD-10-CM | POA: Diagnosis present

## 2016-05-26 DIAGNOSIS — R2689 Other abnormalities of gait and mobility: Secondary | ICD-10-CM

## 2016-05-26 HISTORY — PX: TOTAL HIP ARTHROPLASTY: SHX124

## 2016-05-26 SURGERY — ARTHROPLASTY, HIP, TOTAL, ANTERIOR APPROACH
Anesthesia: Spinal | Site: Hip | Laterality: Left

## 2016-05-26 MED ORDER — HYDROCODONE-ACETAMINOPHEN 10-325 MG PO TABS: 1.0000 | ORAL_TABLET | Freq: Four times a day (QID) | ORAL | 0 refills | Status: DC | PRN

## 2016-05-26 MED ORDER — TRANEXAMIC ACID 1000 MG/10ML IV SOLN
1000.0000 mg | INTRAVENOUS | Status: AC
  Administered 2016-05-26: 1000 mg via INTRAVENOUS
  Filled 2016-05-26: qty 10

## 2016-05-26 MED ORDER — KETOROLAC TROMETHAMINE 15 MG/ML IJ SOLN
15.0000 mg | Freq: Three times a day (TID) | INTRAMUSCULAR | Status: DC
  Administered 2016-05-26 – 2016-05-27 (×2): 15 mg via INTRAVENOUS
  Filled 2016-05-26 (×2): qty 1

## 2016-05-26 MED ORDER — ACETAMINOPHEN 325 MG PO TABS: 650.0000 mg | ORAL_TABLET | Freq: Four times a day (QID) | ORAL | Status: DC | PRN

## 2016-05-26 MED ORDER — MIDAZOLAM HCL 5 MG/5ML IJ SOLN
INTRAMUSCULAR | Status: DC | PRN
  Administered 2016-05-26: 1 mg via INTRAVENOUS

## 2016-05-26 MED ORDER — ONDANSETRON HCL 4 MG/2ML IJ SOLN
INTRAMUSCULAR | Status: DC | PRN
  Administered 2016-05-26: 4 mg via INTRAVENOUS

## 2016-05-26 MED ORDER — DEXAMETHASONE SODIUM PHOSPHATE 10 MG/ML IJ SOLN
INTRAMUSCULAR | Status: AC
  Filled 2016-05-26: qty 1

## 2016-05-26 MED ORDER — ONDANSETRON HCL 4 MG PO TABS: 4.0000 mg | ORAL_TABLET | Freq: Four times a day (QID) | ORAL | Status: DC | PRN

## 2016-05-26 MED ORDER — DOCUSATE SODIUM 100 MG PO CAPS: 100.0000 mg | ORAL_CAPSULE | Freq: Two times a day (BID) | ORAL | 0 refills | Status: DC

## 2016-05-26 MED ORDER — ONDANSETRON HCL 4 MG/2ML IJ SOLN: 4.0000 mg | Freq: Four times a day (QID) | INTRAMUSCULAR | Status: DC | PRN

## 2016-05-26 MED ORDER — METHOCARBAMOL 1000 MG/10ML IJ SOLN
500.0000 mg | Freq: Four times a day (QID) | INTRAVENOUS | Status: DC | PRN
  Filled 2016-05-26: qty 5

## 2016-05-26 MED ORDER — ASPIRIN EC 325 MG PO TBEC: 325.0000 mg | DELAYED_RELEASE_TABLET | Freq: Two times a day (BID) | ORAL | 0 refills | Status: DC

## 2016-05-26 MED ORDER — BUPIVACAINE HCL 0.5 % IJ SOLN
INTRAMUSCULAR | Status: DC | PRN
  Administered 2016-05-26: 20 mL

## 2016-05-26 MED ORDER — BUPIVACAINE IN DEXTROSE 0.75-8.25 % IT SOLN
INTRATHECAL | Status: DC | PRN
  Administered 2016-05-26: 2 mL via INTRATHECAL

## 2016-05-26 MED ORDER — CHLORHEXIDINE GLUCONATE 4 % EX LIQD: 60.0000 mL | Freq: Once | CUTANEOUS | Status: DC

## 2016-05-26 MED ORDER — 0.9 % SODIUM CHLORIDE (POUR BTL) OPTIME
TOPICAL | Status: DC | PRN
  Administered 2016-05-26: 1000 mL

## 2016-05-26 MED ORDER — TRANEXAMIC ACID 1000 MG/10ML IV SOLN
1000.0000 mg | Freq: Once | INTRAVENOUS | Status: AC
  Administered 2016-05-26: 1000 mg via INTRAVENOUS
  Filled 2016-05-26: qty 10

## 2016-05-26 MED ORDER — PHENYLEPHRINE HCL 10 MG/ML IJ SOLN
INTRAMUSCULAR | Status: DC | PRN
  Administered 2016-05-26 (×2): 40 ug via INTRAVENOUS

## 2016-05-26 MED ORDER — HYDROCODONE-ACETAMINOPHEN 10-325 MG PO TABS: 1.0000 | ORAL_TABLET | ORAL | Status: DC | PRN

## 2016-05-26 MED ORDER — OXYCODONE-ACETAMINOPHEN 5-325 MG PO TABS: 1.0000 | ORAL_TABLET | ORAL | 0 refills | Status: DC | PRN

## 2016-05-26 MED ORDER — CELECOXIB 200 MG PO CAPS
200.0000 mg | ORAL_CAPSULE | Freq: Two times a day (BID) | ORAL | Status: DC
  Administered 2016-05-26 – 2016-05-27 (×2): 200 mg via ORAL
  Filled 2016-05-26 (×2): qty 1

## 2016-05-26 MED ORDER — LIDOCAINE 2% (20 MG/ML) 5 ML SYRINGE
INTRAMUSCULAR | Status: AC
  Filled 2016-05-26: qty 5

## 2016-05-26 MED ORDER — TIZANIDINE HCL 2 MG PO TABS: 2.0000 mg | ORAL_TABLET | Freq: Three times a day (TID) | ORAL | 0 refills | Status: DC | PRN

## 2016-05-26 MED ORDER — SODIUM CHLORIDE 0.9 % IV SOLN
INTRAVENOUS | Status: DC
  Administered 2016-05-26: 19:00:00 via INTRAVENOUS
  Administered 2016-05-27: 100 mL/h via INTRAVENOUS

## 2016-05-26 MED ORDER — ZOLPIDEM TARTRATE 5 MG PO TABS: 5.0000 mg | ORAL_TABLET | Freq: Every evening | ORAL | Status: DC | PRN

## 2016-05-26 MED ORDER — ALUM & MAG HYDROXIDE-SIMETH 200-200-20 MG/5ML PO SUSP: 30.0000 mL | ORAL | Status: DC | PRN

## 2016-05-26 MED ORDER — PROPOFOL 10 MG/ML IV BOLUS
INTRAVENOUS | Status: DC | PRN
  Administered 2016-05-26 (×2): 10 mg via INTRAVENOUS

## 2016-05-26 MED ORDER — LACTATED RINGERS IV SOLN
INTRAVENOUS | Status: DC
  Administered 2016-05-26 (×2): via INTRAVENOUS

## 2016-05-26 MED ORDER — PHENYLEPHRINE HCL 10 MG/ML IJ SOLN
INTRAVENOUS | Status: DC | PRN
  Administered 2016-05-26: 20 ug/min via INTRAVENOUS

## 2016-05-26 MED ORDER — BISACODYL 5 MG PO TBEC: 5.0000 mg | DELAYED_RELEASE_TABLET | Freq: Every day | ORAL | Status: DC | PRN

## 2016-05-26 MED ORDER — GABAPENTIN 300 MG PO CAPS
300.0000 mg | ORAL_CAPSULE | Freq: Two times a day (BID) | ORAL | Status: DC
  Administered 2016-05-26 – 2016-05-27 (×2): 300 mg via ORAL
  Filled 2016-05-26 (×2): qty 1

## 2016-05-26 MED ORDER — BUPIVACAINE HCL (PF) 0.5 % IJ SOLN
INTRAMUSCULAR | Status: AC
  Filled 2016-05-26: qty 30

## 2016-05-26 MED ORDER — PROMETHAZINE HCL 25 MG/ML IJ SOLN: 6.2500 mg | Freq: Four times a day (QID) | INTRAMUSCULAR | Status: DC | PRN

## 2016-05-26 MED ORDER — DEXAMETHASONE SODIUM PHOSPHATE 10 MG/ML IJ SOLN
INTRAMUSCULAR | Status: DC | PRN
  Administered 2016-05-26: 10 mg via INTRAVENOUS

## 2016-05-26 MED ORDER — FENTANYL CITRATE (PF) 100 MCG/2ML IJ SOLN
INTRAMUSCULAR | Status: AC
  Filled 2016-05-26: qty 2

## 2016-05-26 MED ORDER — BUPIVACAINE LIPOSOME 1.3 % IJ SUSP
20.0000 mL | INTRAMUSCULAR | Status: AC
  Administered 2016-05-26: 20 mL
  Filled 2016-05-26: qty 20

## 2016-05-26 MED ORDER — MAGNESIUM CITRATE PO SOLN: 1.0000 | Freq: Once | ORAL | Status: DC | PRN

## 2016-05-26 MED ORDER — ACETAMINOPHEN 650 MG RE SUPP: 650.0000 mg | Freq: Four times a day (QID) | RECTAL | Status: DC | PRN

## 2016-05-26 MED ORDER — DOCUSATE SODIUM 100 MG PO CAPS
100.0000 mg | ORAL_CAPSULE | Freq: Two times a day (BID) | ORAL | Status: DC
  Administered 2016-05-26 – 2016-05-27 (×2): 100 mg via ORAL
  Filled 2016-05-26 (×2): qty 1

## 2016-05-26 MED ORDER — PHENYLEPHRINE 40 MCG/ML (10ML) SYRINGE FOR IV PUSH (FOR BLOOD PRESSURE SUPPORT)
PREFILLED_SYRINGE | INTRAVENOUS | Status: AC
  Filled 2016-05-26: qty 10

## 2016-05-26 MED ORDER — METHOCARBAMOL 500 MG PO TABS: 500.0000 mg | ORAL_TABLET | Freq: Four times a day (QID) | ORAL | Status: DC | PRN

## 2016-05-26 MED ORDER — ASPIRIN EC 325 MG PO TBEC
325.0000 mg | DELAYED_RELEASE_TABLET | Freq: Two times a day (BID) | ORAL | Status: DC
  Administered 2016-05-26 – 2016-05-27 (×2): 325 mg via ORAL
  Filled 2016-05-26 (×2): qty 1

## 2016-05-26 MED ORDER — PROPOFOL 500 MG/50ML IV EMUL
INTRAVENOUS | Status: DC | PRN
  Administered 2016-05-26: 75 ug/kg/min via INTRAVENOUS

## 2016-05-26 MED ORDER — CEFAZOLIN SODIUM-DEXTROSE 2-4 GM/100ML-% IV SOLN
2.0000 g | INTRAVENOUS | Status: AC
  Administered 2016-05-26: 2 g via INTRAVENOUS
  Filled 2016-05-26: qty 100

## 2016-05-26 MED ORDER — POLYETHYLENE GLYCOL 3350 17 G PO PACK
17.0000 g | PACK | Freq: Every day | ORAL | Status: DC | PRN
Start: 2016-05-26 — End: 2016-05-27

## 2016-05-26 MED ORDER — CEFAZOLIN SODIUM-DEXTROSE 2-4 GM/100ML-% IV SOLN
2.0000 g | Freq: Four times a day (QID) | INTRAVENOUS | Status: AC
  Administered 2016-05-26 – 2016-05-27 (×2): 2 g via INTRAVENOUS
  Filled 2016-05-26 (×2): qty 100

## 2016-05-26 MED ORDER — LIDOCAINE 2% (20 MG/ML) 5 ML SYRINGE
INTRAMUSCULAR | Status: DC | PRN
  Administered 2016-05-26: 50 mg via INTRAVENOUS

## 2016-05-26 MED ORDER — DIPHENHYDRAMINE HCL 12.5 MG/5ML PO ELIX: 12.5000 mg | ORAL_SOLUTION | ORAL | Status: DC | PRN

## 2016-05-26 MED ORDER — MIDAZOLAM HCL 2 MG/2ML IJ SOLN
INTRAMUSCULAR | Status: AC
  Filled 2016-05-26: qty 2

## 2016-05-26 MED ORDER — FENTANYL CITRATE (PF) 100 MCG/2ML IJ SOLN
INTRAMUSCULAR | Status: DC | PRN
  Administered 2016-05-26: 50 ug via INTRAVENOUS

## 2016-05-26 MED ORDER — HYDROMORPHONE HCL 2 MG/ML IJ SOLN: 0.5000 mg | INTRAMUSCULAR | Status: DC | PRN

## 2016-05-26 MED ORDER — DEXAMETHASONE SODIUM PHOSPHATE 10 MG/ML IJ SOLN
10.0000 mg | Freq: Two times a day (BID) | INTRAMUSCULAR | Status: DC
  Administered 2016-05-26 – 2016-05-27 (×2): 10 mg via INTRAVENOUS
  Filled 2016-05-26 (×2): qty 1

## 2016-05-26 SURGICAL SUPPLY — 58 items
BENZOIN TINCTURE PRP APPL 2/3 (GAUZE/BANDAGES/DRESSINGS) ×3 IMPLANT
BLADE SAW SGTL 18X1.27X75 (BLADE) ×2 IMPLANT
BLADE SAW SGTL 18X1.27X75MM (BLADE) ×1
BLADE SURG ROTATE 9660 (MISCELLANEOUS) IMPLANT
BNDG COHESIVE 6X5 TAN STRL LF (GAUZE/BANDAGES/DRESSINGS) IMPLANT
BNDG GAUZE ELAST 4 BULKY (GAUZE/BANDAGES/DRESSINGS) IMPLANT
CAPT HIP TOTAL 2 ×3 IMPLANT
CELLS DAT CNTRL 66122 CELL SVR (MISCELLANEOUS) IMPLANT
CLOSURE STERI-STRIP 1/2X4 (GAUZE/BANDAGES/DRESSINGS)
CLSR STERI-STRIP ANTIMIC 1/2X4 (GAUZE/BANDAGES/DRESSINGS) IMPLANT
COVER PERINEAL POST (MISCELLANEOUS) ×3 IMPLANT
COVER SURGICAL LIGHT HANDLE (MISCELLANEOUS) ×3 IMPLANT
DRAPE C-ARM 42X72 X-RAY (DRAPES) ×3 IMPLANT
DRAPE STERI IOBAN 125X83 (DRAPES) ×3 IMPLANT
DRAPE U-SHAPE 47X51 STRL (DRAPES) ×6 IMPLANT
DRSG AQUACEL AG ADV 3.5X10 (GAUZE/BANDAGES/DRESSINGS) ×3 IMPLANT
DURAPREP 26ML APPLICATOR (WOUND CARE) ×3 IMPLANT
ELECT BLADE 4.0 EZ CLEAN MEGAD (MISCELLANEOUS)
ELECT CAUTERY BLADE 6.4 (BLADE) ×3 IMPLANT
ELECT REM PT RETURN 9FT ADLT (ELECTROSURGICAL) ×3
ELECTRODE BLDE 4.0 EZ CLN MEGD (MISCELLANEOUS) IMPLANT
ELECTRODE REM PT RTRN 9FT ADLT (ELECTROSURGICAL) ×1 IMPLANT
GAUZE XEROFORM 1X8 LF (GAUZE/BANDAGES/DRESSINGS) ×3 IMPLANT
GLOVE BIOGEL PI IND STRL 8 (GLOVE) ×2 IMPLANT
GLOVE BIOGEL PI INDICATOR 8 (GLOVE) ×4
GLOVE ECLIPSE 7.5 STRL STRAW (GLOVE) ×6 IMPLANT
GOWN STRL REUS W/ TWL LRG LVL3 (GOWN DISPOSABLE) ×2 IMPLANT
GOWN STRL REUS W/ TWL XL LVL3 (GOWN DISPOSABLE) ×2 IMPLANT
GOWN STRL REUS W/TWL LRG LVL3 (GOWN DISPOSABLE) ×4
GOWN STRL REUS W/TWL XL LVL3 (GOWN DISPOSABLE) ×4
HOOD PEEL AWAY FACE SHEILD DIS (HOOD) ×6 IMPLANT
KIT BASIN OR (CUSTOM PROCEDURE TRAY) ×3 IMPLANT
KIT ROOM TURNOVER OR (KITS) ×3 IMPLANT
MANIFOLD NEPTUNE II (INSTRUMENTS) ×3 IMPLANT
NEEDLE SPNL 22GX3.5 QUINCKE BK (NEEDLE) ×6 IMPLANT
NS IRRIG 1000ML POUR BTL (IV SOLUTION) ×3 IMPLANT
PACK TOTAL JOINT (CUSTOM PROCEDURE TRAY) ×3 IMPLANT
PACK UNIVERSAL I (CUSTOM PROCEDURE TRAY) ×3 IMPLANT
PAD ARMBOARD 7.5X6 YLW CONV (MISCELLANEOUS) ×6 IMPLANT
RTRCTR WOUND ALEXIS 18CM MED (MISCELLANEOUS)
RTRCTR WOUND ALEXIS 18CM SML (INSTRUMENTS) ×6
SAVER CELL AAL HAEMONETICS (INSTRUMENTS) ×2 IMPLANT
SPONGE LAP 18X18 X RAY DECT (DISPOSABLE) IMPLANT
STAPLER VISISTAT 35W (STAPLE) IMPLANT
SUT ETHIBOND NAB CT1 #1 30IN (SUTURE) ×6 IMPLANT
SUT MNCRL AB 3-0 PS2 18 (SUTURE) IMPLANT
SUT VIC AB 0 CT1 27 (SUTURE) ×2
SUT VIC AB 0 CT1 27XBRD ANBCTR (SUTURE) ×1 IMPLANT
SUT VIC AB 1 CT1 27 (SUTURE) ×4
SUT VIC AB 1 CT1 27XBRD ANBCTR (SUTURE) ×2 IMPLANT
SUT VIC AB 2-0 CT1 27 (SUTURE) ×2
SUT VIC AB 2-0 CT1 TAPERPNT 27 (SUTURE) ×1 IMPLANT
SYR 50ML LL SCALE MARK (SYRINGE) ×6 IMPLANT
TOWEL OR 17X24 6PK STRL BLUE (TOWEL DISPOSABLE) ×3 IMPLANT
TOWEL OR 17X26 10 PK STRL BLUE (TOWEL DISPOSABLE) ×3 IMPLANT
TRAY CATH 16FR W/PLASTIC CATH (SET/KITS/TRAYS/PACK) IMPLANT
TRAY FOLEY CATH 16FR SILVER (SET/KITS/TRAYS/PACK) IMPLANT
WATER STERILE IRR 1000ML POUR (IV SOLUTION) ×6 IMPLANT

## 2016-05-26 NOTE — Anesthesia Procedure Notes (Addendum)
Spinal  Patient location during procedure: OR Start time: 05/26/2016 1:50 PM End time: 05/26/2016 1:53 PM Staffing Anesthesiologist: Suella Broad D Performed: anesthesiologist  Preanesthetic Checklist Completed: patient identified, site marked, surgical consent, pre-op evaluation, timeout performed, IV checked, risks and benefits discussed and monitors and equipment checked Spinal Block Patient position: sitting Prep: Betadine Patient monitoring: heart rate, continuous pulse ox, blood pressure and cardiac monitor Approach: midline Location: L4-5 Injection technique: single-shot Needle Needle type: Introducer and Pencan  Needle gauge: 24 G Needle length: 9 cm Additional Notes Negative paresthesia. Negative blood return. Positive free-flowing CSF. Expiration date of kit checked and confirmed. Patient tolerated procedure well, without complications.

## 2016-05-26 NOTE — Anesthesia Postprocedure Evaluation (Addendum)
Anesthesia Post Note  Patient: Keith Cunningham  Procedure(s) Performed: Procedure(s) (LRB): TOTAL HIP ARTHROPLASTY ANTERIOR APPROACH (Left)  Patient location during evaluation: PACU Anesthesia Type: Spinal Level of consciousness: awake and alert Pain management: pain level controlled Vital Signs Assessment: post-procedure vital signs reviewed and stable Respiratory status: spontaneous breathing, nonlabored ventilation, respiratory function stable and patient connected to nasal cannula oxygen Cardiovascular status: blood pressure returned to baseline and stable Postop Assessment: no signs of nausea or vomiting and spinal receding Anesthetic complications: no    Last Vitals:  Vitals:   05/26/16 1745 05/26/16 1755  BP: 132/81   Pulse: 65 64  Resp: (!) 21 15  Temp:  36.4 C    Last Pain:  Vitals:   05/26/16 1755  TempSrc:   PainSc: 0-No pain                 Effie Berkshire

## 2016-05-26 NOTE — Anesthesia Procedure Notes (Signed)
Procedure Name: MAC Date/Time: 05/26/2016 1:50 PM Performed by: Candis Shine Pre-anesthesia Checklist: Patient identified, Emergency Drugs available, Suction available, Patient being monitored and Timeout performed Patient Re-evaluated:Patient Re-evaluated prior to inductionDental Injury: Teeth and Oropharynx as per pre-operative assessment

## 2016-05-26 NOTE — H&P (Signed)
TOTAL HIP ADMISSION H&P  Patient is admitted for left total hip arthroplasty.  Subjective:  Chief Complaint: left hip pain  HPI: Keith Cunningham, 74 y.o. male, has a history of pain and functional disability in the left hip(s) due to arthritis and patient has failed non-surgical conservative treatments for greater than 12 weeks to include NSAID's and/or analgesics, corticosteriod injections and activity modification.  Onset of symptoms was gradual starting 3 years ago with gradually worsening course since that time.The patient noted no past surgery on the left hip(s).  Patient currently rates pain in the left hip at 10 out of 10 with activity. Patient has night pain, worsening of pain with activity and weight bearing, trendelenberg gait, pain that interfers with activities of daily living, pain with passive range of motion and joint swelling. Patient has evidence of subchondral cysts, subchondral sclerosis, periarticular osteophytes and joint space narrowing by imaging studies. This condition presents safety issues increasing the risk of falls. This patient has had failure of all reasonable conservative care.  The patient has undergone injection therapy into the hip which gave him excellent relief..  There is no current active infection.  Patient Active Problem List   Diagnosis Date Noted  . Blindness of right eye 04/04/2015  . 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 Vertigo 03/23/2014  . Hyperlipidemia 03/23/2014  . 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 Abnormal stress test 05/06/2012  . Coronary atherosclerosis of native coronary artery 05/06/2012    Class: Acute  . Chest pain 04/22/2012  . Iron deficiency anemia 04/22/2012   Past Medical History:  Diagnosis Date  . Anemia   . Anginal pain ([place]) 04/21/2012  . Arthritis   . Bleeding internal hemorrhoids 2012  . Coronary artery disease   . H/O hiatal hernia   . History of blood transfusion 2012    "related to bleeding hemorrhoids" (05/06/2012)  . Hyperlipidemia   . Melanoma of face ([place]) 2009   left temple  . Myocardial infarction 04/21/2012  . Pneumonia    "as a child" (05/06/2012)  . 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     Past Surgical History:  Procedure Laterality Date  . CARDIAC CATHETERIZATION  04/21/2012  . CORONARY ARTERY BYPASS GRAFT  05/10/2012   Procedure: CORONARY ARTERY BYPASS GRAFTING (CABG);  Surgeon: Grace Isaac, MD;  Location: [place];  Service: Open Heart Surgery;  Laterality: N/A;  times three using Left Greater Saphenous Vein Graft harvested endoscopically and Left Internal Mammary Artery  . EXCISIONAL HEMORRHOIDECTOMY  2012   "tried to shrink 1st w/needle; cut them out 2 wk later" (05/06/2012)  . EYE SURGERY Bilateral    cataracts  . INTRAOPERATIVE TRANSESOPHAGEAL ECHOCARDIOGRAM  05/10/2012   Procedure: INTRAOPERATIVE TRANSESOPHAGEAL ECHOCARDIOGRAM;  Surgeon: Grace Isaac, MD;  Location: [place];  Service: Open Heart Surgery;  Laterality: N/A;  . MELANOMA EXCISION  2009   "left side of head" (05/06/2012)  . TONSILLECTOMY AND ADENOIDECTOMY  ~ 1950    No prescriptions prior to admission.   Allergies  Allergen Reactions  . Atorvastatin Nausea And Vomiting and Other (See Comments)    CONSTIPATION FATIGUE  . Statins Other (See Comments)    Myalgias   . Codeine Nausea And Vomiting    Social History  Substance Use Topics  . Smoking status: Never Smoker  . Smokeless tobacco:  Never Used  . Alcohol use Yes     Comment: 05/06/2012 "used to drink back years ago; nothing in last 25 yrs"    Family History  Problem Relation Age of Onset  . Diabetes Mellitus II Mother      ROS ROS: I have reviewed the patient's review of systems thoroughly and there are no positive responses as relates to the HPI. Objective:  Physical Exam  Vital  signs in last 24 hours:   Well-developed well-nourished patient in no acute distress. Alert and oriented x3 HEENT:within normal limits Cardiac: Regular rate and rhythm Pulmonary: Lungs clear to auscultation Abdomen: Soft and nontender.  Normal active bowel sounds  Musculoskeletal:left hip: Painful internal rotation.  Painful flexionextension.  Neurovascular intact distally. Labs: Recent Results (from the past 2160 hour(s))  Surgical pcr screen     Status: Abnormal   Collection Time: 05/20/16  9:03 AM  Result Value Ref Range   MRSA, PCR NEGATIVE NEGATIVE   Staphylococcus aureus POSITIVE (A) NEGATIVE    Comment:        The Xpert SA Assay (FDA approved for NASAL specimens in patients over 73 years of age), is one component of a comprehensive surveillance program.  Test performance has been validated by [facility] for patients greater than or equal to 32 year old. It is not intended to diagnose infection nor to guide or monitor treatment.   Urinalysis, Routine w reflex microscopic (not at [facility])     Status: Abnormal   Collection Time: 05/20/16  9:03 AM  Result Value Ref Range   Color, Urine YELLOW YELLOW   APPearance CLEAR CLEAR   Specific Gravity, Urine 1.015 1.005 - 1.030   pH 5.5 5.0 - 8.0   Glucose, UA NEGATIVE NEGATIVE mg/dL   Hgb urine dipstick NEGATIVE NEGATIVE   Bilirubin Urine NEGATIVE NEGATIVE   Ketones, ur NEGATIVE NEGATIVE mg/dL   Protein, ur NEGATIVE NEGATIVE mg/dL   Nitrite NEGATIVE NEGATIVE   Leukocytes, UA SMALL (A) NEGATIVE  Urinalysis, Microscopic (reflex)     Status: Abnormal   Collection Time: 05/20/16  9:03 AM  Result Value Ref Range   RBC / HPF 0-5 0 - 5 RBC/hpf   WBC, UA 6-30 0 - 5 WBC/hpf   Bacteria, UA RARE (A) NONE SEEN   Squamous Epithelial / LPF NONE SEEN NONE SEEN  APTT     Status: Abnormal   Collection Time: 05/20/16  9:04 AM  Result Value Ref Range   aPTT 41 (H) 24 - 36 seconds    Comment:        IF BASELINE aPTT IS ELEVATED, SUGGEST  PATIENT RISK ASSESSMENT BE USED TO DETERMINE APPROPRIATE ANTICOAGULANT THERAPY.   CBC WITH DIFFERENTIAL     Status: None   Collection Time: 05/20/16  9:04 AM  Result Value Ref Range   WBC 7.1 4.0 - 10.5 K/uL   RBC 4.97 4.22 - 5.81 MIL/uL   Hemoglobin 13.8 13.0 - 17.0 g/dL   HCT 42.6 39.0 - 52.0 %   MCV 85.7 78.0 - 100.0 fL   MCH 27.8 26.0 - 34.0 pg   MCHC 32.4 30.0 - 36.0 g/dL   RDW 14.6 11.5 - 15.5 %   Platelets 249 150 - 400 K/uL   Neutrophils Relative % 61 %   Neutro Abs 4.4 1.7 - 7.7 K/uL   Lymphocytes Relative 29 %   Lymphs Abs 2.0 0.7 - 4.0 K/uL   Monocytes Relative 9 %   Monocytes Absolute 0.6 0.1 - 1.0  K/uL   Eosinophils Relative 1 %   Eosinophils Absolute 0.1 0.0 - 0.7 K/uL   Basophils Relative 0 %   Basophils Absolute 0.0 0.0 - 0.1 K/uL  Comprehensive metabolic panel     Status: Abnormal   Collection Time: 05/20/16  9:04 AM  Result Value Ref Range   Sodium 140 135 - 145 mmol/L   Potassium 3.9 3.5 - 5.1 mmol/L   Chloride 108 101 - 111 mmol/L   CO2 25 22 - 32 mmol/L   Glucose, Bld 109 (H) 65 - 99 mg/dL   BUN 13 6 - 20 mg/dL   Creatinine, Ser 0.96 0.61 - 1.24 mg/dL   Calcium 9.5 8.9 - 10.3 mg/dL   Total Protein 7.0 6.5 - 8.1 g/dL   Albumin 3.9 3.5 - 5.0 g/dL   AST 29 15 - 41 U/L   ALT 23 17 - 63 U/L   Alkaline Phosphatase 70 38 - 126 U/L   Total Bilirubin 0.7 0.3 - 1.2 mg/dL   GFR calc non Af Amer >60 >60 mL/min   GFR calc Af Amer >60 >60 mL/min    Comment: (NOTE) The eGFR has been calculated using the CKD EPI equation. This calculation has not been validated in all clinical situations. eGFR's persistently <60 mL/min signify possible Chronic Kidney Disease.    Anion gap 7 5 - 15  Protime-INR     Status: None   Collection Time: 05/20/16  9:04 AM  Result Value Ref Range   Prothrombin Time 13.7 11.4 - 15.2 seconds   INR 1.05   Type and screen Order type and screen if day of surgery is less than 15 days from draw of preadmission visit or order morning of  surgery if day of surgery is greater than 6 days from preadmission visit.     Status: None   Collection Time: 05/20/16  9:06 AM  Result Value Ref Range   ABO/RH(D) O POS    Antibody Screen NEG    Sample Expiration 06/03/2016    Extend sample reason NO TRANSFUSIONS OR PREGNANCY IN THE PAST 3 MONTHS     Estimated body mass index is 25.16 kg/m as calculated from the following:   Height as of 05/20/16: 5' 6" (1.676 m).   Weight as of 05/20/16: 70.7 kg (155 lb 13.8 oz).   Imaging Review Plain radiographs demonstrate moderate degenerative joint disease of the left hip(s). The bone quality appears to be fair for age and reported activity level.  Assessment/Plan:  End stage arthritis, left hip(s)  The patient history, physical examination, clinical judgement of the provider and imaging studies are consistent with end stage degenerative joint disease of the left hip(s) and total hip arthroplasty is deemed medically necessary. The treatment options including medical management, injection therapy, arthroscopy and arthroplasty were discussed at length. The risks and benefits of total hip arthroplasty were presented and reviewed. The risks due to aseptic loosening, infection, stiffness, dislocation/subluxation,  thromboembolic complications and other imponderables were discussed.  The patient acknowledged the explanation, agreed to proceed with the plan and consent was signed. Patient is being admitted for inpatient treatment for surgery, pain control, PT, OT, prophylactic antibiotics, VTE prophylaxis, progressive ambulation and ADL's and discharge planning.The patient is planning to be discharged home with home health services

## 2016-05-26 NOTE — Transfer of Care (Signed)
Immediate Anesthesia Transfer of Care Note  Patient: Keith Cunningham  Procedure(s) Performed: Procedure(s): TOTAL HIP ARTHROPLASTY ANTERIOR APPROACH (Left)  Patient Location: PACU  Anesthesia Type:MAC and Spinal  Level of Consciousness: awake and alert   Airway & Oxygen Therapy: Patient Spontanous Breathing and Patient connected to face mask oxygen  Post-op Assessment: Report given to RN and Post -op Vital signs reviewed and stable  Post vital signs: Reviewed and stable  Last Vitals:  Vitals:   05/26/16 1205  BP: (!) 156/87  Pulse: 74  Resp: 18  Temp: 36.5 C    Last Pain:  Vitals:   05/26/16 1210  TempSrc:   PainSc: 8          Complications: No apparent anesthesia complications

## 2016-05-26 NOTE — Discharge Instructions (Signed)

## 2016-05-26 NOTE — Brief Op Note (Signed)
05/26/2016  3:24 PM  PATIENT:  Keith Cunningham  75 y.o. male  PRE-OPERATIVE DIAGNOSIS:  OSTEOARTHRITIS LEFT HIP  POST-OPERATIVE DIAGNOSIS:  OSTEOARTHRITIS LEFT HIP  PROCEDURE:  Procedure(s): TOTAL HIP ARTHROPLASTY ANTERIOR APPROACH (Left)  SURGEON:  Surgeon(s) and Role:    * Dorna Leitz, MD - Primary  PHYSICIAN ASSISTANT:   ASSISTANTS: bethune   ANESTHESIA:   spinal  EBL:  Total I/O In: 1000 [I.V.:1000] Out: 250 [Blood:250]  BLOOD ADMINISTERED:none  DRAINS: none   LOCAL MEDICATIONS USED:  MARCAINE    and OTHER experel   SPECIMEN:  No Specimen  DISPOSITION OF SPECIMEN:  N/A  COUNTS:  YES  TOURNIQUET:  * No tourniquets in log *  DICTATION: .Other Dictation: Dictation Number [id number]  PLAN OF CARE: Admit to inpatient   PATIENT DISPOSITION:  PACU - hemodynamically stable.   Delay start of Pharmacological VTE agent (>24hrs) due to surgical blood loss or risk of bleeding: no

## 2016-05-26 NOTE — Anesthesia Preprocedure Evaluation (Addendum)
Anesthesia Evaluation  Patient identified by MRN, date of birth, ID band Patient awake    Reviewed: Allergy & Precautions, NPO status , Patient's Chart, lab work & pertinent test results  Airway Mallampati: II  TM Distance: >3 FB Neck ROM: Full    Dental  (+) Teeth Intact, Dental Advisory Given   Pulmonary neg pulmonary ROS,    breath sounds clear to auscultation- rhonchi       Cardiovascular + angina + CAD and + Past MI   Rhythm:Regular Rate:Normal     Neuro/Psych negative neurological ROS  negative psych ROS   GI/Hepatic Neg liver ROS, hiatal hernia,   Endo/Other  negative endocrine ROS  Renal/GU negative Renal ROS  negative genitourinary   Musculoskeletal  (+) Arthritis , Osteoarthritis,    Abdominal   Peds negative pediatric ROS (+)  Hematology negative hematology ROS (+)   Anesthesia Other Findings   Reproductive/Obstetrics negative OB ROS                            Anesthesia Physical Anesthesia Plan  ASA: II  Anesthesia Plan: Spinal   Post-op Pain Management:    Induction: Intravenous  Airway Management Planned: Natural Airway  Additional Equipment:   Intra-op Plan:   Post-operative Plan:   Informed Consent: I have reviewed the patients History and Physical, chart, labs and discussed the procedure including the risks, benefits and alternatives for the proposed anesthesia with the patient or authorized representative who has indicated his/her understanding and acceptance.     Plan Discussed with: CRNA  Anesthesia Plan Comments:         Anesthesia Quick Evaluation

## 2016-05-27 ENCOUNTER — Encounter (HOSPITAL_COMMUNITY): Payer: Self-pay | Admitting: Orthopedic Surgery

## 2016-05-27 LAB — CBC
HCT: 36 % — ABNORMAL LOW (ref 39.0–52.0)
HEMOGLOBIN: 11.5 g/dL — AB (ref 13.0–17.0)
MCH: 27.4 pg (ref 26.0–34.0)
MCHC: 31.9 g/dL (ref 30.0–36.0)
MCV: 85.9 fL (ref 78.0–100.0)
Platelets: 212 10*3/uL (ref 150–400)
RBC: 4.19 MIL/uL — ABNORMAL LOW (ref 4.22–5.81)
RDW: 14.8 % (ref 11.5–15.5)
WBC: 13.1 10*3/uL — AB (ref 4.0–10.5)

## 2016-05-27 LAB — BASIC METABOLIC PANEL
Anion gap: 10 (ref 5–15)
BUN: 13 mg/dL (ref 6–20)
CO2: 21 mmol/L — ABNORMAL LOW (ref 22–32)
Calcium: 8.8 mg/dL — ABNORMAL LOW (ref 8.9–10.3)
Chloride: 106 mmol/L (ref 101–111)
Creatinine, Ser: 0.98 mg/dL (ref 0.61–1.24)
GFR calc Af Amer: >60 mL/min (ref >60)
GFR calc non Af Amer: >60 mL/min (ref >60)
Glucose, Bld: 156 mg/dL — ABNORMAL HIGH (ref 65–99)
Potassium: 4.2 mmol/L (ref 3.5–5.1)
Sodium: 137 mmol/L (ref 135–145)

## 2016-05-27 NOTE — Evaluation (Signed)
Physical Therapy Evaluation Patient Details Name: Keith Cunningham MRN: [id number] DOB: 1941-05-14 Today's Date: 05/27/2016   History of Present Illness  75 y.o. male s/p L THA anterior approach.  Clinical Impression  Pt is s/p THA resulting in the deficits listed below (see PT Problem List). See below for eval details. Pt will benefit from skilled PT to increase their independence and safety with mobility to allow discharge to the venue listed below. Pt to receive one additional PT Rx prior to d/c home this PM.     Follow Up Recommendations Home health PT;Supervision for mobility/OOB    Equipment Recommendations  Rolling walker with 5" wheels;3in1 (PT)    Recommendations for Other Services       Precautions / Restrictions Precautions Precautions: Fall Restrictions Weight Bearing Restrictions: Yes LLE Weight Bearing: Weight bearing as tolerated      Mobility  Bed Mobility Overal bed mobility: Needs Assistance Bed Mobility: Supine to Sit     Supine to sit: Min assist     General bed mobility comments: +rail, verbal cue for sequencing  Transfers Overall transfer level: Needs assistance Equipment used: Rolling walker (2 wheeled) Transfers: Sit to/from Stand Sit to Stand: Min guard         General transfer comment: verbal cues for hand placement  Ambulation/Gait Ambulation/Gait assistance: Min guard Ambulation Distance (Feet): 350 Feet Assistive device: Rolling walker (2 wheeled) Gait Pattern/deviations: Step-through pattern;Decreased stride length;Antalgic Gait velocity: mildly decreased Gait velocity interpretation: Below normal speed for age/gender General Gait Details: steady gait, verbal cues for improved posture and to relax shoulders  Stairs            Wheelchair Mobility    Modified Rankin (Stroke Patients Only)       Balance                                             Pertinent Vitals/Pain Pain Assessment:  0-10 Pain Score: 2  Pain Location: L hip Pain Descriptors / Indicators: Aching Pain Intervention(s): Monitored during session;Repositioned;Ice applied    Home Living Family/patient expects to be discharged to:: Private residence Living Arrangements: Spouse/significant other Available Help at Discharge: Family;Available 24 hours/day Type of Home: Apartment Home Access: Level entry     Home Layout: One level Home Equipment: None      Prior Function Level of Independence: Independent               Hand Dominance        Extremity/Trunk Assessment   Upper Extremity Assessment: Defer to OT evaluation           Lower Extremity Assessment: LLE deficits/detail   LLE Deficits / Details: mild weakness due to sx  Cervical / Trunk Assessment: Normal  Communication   Communication: No difficulties  Cognition Arousal/Alertness: Awake/alert Behavior During Therapy: WFL for tasks assessed/performed Overall Cognitive Status: Within Functional Limits for tasks assessed                      General Comments      Exercises Total Joint Exercises Ankle Circles/Pumps: AROM;Both;10 reps Quad Sets: AROM;Both;10 reps Short Arc Quad: AROM;Left;10 reps Heel Slides: AROM;Left;10 reps Hip ABduction/ADduction: AROM;Left;10 reps   Assessment/Plan    PT Assessment Patient needs continued PT services  PT Problem List Decreased strength;Decreased activity tolerance;Pain;Decreased balance;Decreased mobility;Decreased knowledge of use of DME  PT Treatment Interventions DME instruction;Gait training;Stair training;Functional mobility training;Balance training;Therapeutic exercise;Therapeutic activities;Patient/family education    PT Goals (Current goals can be found in the Care Plan section)  Acute Rehab PT Goals Patient Stated Goal: independence PT Goal Formulation: With patient Time For Goal Achievement: 06/03/16 Potential to Achieve Goals: Good     Frequency Other (Comment) (1 additional Rx prior to d/c home this PM)   Barriers to discharge        Co-evaluation               End of Session Equipment Utilized During Treatment: Gait belt Activity Tolerance: Patient tolerated treatment well Patient left: in chair;with call bell/phone within reach;with family/visitor present Nurse Communication: Mobility status         Time: [id number] PT Time Calculation (min) (ACUTE ONLY): 29 min   Charges:   PT Evaluation $PT Eval Moderate Complexity: 1 Procedure PT Treatments $Gait Training: 8-22 mins   PT G Codes:        Lorriane Shire 05/27/2016, 11:45 AM

## 2016-05-27 NOTE — Progress Notes (Signed)
Patient discharged to home with belongings, IVs removed. AVS and prescriptions given. Teach back performed. Patient stable at time of discharge.

## 2016-05-27 NOTE — Progress Notes (Signed)
Physical Therapy Treatment Patient Details Name: Keith Cunningham MRN: [id number] DOB: [birth year] Today's Date: 05/27/2016    History of Present Illness 75 y.o. male s/p L THA anterior approach.    PT Comments    Pt performed increased mobility including increased gait and review of HEP in it's entirety.  PTA answered wife and patient's questions.  At this time patient is safe to d/c home.    Follow Up Recommendations  Home health PT;Supervision for mobility/OOB     Equipment Recommendations  Rolling walker with 5" wheels;3in1 (PT)    Recommendations for Other Services       Precautions / Restrictions Precautions Precautions: Fall Restrictions Weight Bearing Restrictions: Yes LLE Weight Bearing: Weight bearing as tolerated    Mobility  Bed Mobility Overal bed mobility: Needs Assistance Bed Mobility: Supine to Sit     Supine to sit: Min assist     General bed mobility comments: pt received in recliner on arrival.   Transfers Overall transfer level: Needs assistance Equipment used: Rolling walker (2 wheeled) Transfers: Sit to/from Stand Sit to Stand: Supervision         General transfer comment: Pt presents with poor technique intially with education to scoot forward to improve body position before transferring, transfer improved.    Ambulation/Gait Ambulation/Gait assistance: Supervision Ambulation Distance (Feet): 1000 Feet Assistive device: Rolling walker (2 wheeled) Gait Pattern/deviations: Step-through pattern;Trunk flexed Gait velocity: mildly decreased Gait velocity interpretation: Below normal speed for age/gender General Gait Details: Cues for upper trunk control.  Poor safety awareness of IV line, this should not be an issue at home.  Pt impulsive and required cues to slow pacing.     Stairs            Wheelchair Mobility    Modified Rankin (Stroke Patients Only)       Balance Overall balance assessment: Needs assistance    Sitting balance-Leahy Scale: Normal       Standing balance-Leahy Scale: Good                      Cognition Arousal/Alertness: Awake/alert Behavior During Therapy: WFL for tasks assessed/performed Overall Cognitive Status: Within Functional Limits for tasks assessed                      Exercises Total Joint Exercises Ankle Circles/Pumps: AROM;Both;10 reps;Supine Quad Sets: AROM;Both;10 reps;Supine Short Arc Quad: AROM;Left;10 reps;Supine Heel Slides: AROM;Left;10 reps;Supine Hip ABduction/ADduction: AROM;Left;20 reps;Supine;Standing (x10 supine and x10 standing.  ) Long Arc Quad: AROM;Left;10 reps;Seated Knee Flexion: AROM;Left;10 reps;Standing Marching in Standing: AROM;Left;10 reps;Standing Standing Hip Extension: AROM;Left;10 reps;Standing    General Comments        Pertinent Vitals/Pain Pain Assessment: 0-10 Pain Score: 2  Pain Location: L quad proximal Pain Descriptors / Indicators: Burning;Tightness Pain Intervention(s): Monitored during session;Repositioned    Home Living Family/patient expects to be discharged to:: Private residence Living Arrangements: Spouse/significant other Available Help at Discharge: Family;Available 24 hours/day Type of Home: Apartment Home Access: Level entry   Home Layout: One level Home Equipment: None      Prior Function Level of Independence: Independent          PT Goals (current goals can now be found in the care plan section) Acute Rehab PT Goals Patient Stated Goal: independence PT Goal Formulation: With patient Time For Goal Achievement: 06/03/16 Potential to Achieve Goals: Good Progress towards PT goals: Progressing toward goals    Frequency    Other (  Comment) (1 additional Rx prior to d/c home this PM)      PT Plan Current plan remains appropriate    Co-evaluation             End of Session Equipment Utilized During Treatment: Gait belt Activity Tolerance: Patient tolerated  treatment well Patient left: in chair;with call bell/phone within reach;with family/visitor present     Time: [id number] PT Time Calculation (min) (ACUTE ONLY): 26 min  Charges:  $Gait Training: 8-22 mins $Therapeutic Exercise: 8-22 mins                    G Codes:      Cristela Blue 05/30/2016, 1:16 PM  Governor Rooks, PTA pager [phone]

## 2016-05-27 NOTE — Progress Notes (Signed)
Subjective: 1 Day Post-Op Procedure(s) (LRB): TOTAL HIP ARTHROPLASTY ANTERIOR APPROACH (Left) Patient reports pain as mild.    Objective: Vital signs in last 24 hours: Temp:  [97 F (36.1 C)-98 F (36.7 C)] 97.6 F (36.4 C) (12/12 0426) Pulse Rate:  [55-89] 89 (12/12 0426) Resp:  [13-26] 16 (12/12 0426) BP: (113-156)/(65-87) 126/65 (12/12 0426) SpO2:  [94 %-100 %] 94 % (12/12 0426) Weight:  [70.7 kg (155 lb 13.8 oz)] 70.7 kg (155 lb 13.8 oz) (12/11 1205)  Intake/Output from previous day: 12/11 0701 - 12/12 0700 In: 1000 [I.V.:1000] Out: 1500 [Urine:1250; Blood:250] Intake/Output this shift: No intake/output data recorded.   Recent Labs  05/27/16 0505  HGB 11.5*    Recent Labs  05/27/16 0505  WBC 13.1*  RBC 4.19*  HCT 36.0*  PLT 212    Recent Labs  05/27/16 0505  NA 137  K 4.2  CL 106  CO2 21*  BUN 13  CREATININE 0.98  GLUCOSE 156*  CALCIUM 8.8*   No results for input(s): LABPT, INR in the last 72 hours.  Neurologically intact ABD soft Neurovascular intact Sensation intact distally Intact pulses distally Dorsiflexion/Plantar flexion intact No cellulitis present Compartment soft  Assessment/Plan: 1 Day Post-Op Procedure(s) (LRB): TOTAL HIP ARTHROPLASTY ANTERIOR APPROACH (Left) Advance diet Up with therapy Discharge home with home health later today if clears PT or tomorrow if not cleared  Keith Cunningham 05/27/2016, 7:21 AM

## 2016-05-27 NOTE — Op Note (Signed)
NAME:  Keith Cunningham, Keith Cunningham                  ACCOUNT NO.:  MEDICAL RECORD NO.:  [id number]  LOCATION:                                 FACILITY:  PHYSICIAN:  Alta Corning, M.D.        DATE OF BIRTH:  DATE OF PROCEDURE:  05/26/2016 DATE OF DISCHARGE:                              OPERATIVE REPORT   PREOPERATIVE DIAGNOSIS:  End-stage degenerative joint disease, left hip.  POSTOPERATIVE DIAGNOSIS:  End-stage degenerative joint disease, left hip.  PROCEDURE:  1.Left total hip replacement with a Corail System size 14 femur, size 56-mm porous-coated cup, +4 neutral liner, and a 36+ 5-mm hip ball. 2. Interpretation of multiple intra-operative images SURGEON:  Alta Corning, M.D.  ASSISTANT:  Gary Fleet, P.A.  ANESTHESIA:  General.  BRIEF HISTORY:  Keith Cunningham is a 75 year old male with a long history of significant complaints of left hip pain.  He had been treated conservatively for a prolonged period of time.  X-rays were not that severe in terms of arthritis.  We got imaging of his back which was unremarkable.  We did an intra-articular injection of the hip which gave him excellent short-term relief but the pain recurred.  We talked about treatment options.  The patient was incapacitated with pain and could not live with ongoing pain, and after failure of all conservative care and workup as best we could which pointed to the hip being the primary source of pain, he was taken to the operating room for left total hip replacement.  DESCRIPTION OF PROCEDURE:  The patient was taken to the operating room. After adequate anesthesia was obtained with general anesthetic, the patient was placed supine on the operating table.  The patient was then moved onto the [place] bed and all bony prominences were well padded. Attention was then turned to the left hip which was prepped and draped in usual sterile fashion.  Following this, an incision was made for an anterior approach to the hip,  subcutaneous tissue down to the level of the tensor fascia, which was identified and a small rent was made in the fascia of the muscle was finger fractured and retractors were put in place above and below the neck.  At this time, the capsule was opened and tagged.  Provisional neck cut was made and retractors put in place and attention turned to the acetabulum with traction on the hip and external rotation.  The acetabulum was identified and sequentially reamed to a level of 55 mm.  A 56-mm porous-coated cup was hammered into place with 45 degrees of lateral opening and 30 degrees of anteversion. A +4 neutral liner was placed.  Attention was then turned to the stem side.  The arm from the [place] bed was placed followed by retractors and at this point, the femur was opened and sequentially rasped to a level of 13 mm.  We then trialed this, it appeared to be about 5-7 mm short on the operative side at this point, and at this point, we went to a size 14 stem which left a little proud which we were happy with, trialed it with a +0 and ultimately went to  a +5 which gave [place] symmetric leg length.  At this point, the final implant was placed.  The +5 was placed with it and the hip reduced.  X-rays taken for confirmation of leg length.  The hip was then irrigated and suctioned dry.  The anterior capsule was closed with 1 Vicryl running, and once this was done, the hip was again irrigated.  Exparel was placed throughout the soft tissues surrounding the hip for postoperative pain control, and the tensor fascia was then closed with 0 Vicryl running, skin with 0 and 2-0 Vicryl, and 3-0 Monocryl subcuticular.  Benzoin and Steri-Strips were applied. Sterile compressive dressing was applied.  The patient was taken to the recovery room where he was noted to be in satisfactory condition. Estimated blood loss for the procedure was minimal.     Alta Corning, M.D.     Corliss Skains  D:  05/26/2016  T:   05/27/2016  Job:  [id number]

## 2016-05-27 NOTE — Evaluation (Signed)
Occupational Therapy Evaluation/Discharge Patient Details Name: Keith Cunningham MRN: [id number] DOB: [birth year] Today's Date: 05/27/2016    History of Present Illness 75 y.o. male s/p L THA anterior approach.   Clinical Impression   PTA, pt was independent with ADL and IADL and maintained an active lifestyle. Pt currently requires min assist for LB ADL and supervision for all functional mobility. Pt's wife demonstrated ability to provide all necessary assistance. All education complete concerning ADL post-operatively and pt demonstrates understanding. No further acute OT needs identified. Recommend D/C home with 24 hour assistance from wife and no OT follow-up necessary. OT will sign off.    Follow Up Recommendations  No OT follow up;Supervision/Assistance - 24 hour    Equipment Recommendations  3 in 1 bedside commode    Recommendations for Other Services       Precautions / Restrictions Precautions Precautions: Fall Restrictions Weight Bearing Restrictions: Yes LLE Weight Bearing: Weight bearing as tolerated      Mobility Bed Mobility               General bed mobility comments: pt received in recliner on arrival.   Transfers Overall transfer level: Needs assistance Equipment used: Rolling walker (2 wheeled) Transfers: Sit to/from Stand Sit to Stand: Supervision         General transfer comment: Pt presents with poor technique intially with education to scoot forward to improve body position before transferring, transfer improved.      Balance Overall balance assessment: Needs assistance Sitting-balance support: Feet supported;No upper extremity supported Sitting balance-Leahy Scale: Normal     Standing balance support: No upper extremity supported;Bilateral upper extremity supported;During functional activity Standing balance-Leahy Scale: Good Standing balance comment: B UE support required for ambulation; able to stand without UE support statically.                             ADL Overall ADL's : Needs assistance/impaired     Grooming: Supervision/safety;Standing   Upper Body Bathing: Set up;Sitting   Lower Body Bathing: Minimal assistance;Sit to/from stand;With caregiver independent assisting   Upper Body Dressing : Set up;Sitting   Lower Body Dressing: Minimal assistance;With caregiver independent assisting;Sit to/from stand   Toilet Transfer: Supervision/safety;Ambulation;RW;BSC   Toileting- Water quality scientist and Hygiene: Supervision/safety;Sit to/from stand   Tub/ Shower Transfer: Supervision/safety;Tub transfer;3 in 1;Ambulation;Rolling walker   Functional mobility during ADLs: Supervision/safety;Rolling walker General ADL Comments: Pt and wife educated on dressing techniques, safe use of DME, safe toilet and shower transfers, and fall prevention.     Vision Vision Assessment?: No apparent visual deficits   Perception     Praxis      Pertinent Vitals/Pain Pain Assessment: 0-10 Pain Score: 1  Pain Location: L hip Pain Descriptors / Indicators: Burning;Tightness Pain Intervention(s): Limited activity within patient's tolerance;Monitored during session;Repositioned     Hand Dominance Right   Extremity/Trunk Assessment Upper Extremity Assessment Upper Extremity Assessment: Overall WFL for tasks assessed   Lower Extremity Assessment Lower Extremity Assessment: LLE deficits/detail LLE Deficits / Details: decreased strength and ROM as expected post-operatively.       Communication Communication Communication: No difficulties   Cognition Arousal/Alertness: Awake/alert Behavior During Therapy: WFL for tasks assessed/performed Overall Cognitive Status: Within Functional Limits for tasks assessed                     General Comments       Exercises  Shoulder Instructions      Home Living Family/patient expects to be discharged to:: Private residence Living Arrangements:  Spouse/significant other Available Help at Discharge: Family;Available 24 hours/day Type of Home: Apartment Home Access: Level entry     Home Layout: One level     Bathroom Shower/Tub: Tub/shower unit Shower/tub characteristics: Curtain Biochemist, clinical: Handicapped height Bathroom Accessibility: Yes   Home Equipment: None          Prior Functioning/Environment Level of Independence: Independent                 OT Problem List: Decreased strength;Decreased range of motion;Decreased activity tolerance;Impaired balance (sitting and/or standing);Decreased safety awareness;Decreased knowledge of use of DME or AE;Decreased knowledge of precautions;Pain   OT Treatment/Interventions:      OT Goals(Current goals can be found in the care plan section) Acute Rehab OT Goals Patient Stated Goal: independence OT Goal Formulation: With patient Time For Goal Achievement: 06/10/16 Potential to Achieve Goals: Good  OT Frequency:     Barriers to D/C:            Co-evaluation              End of Session Equipment Utilized During Treatment: Gait belt;Rolling walker Nurse Communication: Mobility status;Other (comment) (OT completed)  Activity Tolerance: Patient tolerated treatment well Patient left: in chair;with call bell/phone within reach;with family/visitor present   Time: [id number] OT Time Calculation (min): 28 min Charges:  OT General Charges $OT Visit: 1 Procedure OT Evaluation $OT Eval Moderate Complexity: 1 Procedure OT Treatments $Self Care/Home Management : 8-22 mins  Norman Herrlich, OTR/L [phone] 05/27/2016, 4:03 PM

## 2016-05-27 NOTE — Care Management Note (Signed)
Case Management Note  Patient Details  Name: Keith Cunningham MRN: [id number] Date of Birth: [birth year]  Subjective/Objective:   75 yr old gentleman s/p left total hip arthroplasty.                 Action/Plan: Case manager spoke with [employer]ient and his wife concerning [place] and DME needs. Patient states he was preoperatively setup with [place], CM contacted karen Nusbaumm to confirm. RW and 3in1 have been delivered to patient's room. He will have family support at discharge.    Expected Discharge Date:    05/27/16              Expected Discharge Plan:  [place]  In-House Referral:  NA  Discharge planning Services     Post Acute Care Choice:  Home Health, Durable Medical Equipment Choice offered to:  Patient  DME Arranged:  3-N-1, Walker rolling DME Agency:  [employer]  HH Arranged:  PT [place]:  [facility]  Status of Service:  Completed, signed off  If discussed at [place] of Stay Meetings, dates discussed:    Additional Comments:  Ninfa Meeker, RN 05/27/2016, 2:37 PM

## 2016-05-28 NOTE — Discharge Summary (Signed)
Patient ID: SHANKAR STULL MRN: [id number] DOB/AGE: 1941/06/06 75 y.o.  Admit date: 05/26/2016 Discharge date: 05/27/2016 Admission Diagnoses:  Principal Problem:   Primary osteoarthritis of left hip   Discharge Diagnoses:  Same  Past Medical History:  Diagnosis Date  . Anemia   . Anginal pain ([place]) 04/21/2012  . Arthritis   . Bleeding internal hemorrhoids 2012  . Coronary artery disease   . H/O hiatal hernia   . History of blood transfusion 2012   "related to bleeding hemorrhoids" (05/06/2012)  . Hyperlipidemia   . 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 Myocardial infarction 04/21/2012  . Pneumonia    "as a child" (05/06/2012)  . 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     Surgeries: Procedure(s):Left TOTAL HIP ARTHROPLASTY ANTERIOR APPROACH on 05/26/2016   Discharged Condition: Improved  Hospital Course: DEXTER WILBOURNE is an 75 y.o. male who was admitted 05/26/2016 for operative treatment ofPrimary osteoarthritis of left hip. Patient has severe unremitting pain that affects sleep, daily activities, and work/hobbies. After pre-op clearance the patient was taken to the operating room on 05/26/2016 and underwent  Procedure(s): Left [place].    Patient was given perioperative antibiotics: Anti-infectives    Start     Dose/Rate Route Frequency Ordered Stop   05/26/16 2000  ceFAZolin (ANCEF) IVPB 2g/100 mL premix     2 g 200 mL/hr over 30 Minutes Intravenous Every 6 hours 05/26/16 1828 05/27/16 0158   05/26/16 1155  ceFAZolin (ANCEF) IVPB 2g/100 mL premix     2 g 200 mL/hr over 30 Minutes Intravenous On call to O.R. 05/26/16 1155 05/26/16 1355       Patient was given sequential compression devices, early ambulation, and chemoprophylaxis to prevent DVT.The patient progressed well with physical therapy. At the date of  discharge his vital signs are stable he was afebrile. His left hip dressing was benign and he was ambulating well with physical therapy on oral pain medication.  Patient benefited maximally from hospital stay and there were no complications.    Recent vital signs: Patient Vitals for the past 24 hrs:  BP Temp Temp src Pulse Resp SpO2  05/27/16 1337 130/72 98.2 F (36.8 C) Oral 92 16 97 %     Recent laboratory studies:  Recent Labs  05/27/16 0505  WBC 13.1*  HGB 11.5*  HCT 36.0*  PLT 212  NA 137  K 4.2  CL 106  CO2 21*  BUN 13  CREATININE 0.98  GLUCOSE 156*  CALCIUM 8.8*     Discharge Medications:     Medication List    TAKE these medications   aspirin EC 325 MG tablet Take 1 tablet (325 mg total) by mouth 2 (two) times daily after a meal. Take x 1 month post op to decrease risk of blood clots. What changed:  medication strength  how much to take  when to take this  additional instructions   CoQ10 200 MG Caps Take 200 mg by mouth daily.   docusate sodium 100 MG capsule Commonly known as:  COLACE Take 1 capsule (100 mg total) by mouth 2 (two) times daily.   fish oil-omega-3 fatty acids 1000 MG capsule Take 1 g by mouth 3 (three) times daily.   HYDROcodone-acetaminophen 10-325 MG tablet Commonly known as:  NORCO Take 1-2 tablets by mouth every  6 (six) hours as needed.   multivitamin with minerals Tabs tablet Take 1 tablet by mouth daily. One-A-Day   oxyCODONE-acetaminophen 5-325 MG tablet Commonly known as:  PERCOCET/ROXICET Take 1-2 tablets by mouth every 4 (four) hours as needed for severe pain.   tiZANidine 2 MG tablet Commonly known as:  ZANAFLEX Take 1 tablet (2 mg total) by mouth every 8 (eight) hours as needed for muscle spasms.       Diagnostic Studies: Dg Chest 2 View  Result Date: 05/20/2016 CLINICAL DATA:  Preop evaluation for upcoming hip replacement EXAM: CHEST  2 VIEW COMPARISON:  02/27/2014 FINDINGS: Cardiac shadow is stable.  Postsurgical changes are again seen. The lungs are clear bilaterally. Mild degenerative change of the thoracic spine is noted. IMPRESSION: No active cardiopulmonary disease. Electronically Signed   By: Inez Catalina M.D.   On: 05/20/2016 09:02   Dg C-arm 1-60 Min  Result Date: 05/26/2016 CLINICAL DATA:  Left total hip replacement. FLUOROSCOPY TIME:  17 seconds C-arm fluoroscopic images were obtained intraoperatively and submitted for post operative interpretation. Please see the performing provider's procedural report for the fluoroscopy time utilized. EXAM: DG C-ARM 61-120 MIN; OPERATIVE LEFT HIP WITH PELVIS COMPARISON:  None. FINDINGS: Two C-arm images demonstrate the patient has undergone left total hip replacement. The acetabular and femoral components appear in good position in the AP projection. IMPRESSION: Satisfactory appearance of left hip in the AP projection after total hip prosthesis insertion. Electronically Signed   By: Lorriane Shire M.D.   On: 05/26/2016 15:44   Dg Hip Operative Unilat W Or W/o Pelvis Left  Result Date: 05/26/2016 CLINICAL DATA:  Left total hip replacement. FLUOROSCOPY TIME:  17 seconds C-arm fluoroscopic images were obtained intraoperatively and submitted for post operative interpretation. Please see the performing provider's procedural report for the fluoroscopy time utilized. EXAM: DG C-ARM 61-120 MIN; OPERATIVE LEFT HIP WITH PELVIS COMPARISON:  None. FINDINGS: Two C-arm images demonstrate the patient has undergone left total hip replacement. The acetabular and femoral components appear in good position in the AP projection. IMPRESSION: Satisfactory appearance of left hip in the AP projection after total hip prosthesis insertion. Electronically Signed   By: Lorriane Shire M.D.   On: 05/26/2016 15:44    Disposition: 01-Home or Self Care    Follow-up Information    GRAVES,JOHN L, MD. Schedule an appointment as soon as possible for a visit in 2 week(s).   Specialty:   Orthopedic Surgery Contact information: [place] [address] [place] Follow up.   Why:  someone from [place] will contact you to arrange start date and time for therapy Contact information: [address] [place] [address] [phone]            Signed: Erlene Senters 05/28/2016, 11:40 AM

## 2017-03-16 DIAGNOSIS — B958 Unspecified staphylococcus as the cause of diseases classified elsewhere: Secondary | ICD-10-CM

## 2017-03-16 HISTORY — DX: Unspecified staphylococcus as the cause of diseases classified elsewhere: B95.8

## 2017-03-23 ENCOUNTER — Encounter: Payer: Self-pay | Admitting: Interventional Cardiology

## 2017-03-25 ENCOUNTER — Observation Stay (HOSPITAL_COMMUNITY)
Admission: AD | Admit: 2017-03-25 | Discharge: 2017-03-27 | Disposition: A | Discharge status: Home / Self Care | Payer: Medicare Other | Source: Ambulatory Visit | Attending: General Surgery | Admitting: General Surgery

## 2017-03-25 ENCOUNTER — Encounter (HOSPITAL_COMMUNITY)
Admission: AD | Disposition: A | Discharge status: Home / Self Care | Payer: Self-pay | Source: Ambulatory Visit | Attending: General Surgery

## 2017-03-25 ENCOUNTER — Observation Stay (HOSPITAL_COMMUNITY): Payer: Medicare Other | Admitting: Registered Nurse

## 2017-03-25 ENCOUNTER — Ambulatory Visit: Payer: Self-pay | Admitting: General Surgery

## 2017-03-25 ENCOUNTER — Encounter (HOSPITAL_COMMUNITY): Payer: Self-pay

## 2017-03-25 ENCOUNTER — Observation Stay (HOSPITAL_COMMUNITY): Payer: Medicare Other

## 2017-03-25 DIAGNOSIS — I509 Heart failure, unspecified: Secondary | ICD-10-CM | POA: Insufficient documentation

## 2017-03-25 DIAGNOSIS — I252 Old myocardial infarction: Secondary | ICD-10-CM | POA: Diagnosis not present

## 2017-03-25 DIAGNOSIS — Z01818 Encounter for other preprocedural examination: Secondary | ICD-10-CM

## 2017-03-25 DIAGNOSIS — Z7982 Long term (current) use of aspirin: Secondary | ICD-10-CM | POA: Insufficient documentation

## 2017-03-25 DIAGNOSIS — E785 Hyperlipidemia, unspecified: Secondary | ICD-10-CM | POA: Insufficient documentation

## 2017-03-25 DIAGNOSIS — Z791 Long term (current) use of non-steroidal anti-inflammatories (NSAID): Secondary | ICD-10-CM | POA: Insufficient documentation

## 2017-03-25 DIAGNOSIS — L0231 Cutaneous abscess of buttock: Secondary | ICD-10-CM | POA: Diagnosis present

## 2017-03-25 DIAGNOSIS — Z79899 Other long term (current) drug therapy: Secondary | ICD-10-CM | POA: Insufficient documentation

## 2017-03-25 DIAGNOSIS — I2510 Atherosclerotic heart disease of native coronary artery without angina pectoris: Secondary | ICD-10-CM | POA: Insufficient documentation

## 2017-03-25 DIAGNOSIS — Z888 Allergy status to other drugs, medicaments and biological substances status: Secondary | ICD-10-CM | POA: Diagnosis not present

## 2017-03-25 DIAGNOSIS — Z951 Presence of aortocoronary bypass graft: Secondary | ICD-10-CM | POA: Diagnosis not present

## 2017-03-25 HISTORY — PX: INCISION AND DRAINAGE ABSCESS: SHX5864

## 2017-03-25 LAB — BASIC METABOLIC PANEL
ANION GAP: 8 (ref 5–15)
BUN: 14 mg/dL (ref 6–20)
CALCIUM: 9.1 mg/dL (ref 8.9–10.3)
CO2: 25 mmol/L (ref 22–32)
Chloride: 106 mmol/L (ref 101–111)
Creatinine, Ser: 0.85 mg/dL (ref 0.61–1.24)
GLUCOSE: 119 mg/dL — AB (ref 65–99)
POTASSIUM: 3.8 mmol/L (ref 3.5–5.1)
SODIUM: 139 mmol/L (ref 135–145)

## 2017-03-25 LAB — CBC WITH DIFFERENTIAL/PLATELET
BASOS ABS: 0 10*3/uL (ref 0.0–0.1)
BASOS PCT: 0 %
Eosinophils Absolute: 0.1 10*3/uL (ref 0.0–0.7)
Eosinophils Relative: 1 %
HCT: 35.6 % — ABNORMAL LOW (ref 39.0–52.0)
Hemoglobin: 11.4 g/dL — ABNORMAL LOW (ref 13.0–17.0)
LYMPHS PCT: 14 %
Lymphs Abs: 1.6 10*3/uL (ref 0.7–4.0)
MCH: 27.6 pg (ref 26.0–34.0)
MCHC: 32 g/dL (ref 30.0–36.0)
MCV: 86.2 fL (ref 78.0–100.0)
MONO ABS: 1.1 10*3/uL — AB (ref 0.1–1.0)
Monocytes Relative: 9 %
NEUTROS ABS: 8.8 10*3/uL — AB (ref 1.7–7.7)
Neutrophils Relative %: 76 %
PLATELETS: 260 10*3/uL (ref 150–400)
RBC: 4.13 MIL/uL — AB (ref 4.22–5.81)
RDW: 15.3 % (ref 11.5–15.5)
WBC: 11.6 10*3/uL — AB (ref 4.0–10.5)

## 2017-03-25 LAB — MRSA PCR SCREENING: MRSA BY PCR: NEGATIVE

## 2017-03-25 SURGERY — INCISION AND DRAINAGE, ABSCESS
Anesthesia: General | Laterality: Right

## 2017-03-25 MED ORDER — FENTANYL CITRATE (PF) 250 MCG/5ML IJ SOLN
INTRAMUSCULAR | Status: AC
  Filled 2017-03-25: qty 5

## 2017-03-25 MED ORDER — SUCCINYLCHOLINE CHLORIDE 20 MG/ML IJ SOLN
INTRAMUSCULAR | Status: DC | PRN
  Administered 2017-03-25: 140 mg via INTRAVENOUS

## 2017-03-25 MED ORDER — PROPOFOL 10 MG/ML IV BOLUS
INTRAVENOUS | Status: AC
  Filled 2017-03-25: qty 40

## 2017-03-25 MED ORDER — BUPIVACAINE-EPINEPHRINE 0.25% -1:200000 IJ SOLN
INTRAMUSCULAR | Status: DC | PRN
  Administered 2017-03-25: 10 mL

## 2017-03-25 MED ORDER — MIDAZOLAM HCL 5 MG/5ML IJ SOLN
INTRAMUSCULAR | Status: DC | PRN
  Administered 2017-03-25: 0.5 mg via INTRAVENOUS

## 2017-03-25 MED ORDER — DEXAMETHASONE SODIUM PHOSPHATE 10 MG/ML IJ SOLN
INTRAMUSCULAR | Status: AC
  Filled 2017-03-25: qty 1

## 2017-03-25 MED ORDER — PIPERACILLIN-TAZOBACTAM 3.375 G IVPB
3.3750 g | Freq: Three times a day (TID) | INTRAVENOUS | Status: DC
  Administered 2017-03-25: 3.375 g via INTRAVENOUS
  Filled 2017-03-25 (×3): qty 50

## 2017-03-25 MED ORDER — OXYCODONE-ACETAMINOPHEN 5-325 MG PO TABS: 1.0000 | ORAL_TABLET | ORAL | Status: DC | PRN

## 2017-03-25 MED ORDER — FENTANYL CITRATE (PF) 100 MCG/2ML IJ SOLN
INTRAMUSCULAR | Status: AC
  Filled 2017-03-25: qty 2

## 2017-03-25 MED ORDER — CHLORHEXIDINE GLUCONATE CLOTH 2 % EX PADS
6.0000 | MEDICATED_PAD | Freq: Once | CUTANEOUS | Status: AC
  Administered 2017-03-25: 6 via TOPICAL

## 2017-03-25 MED ORDER — DOCUSATE SODIUM 100 MG PO CAPS
100.0000 mg | ORAL_CAPSULE | Freq: Two times a day (BID) | ORAL | Status: DC
  Administered 2017-03-25 – 2017-03-27 (×4): 100 mg via ORAL
  Filled 2017-03-25 (×5): qty 1

## 2017-03-25 MED ORDER — ONDANSETRON 4 MG PO TBDP: 4.0000 mg | ORAL_TABLET | Freq: Four times a day (QID) | ORAL | Status: DC | PRN

## 2017-03-25 MED ORDER — PIPERACILLIN-TAZOBACTAM 3.375 G IVPB
INTRAVENOUS | Status: AC
  Administered 2017-03-26: 3.375 g
  Filled 2017-03-25: qty 50

## 2017-03-25 MED ORDER — MUPIROCIN CALCIUM 2 % EX CREA
TOPICAL_CREAM | Freq: Two times a day (BID) | CUTANEOUS | Status: DC
  Filled 2017-03-25: qty 15

## 2017-03-25 MED ORDER — SUCCINYLCHOLINE CHLORIDE 200 MG/10ML IV SOSY
PREFILLED_SYRINGE | INTRAVENOUS | Status: AC
  Filled 2017-03-25: qty 10

## 2017-03-25 MED ORDER — CHLORHEXIDINE GLUCONATE CLOTH 2 % EX PADS: 6.0000 | MEDICATED_PAD | Freq: Once | CUTANEOUS | Status: DC

## 2017-03-25 MED ORDER — ACETAMINOPHEN 10 MG/ML IV SOLN
INTRAVENOUS | Status: DC | PRN
  Administered 2017-03-25: 1000 mg via INTRAVENOUS

## 2017-03-25 MED ORDER — ONDANSETRON HCL 4 MG/2ML IJ SOLN: 4.0000 mg | Freq: Four times a day (QID) | INTRAMUSCULAR | Status: DC | PRN

## 2017-03-25 MED ORDER — MUPIROCIN 2 % EX OINT
TOPICAL_OINTMENT | Freq: Two times a day (BID) | CUTANEOUS | Status: DC
  Administered 2017-03-25 – 2017-03-26 (×2): via NASAL
  Administered 2017-03-26: 1 via NASAL
  Administered 2017-03-27: 10:00:00 via NASAL
  Filled 2017-03-25: qty 22

## 2017-03-25 MED ORDER — ONDANSETRON HCL 4 MG/2ML IJ SOLN
INTRAMUSCULAR | Status: AC
  Filled 2017-03-25: qty 2

## 2017-03-25 MED ORDER — MEPERIDINE HCL 50 MG/ML IJ SOLN: 6.2500 mg | INTRAMUSCULAR | Status: DC | PRN

## 2017-03-25 MED ORDER — MIDAZOLAM HCL 2 MG/2ML IJ SOLN
INTRAMUSCULAR | Status: AC
  Filled 2017-03-25: qty 2

## 2017-03-25 MED ORDER — PANTOPRAZOLE SODIUM 40 MG IV SOLR: 40.0000 mg | Freq: Every day | INTRAVENOUS | Status: DC

## 2017-03-25 MED ORDER — FENTANYL CITRATE (PF) 100 MCG/2ML IJ SOLN: 25.0000 ug | INTRAMUSCULAR | Status: DC | PRN

## 2017-03-25 MED ORDER — KCL IN DEXTROSE-NACL 20-5-0.9 MEQ/L-%-% IV SOLN
INTRAVENOUS | Status: DC
  Administered 2017-03-25: 18:00:00 via INTRAVENOUS
  Filled 2017-03-25: qty 1000

## 2017-03-25 MED ORDER — LIDOCAINE 2% (20 MG/ML) 5 ML SYRINGE
INTRAMUSCULAR | Status: AC
  Filled 2017-03-25: qty 5

## 2017-03-25 MED ORDER — METOCLOPRAMIDE HCL 5 MG/ML IJ SOLN: 10.0000 mg | Freq: Once | INTRAMUSCULAR | Status: DC | PRN

## 2017-03-25 MED ORDER — TIZANIDINE HCL 4 MG PO TABS: 2.0000 mg | ORAL_TABLET | Freq: Three times a day (TID) | ORAL | Status: DC | PRN

## 2017-03-25 MED ORDER — BUPIVACAINE-EPINEPHRINE (PF) 0.25% -1:200000 IJ SOLN
INTRAMUSCULAR | Status: AC
  Filled 2017-03-25: qty 30

## 2017-03-25 MED ORDER — FENTANYL CITRATE (PF) 100 MCG/2ML IJ SOLN
INTRAMUSCULAR | Status: DC | PRN
  Administered 2017-03-25: 50 ug via INTRAVENOUS

## 2017-03-25 MED ORDER — CEFAZOLIN SODIUM-DEXTROSE 2-4 GM/100ML-% IV SOLN: 2.0000 g | INTRAVENOUS | Status: DC

## 2017-03-25 MED ORDER — ONDANSETRON HCL 4 MG/2ML IJ SOLN
INTRAMUSCULAR | Status: DC | PRN
  Administered 2017-03-25: 4 mg via INTRAVENOUS

## 2017-03-25 MED ORDER — ACETAMINOPHEN 10 MG/ML IV SOLN
INTRAVENOUS | Status: AC
  Filled 2017-03-25: qty 100

## 2017-03-25 MED ORDER — LACTATED RINGERS IV SOLN
INTRAVENOUS | Status: DC | PRN
  Administered 2017-03-25: 19:00:00 via INTRAVENOUS

## 2017-03-25 MED ORDER — LIDOCAINE HCL (CARDIAC) 20 MG/ML IV SOLN
INTRAVENOUS | Status: DC | PRN
  Administered 2017-03-25: 25 mg via INTRATRACHEAL
  Administered 2017-03-25: 75 mg via INTRAVENOUS

## 2017-03-25 MED ORDER — MORPHINE SULFATE (PF) 4 MG/ML IV SOLN
1.0000 mg | INTRAVENOUS | Status: DC | PRN
Start: 2017-03-25 — End: 2017-03-25
  Administered 2017-03-25: 18:00:00 4 mg via INTRAVENOUS
  Filled 2017-03-25: qty 1

## 2017-03-25 SURGICAL SUPPLY — 24 items
BLADE SURG 15 STRL LF DISP TIS (BLADE) ×1 IMPLANT
BLADE SURG 15 STRL SS (BLADE) ×2
BNDG GAUZE ELAST 4 BULKY (GAUZE/BANDAGES/DRESSINGS) IMPLANT
DECANTER SPIKE VIAL GLASS SM (MISCELLANEOUS) IMPLANT
DRAPE LAPAROSCOPIC ABDOMINAL (DRAPES) ×3 IMPLANT
DRSG PAD ABDOMINAL 8X10 ST (GAUZE/BANDAGES/DRESSINGS) IMPLANT
ELECT PENCIL ROCKER SW 15FT (MISCELLANEOUS) ×3 IMPLANT
ELECT REM PT RETURN 15FT ADLT (MISCELLANEOUS) ×3 IMPLANT
GAUZE SPONGE 4X4 12PLY STRL (GAUZE/BANDAGES/DRESSINGS) IMPLANT
GLOVE BIO SURGEON STRL SZ7.5 (GLOVE) ×18 IMPLANT
GOWN STRL REUS W/TWL LRG LVL3 (GOWN DISPOSABLE) ×6 IMPLANT
KIT BASIN OR (CUSTOM PROCEDURE TRAY) ×3 IMPLANT
NEEDLE HYPO 25X1 1.5 SAFETY (NEEDLE) ×3 IMPLANT
NS IRRIG 1000ML POUR BTL (IV SOLUTION) ×3 IMPLANT
SPONGE LAP 18X18 X RAY DECT (DISPOSABLE) ×3 IMPLANT
SUT MNCRL AB 4-0 PS2 18 (SUTURE) ×3 IMPLANT
SUT VIC AB 3-0 SH 27 (SUTURE) ×2
SUT VIC AB 3-0 SH 27XBRD (SUTURE) ×1 IMPLANT
SWAB COLLECTION DEVICE MRSA (MISCELLANEOUS) IMPLANT
SWAB CULTURE ESWAB REG 1ML (MISCELLANEOUS) IMPLANT
SYR BULB 3OZ (MISCELLANEOUS) ×3 IMPLANT
SYR CONTROL 10ML LL (SYRINGE) ×3 IMPLANT
TOWEL OR 17X26 10 PK STRL BLUE (TOWEL DISPOSABLE) ×3 IMPLANT
YANKAUER SUCT BULB TIP NO VENT (SUCTIONS) ×3 IMPLANT

## 2017-03-25 NOTE — Transfer of Care (Signed)
Immediate Anesthesia Transfer of Care Note  Patient: Keith Cunningham  Procedure(s) Performed: INCISION AND DRAINAGE ABSCESS (Right )  Patient Location: PACU  Anesthesia Type:General  Level of Consciousness: awake, alert , oriented and patient cooperative  Airway & Oxygen Therapy: Patient Spontanous Breathing and Patient connected to face mask oxygen  Post-op Assessment: Report given to RN, Post -op Vital signs reviewed and stable and Patient moving all extremities X 4  Post vital signs: stable  Last Vitals:  Vitals:   03/25/17 1731 03/25/17 2026  BP: 113/64   Pulse: 90 87  Resp: 17 12  Temp: 37.4 C 36.9 C  SpO2: 96% 100%    Last Pain:  Vitals:   03/25/17 2026  TempSrc:   PainSc: Asleep      Patients Stated Pain Goal: 5 (62/83/66 2947)  Complications: No apparent anesthesia complications

## 2017-03-25 NOTE — Anesthesia Procedure Notes (Signed)
Procedure Name: Intubation Date/Time: 03/25/2017 7:47 PM Performed by: Lissa Morales Pre-anesthesia Checklist: Patient identified, Emergency Drugs available, Suction available and Patient being monitored Patient Re-evaluated:Patient Re-evaluated prior to induction Oxygen Delivery Method: Circle system utilized Preoxygenation: Pre-oxygenation with 100% oxygen Induction Type: IV induction and Rapid sequence Laryngoscope Size: Mac and 4 Grade View: Grade II Tube type: Oral Tube size: 7.5 mm Number of attempts: 1 Airway Equipment and Method: Stylet and Oral airway Placement Confirmation: ETT inserted through vocal cords under direct vision,  positive ETCO2 and breath sounds checked- equal and bilateral Secured at: 21 cm Tube secured with: Tape Dental Injury: Teeth and Oropharynx as per pre-operative assessment

## 2017-03-25 NOTE — Anesthesia Preprocedure Evaluation (Signed)
Anesthesia Evaluation  Patient identified by MRN, date of birth, ID band Patient awake    Reviewed: Allergy & Precautions, NPO status , Patient's Chart, lab work & pertinent test results  Airway Mallampati: II  TM Distance: >3 FB Neck ROM: Full    Dental no notable dental hx.    Pulmonary neg pulmonary ROS,    Pulmonary exam normal breath sounds clear to auscultation       Cardiovascular + CAD, + Past MI and + CABG  Normal cardiovascular exam Rhythm:Regular Rate:Normal     Neuro/Psych negative neurological ROS  negative psych ROS   GI/Hepatic negative GI ROS, Neg liver ROS,   Endo/Other  negative endocrine ROS  Renal/GU negative Renal ROS  negative genitourinary   Musculoskeletal negative musculoskeletal ROS (+)   Abdominal   Peds negative pediatric ROS (+)  Hematology negative hematology ROS (+)   Anesthesia Other Findings   Reproductive/Obstetrics negative OB ROS                             Anesthesia Physical Anesthesia Plan  ASA: II  Anesthesia Plan: General   Post-op Pain Management:    Induction: Intravenous  PONV Risk Score and Plan: 2 and Ondansetron and Treatment may vary due to age or medical condition  Airway Management Planned: Oral ETT  Additional Equipment:   Intra-op Plan:   Post-operative Plan: Extubation in OR  Informed Consent: I have reviewed the patients History and Physical, chart, labs and discussed the procedure including the risks, benefits and alternatives for the proposed anesthesia with the patient or authorized representative who has indicated his/her understanding and acceptance.   Dental advisory given  Plan Discussed with: CRNA  Anesthesia Plan Comments:         Anesthesia Quick Evaluation

## 2017-03-25 NOTE — H&P (Signed)
Keith Cunningham  Location: [facility] Patient #: [id number] DOB: [birth year] Married / Language: English / Race: White Male   History of Present Illness The patient is a 76 year old male who presents with a subcutaneous abscess. We are asked to see the patient in consultation by Dr. Gaynelle Arabian to evaluate him for a gluteal abscess. The patient is a 76 year old white male who had an abscess under his right arm and on his right buttock for the last 2 weeks. He was started on oral antibiotics in the armpit area cleared up. The area on the right buttock has gotten steadily worse. He has had low-grade fevers at home. He is so tender he cannot sit on that side. He does have a history of myocardial infarction about 5 years ago but he was treated with a bypass and has had no problems since then. He did eat lunch around noontime.   Past Surgical History Bypass Surgery for Poor Blood Flow to Legs  Cataract Surgery  Bilateral. Hemorrhoidectomy  Hip Surgery  Left. Tonsillectomy   Diagnostic Studies History  Colonoscopy  >10 years ago  Allergies Statins  Codeine Phosphate *ANALGESICS - OPIOID*   Medication History  Multi Vitamin Daily (Oral) Active. Fish Oil (Oral) Specific strength unknown - Active. Aspirin (81MG  Tablet, Oral) Active. Co Q 10 (Oral) Specific strength unknown - Active. Medications Reconciled  Social History  Caffeine use  Coffee, Tea. No alcohol use  No drug use  Tobacco use  Never smoker.  Family History  Alcohol Abuse  Father. Cerebrovascular Accident  Mother. Heart Disease  Father, Mother. Heart disease in male family member before age 67  Heart disease in male family member before age 58   Other Problems  Back Pain  Chest pain  Congestive Heart Failure  Gastric Ulcer  Hemorrhoids  Melanoma  Myocardial infarction     Review of Systems  General Present- Chills, Fever and Night Sweats. Not Present-  Appetite Loss, Fatigue, Weight Gain and Weight Loss. Skin Present- New Lesions. Not Present- Change in Wart/Mole, Dryness, Hives, Jaundice, Non-Healing Wounds, Rash and Ulcer. HEENT Present- Hearing Loss and Wears glasses/contact lenses. Not Present- Earache, Hoarseness, Nose Bleed, Oral Ulcers, Ringing in the Ears, Seasonal Allergies, Sinus Pain, Sore Throat, Visual Disturbances and Yellow Eyes. Respiratory Not Present- Bloody sputum, Chronic Cough, Difficulty Breathing, Snoring and Wheezing. Breast Not Present- Breast Mass, Breast Pain, Nipple Discharge and Skin Changes. Cardiovascular Not Present- Chest Pain, Difficulty Breathing Lying Down, Leg Cramps, Palpitations, Rapid Heart Rate, Shortness of Breath and Swelling of Extremities. Gastrointestinal Not Present- Abdominal Pain, Bloating, Bloody Stool, Change in Bowel Habits, Chronic diarrhea, Constipation, Difficulty Swallowing, Excessive gas, Gets full quickly at meals, Hemorrhoids, Indigestion, Nausea, Rectal Pain and Vomiting. Male Genitourinary Not Present- Blood in Urine, Change in Urinary Stream, Frequency, Impotence, Nocturia, Painful Urination, Urgency and Urine Leakage. Musculoskeletal Present- Back Pain. Not Present- Joint Pain, Joint Stiffness, Muscle Pain, Muscle Weakness and Swelling of Extremities. Neurological Not Present- Decreased Memory, Fainting, Headaches, Numbness, Seizures, Tingling, Tremor, Trouble walking and Weakness. Psychiatric Not Present- Anxiety, Bipolar, Change in Sleep Pattern, Depression, Fearful and Frequent crying. Endocrine Not Present- Cold Intolerance, Excessive Hunger, Hair Changes, Heat Intolerance, Hot flashes and New Diabetes. Hematology Not Present- Blood Thinners, Easy Bruising, Excessive bleeding, Gland problems, HIV and Persistent Infections.  Vitals Height: 66in Temp.: 99.41F(Oral)  BP: 150/90 (Sitting, Left Arm, Standard)       Physical Exam General Mental Status-Alert. General  Appearance-Consistent with stated age. Hydration-Well hydrated. Voice-Normal.  Head and Neck Head-normocephalic, atraumatic with no lesions or palpable masses. Trachea-midline. Thyroid Gland Characteristics - normal size and consistency.  Eye Eyeball - Bilateral-Extraocular movements intact. Sclera/Conjunctiva - Bilateral-No scleral icterus.  Chest and Lung Exam Chest and lung exam reveals -quiet, even and easy respiratory effort with no use of accessory muscles and on auscultation, normal breath sounds, no adventitious sounds and normal vocal resonance. Inspection Chest Wall - Normal. Back - normal.  Cardiovascular Cardiovascular examination reveals -normal heart sounds, regular rate and rhythm with no murmurs and normal pedal pulses bilaterally.  Abdomen Inspection Inspection of the abdomen reveals - No Hernias. Skin - Scar - no surgical scars. Palpation/Percussion Palpation and Percussion of the abdomen reveal - Soft, Non Tender, No Rebound tenderness, No Rigidity (guarding) and No hepatosplenomegaly. Auscultation Auscultation of the abdomen reveals - Bowel sounds normal.  Rectal Note: There is a large tender fluctuant area of cellulitis and firmness in the right buttock   Neurologic Neurologic evaluation reveals -alert and oriented x 3 with no impairment of recent or remote memory. Mental Status-Normal.  Musculoskeletal Normal Exam - Left-Upper Extremity Strength Normal and Lower Extremity Strength Normal. Normal Exam - Right-Upper Extremity Strength Normal and Lower Extremity Strength Normal.  Lymphatic Head & Neck  General Head & Neck Lymphatics: Bilateral - Description - Normal. Axillary  General Axillary Region: Bilateral - Description - Normal. Tenderness - Non Tender. Femoral & Inguinal  Generalized Femoral & Inguinal Lymphatics: Bilateral - Description - Normal. Tenderness - Non Tender.    Assessment & Plan  ABSCESS,  GLUTEAL (L02.31) Impression: The patient has a large gluteal abscess on the right side that is exquisitely tender to palpation. I think this would best be drained in the operating room with him asleep. He agrees. I will plan to admit him to [facility] long hospital and start him on broad-spectrum antibiotic therapy. We will plan for incision and drainage of the area either tonight or first thing in the morning.

## 2017-03-25 NOTE — Op Note (Addendum)
03/25/2017  8:11 PM  PATIENT:  Keith Cunningham  76 y.o. male  PRE-OPERATIVE DIAGNOSIS:  right buttuck abscess  POST-OPERATIVE DIAGNOSIS:  right buttock, abscess  PROCEDURE:  Procedure(s): INCISION AND DRAINAGE ABSCESS (Right)  SURGEON:  Surgeon(s) and Role:    Jovita Kussmaul, MD - Primary  PHYSICIAN ASSISTANT:   ASSISTANTS: none   ANESTHESIA:   local and general  EBL:  No intake/output data recorded.  BLOOD ADMINISTERED:none  DRAINS: none   LOCAL MEDICATIONS USED:  MARCAINE     SPECIMEN:  No Specimen  DISPOSITION OF SPECIMEN:  N/A  COUNTS:  YES  TOURNIQUET:  * No tourniquets in log *  DICTATION: .Dragon Dictation    after informed consent was obtained the patient was brought to the operating room and placed in the supine position on the stretcher. After adequate induction of general anesthesia the patient was moved into a prone position on the operating room table and all pressure points are padded. The right buttock area was prepped with Betadine and draped in usual sterile manner. An appropriate timeout was performed. The abscess on the right buttock was opened sharply with the electrocautery. The cavity was probed bluntly with a finger until all loculations were broken up and the cavity was completely open. A moderate amount of pus was evacuated. Cultures were obtained. The skin around the opening was also excised sharply with electrocautery. The abscess was isolated to the skin and subcutaneous tissues. Hemostasis was then achieved using the Bovie electrocautery. The wound was then packed with a moistened 4 x 4 and sterile dressings were applied.the patient tolerated the procedure well. At the end of the case all needle sponge and instrument counts were correct. The patient was then awakened and taken to recovery in stable condition.  PLAN OF CARE: Admit to inpatient   PATIENT DISPOSITION:  PACU - hemodynamically stable.   Delay start of Pharmacological VTE agent  (>24hrs) due to surgical blood loss or risk of bleeding: not applicable

## 2017-03-25 NOTE — Interval H&P Note (Signed)
History and Physical Interval Note:  03/25/2017 7:19 PM  Keith Cunningham  has presented today for surgery, with the diagnosis of right buttuck abscess  The various methods of treatment have been discussed with the patient and family. After consideration of risks, benefits and other options for treatment, the patient has consented to  Procedure(s): INCISION AND DRAINAGE ABSCESS (Right) as a surgical intervention .  The patient's history has been reviewed, patient examined, no change in status, stable for surgery.  I have reviewed the patient's chart and labs.  Questions were answered to the patient's satisfaction.     TOTH III,Moxie Kalil S

## 2017-03-26 ENCOUNTER — Encounter (HOSPITAL_COMMUNITY): Payer: Self-pay | Admitting: General Surgery

## 2017-03-26 DIAGNOSIS — L0231 Cutaneous abscess of buttock: Secondary | ICD-10-CM | POA: Diagnosis not present

## 2017-03-26 MED ORDER — OXYCODONE HCL 5 MG PO TABS
10.0000 mg | ORAL_TABLET | ORAL | Status: DC | PRN
  Administered 2017-03-26: 21:00:00 10 mg via ORAL
  Filled 2017-03-26 (×2): qty 2

## 2017-03-26 MED ORDER — ACETAMINOPHEN 325 MG PO TABS
650.0000 mg | ORAL_TABLET | Freq: Four times a day (QID) | ORAL | Status: DC
  Administered 2017-03-26 – 2017-03-27 (×3): 650 mg via ORAL
  Filled 2017-03-26 (×4): qty 2

## 2017-03-26 MED ORDER — ONDANSETRON 4 MG PO TBDP: 4.0000 mg | ORAL_TABLET | Freq: Four times a day (QID) | ORAL | Status: DC | PRN

## 2017-03-26 MED ORDER — MENTHOL 3 MG MT LOZG
1.0000 | LOZENGE | OROMUCOSAL | Status: DC | PRN
  Administered 2017-03-26: 3 mg via ORAL
  Filled 2017-03-26: qty 9

## 2017-03-26 MED ORDER — PIPERACILLIN-TAZOBACTAM 3.375 G IVPB
3.3750 g | Freq: Three times a day (TID) | INTRAVENOUS | Status: DC
  Administered 2017-03-26 – 2017-03-27 (×5): 3.375 g via INTRAVENOUS
  Filled 2017-03-26 (×6): qty 50

## 2017-03-26 MED ORDER — ONDANSETRON HCL 4 MG/2ML IJ SOLN: 4.0000 mg | Freq: Four times a day (QID) | INTRAMUSCULAR | Status: DC | PRN

## 2017-03-26 MED ORDER — PANTOPRAZOLE SODIUM 40 MG IV SOLR: 40.0000 mg | Freq: Every day | INTRAVENOUS | Status: DC

## 2017-03-26 MED ORDER — KCL IN DEXTROSE-NACL 20-5-0.9 MEQ/L-%-% IV SOLN
INTRAVENOUS | Status: DC
  Administered 2017-03-26 (×3): via INTRAVENOUS
  Filled 2017-03-26 (×4): qty 1000

## 2017-03-26 MED ORDER — PANTOPRAZOLE SODIUM 40 MG PO TBEC
40.0000 mg | DELAYED_RELEASE_TABLET | Freq: Every day | ORAL | Status: DC
  Administered 2017-03-26: 21:00:00 40 mg via ORAL
  Filled 2017-03-26: qty 1

## 2017-03-26 MED ORDER — MORPHINE SULFATE (PF) 4 MG/ML IV SOLN
1.0000 mg | INTRAVENOUS | Status: DC | PRN
  Administered 2017-03-26: 4 mg via INTRAVENOUS
  Filled 2017-03-26: qty 1

## 2017-03-26 MED ORDER — ENOXAPARIN SODIUM 40 MG/0.4ML ~~LOC~~ SOLN
40.0000 mg | SUBCUTANEOUS | Status: DC
  Administered 2017-03-26 – 2017-03-27 (×2): 40 mg via SUBCUTANEOUS
  Filled 2017-03-26 (×2): qty 0.4

## 2017-03-26 NOTE — Progress Notes (Signed)
[facility]/Trauma Progress Note  1 Day Post-Op   Assessment/Plan  Hx CABG Hx of MI Melanoma of face CAD  R Buttock abscess - S/P I&D, 10/10, Dr. Marlou Starks - BID wet to dry dressing changes  FEN: reg diet VTE: SCD's, lovenox ID: cultures pending, Zosyn 10/10>> Foley: None Follow up: [facility] clinic in 2 weeks   DISPO: Wet to dry dressing changes, continue IV abx, PO at disharge, likely home tomorrow    LOS: 1 day    Subjective:  CC: R buttock pain  Pt states mild pain. No nausea or vomiting. No fever or chills overnight.   Objective: Vital signs in last 24 hours: Temp:  [98 F (36.7 C)-99.4 F (37.4 C)] 98 F (36.7 C) (10/11 0514) Pulse Rate:  [72-90] 73 (10/11 0514) Resp:  [12-28] 17 (10/11 0514) BP: (102-119)/(56-67) 102/60 (10/11 0514) SpO2:  [94 %-100 %] 100 % (10/11 0514) Weight:  [150 lb (68 kg)] 150 lb (68 kg) (10/10 1731) Last BM Date: 03/24/17  Intake/Output from previous day: 10/10 0701 - 10/11 0700 In: 1800 [P.O.:550; I.V.:1150; IV Piggyback:100] Out: 57 [Urine:800; Blood:5] Intake/Output this shift: Total I/O In: -  Out: 150 [Urine:150]  PE: Gen:  Alert, NAD, pleasant, cooperative Card:  RRR, no M/G/R heard Pulm:  CTA, no W/R/R, rate and effort normal Skin: warm and dry, wound on R buttock roughly 6x4cm and 3.5cm deep with no bleeding or purulent drainage, large area of surrounding erythema and induration, TTP. Repacked. Tolerated well   Anti-infectives: Anti-infectives    Start     Dose/Rate Route Frequency Ordered Stop   03/26/17 0200  piperacillin-tazobactam (ZOSYN) IVPB 3.375 g     3.375 g 12.5 mL/hr over 240 Minutes Intravenous Every 8 hours 03/26/17 0146     03/26/17 0000  ceFAZolin (ANCEF) IVPB 2g/100 mL premix  Status:  Discontinued     2 g 200 mL/hr over 30 Minutes Intravenous 60 min pre-op 03/25/17 1545 03/25/17 2113   03/25/17 1925  piperacillin-tazobactam (ZOSYN) 3.375 GM/50ML IVPB    Comments:  Enrigue Catena   :  cabinet override      03/25/17 1925 03/26/17 0204   03/25/17 1600  piperacillin-tazobactam (ZOSYN) IVPB 3.375 g  Status:  Discontinued     3.375 g 12.5 mL/hr over 240 Minutes Intravenous Every 8 hours 03/25/17 1545 03/25/17 2113      Lab Results:   Recent Labs  03/25/17 1622  WBC 11.6*  HGB 11.4*  HCT 35.6*  PLT 260   BMET  Recent Labs  03/25/17 1622  NA 139  K 3.8  CL 106  CO2 25  GLUCOSE 119*  BUN 14  CREATININE 0.85  CALCIUM 9.1   PT/INR No results for input(s): LABPROT, INR in the last 72 hours. CMP     Component Value Date/Time   NA 139 03/25/2017 1622   K 3.8 03/25/2017 1622   CL 106 03/25/2017 1622   CO2 25 03/25/2017 1622   GLUCOSE 119 (H) 03/25/2017 1622   BUN 14 03/25/2017 1622   CREATININE 0.85 03/25/2017 1622   CALCIUM 9.1 03/25/2017 1622   PROT 7.0 05/20/2016 0904   ALBUMIN 3.9 05/20/2016 0904   AST 29 05/20/2016 0904   ALT 23 05/20/2016 0904   ALKPHOS 70 05/20/2016 0904   BILITOT 0.7 05/20/2016 0904   GFRNONAA >60 03/25/2017 1622   GFRAA >60 03/25/2017 1622   Lipase  No results found for: LIPASE  Studies/Results: No results found.    Kalman Drape ,  [facility] 03/26/2017, 8:18 AM Pager: [phone] Consults: [phone] Mon-Fri 7:00 am-4:30 pm Sat-Sun 7:00 am-11:30 am

## 2017-03-26 NOTE — Progress Notes (Signed)
03/26/17 0145 Nursing Dr Marlou Starks called regarding post op orders which got discontinued in the computer upon transfer to the [facility]. Orders received for continuous Zosyn, primary ivf, and pain and nausea meds plus protonix.

## 2017-03-26 NOTE — Anesthesia Postprocedure Evaluation (Signed)
Anesthesia Post Note  Patient: Keith Cunningham  Procedure(s) Performed: INCISION AND DRAINAGE ABSCESS (Right )     Patient location during evaluation: PACU Anesthesia Type: General Level of consciousness: awake and alert Pain management: pain level controlled Vital Signs Assessment: post-procedure vital signs reviewed and stable Respiratory status: spontaneous breathing, nonlabored ventilation, respiratory function stable and patient connected to nasal cannula oxygen Cardiovascular status: blood pressure returned to baseline and stable Postop Assessment: no apparent nausea or vomiting Anesthetic complications: no    Last Vitals:  Vitals:   03/25/17 2307 03/26/17 0006  BP: (!) 109/58 108/61  Pulse: 74 79  Resp: 17 16  Temp: 37.1 C 36.9 C  SpO2: 98% 99%    Last Pain:  Vitals:   03/26/17 0006  TempSrc: Oral  PainSc:                  Montez Hageman

## 2017-03-26 NOTE — Progress Notes (Signed)
PHARMACIST - PHYSICIAN COMMUNICATION  CONCERNING: IV to Oral Route Change Policy  RECOMMENDATION: This patient is receiving Protonix by the intravenous route.  Based on criteria approved by the Pharmacy and Therapeutics Committee, the intravenous medication(s) is/are being converted to the equivalent oral dose form(s).   DESCRIPTION: These criteria include:  The patient is eating (either orally or via tube) and/or has been taking other orally administered medications for a least 24 hours  The patient has no evidence of active gastrointestinal bleeding or impaired GI absorption (gastrectomy, short bowel, patient on TNA or NPO).  If you have questions about this conversion, please contact the Pharmacy Department  []   [phone] )  [facility] []   [phone] )  [facility] []   [phone] )  [facility] []   [phone] )  [facility] [x]   [phone] )  [place], [facility] 03/26/2017 2:27 PM

## 2017-03-27 DIAGNOSIS — L0231 Cutaneous abscess of buttock: Secondary | ICD-10-CM | POA: Diagnosis not present

## 2017-03-27 MED ORDER — AMOXICILLIN-POT CLAVULANATE 875-125 MG PO TABS: 1.0000 | ORAL_TABLET | Freq: Two times a day (BID) | ORAL | 0 refills | Status: DC

## 2017-03-27 MED ORDER — OXYCODONE HCL 5 MG PO TABS: 5.0000 mg | ORAL_TABLET | ORAL | Status: DC | PRN

## 2017-03-27 MED ORDER — OXYCODONE HCL 5 MG PO TABS: 5.0000 mg | ORAL_TABLET | ORAL | 0 refills | Status: DC | PRN

## 2017-03-27 NOTE — Discharge Summary (Signed)
[facility]/Trauma Discharge Summary   Patient ID: Keith Cunningham MRN: [id number] DOB/AGE: Jul 18, 1940 76 y.o.  Admit date: 03/25/2017 Discharge date: 03/27/2017  Admitting Diagnosis: Right gluteal abscess  Discharge Diagnosis Patient Active Problem List   Diagnosis Date Noted  . Abscess of buttock 03/25/2017  . 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 Blindness of right eye 04/04/2015  . 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 Vertigo 03/23/2014  . Hyperlipidemia 03/23/2014  . 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 Abnormal stress test 05/06/2012  . Coronary atherosclerosis of native coronary artery 05/06/2012    Class: Acute  . Chest pain 04/22/2012  . Iron deficiency anemia 04/22/2012    Consultants none  Imaging: No results found.  Procedures Dr. Marlou Starks (03/25/17) - Incision and drainage of R buttock abscess  Hospital Course:  The patient is a 77 year old white male who had an abscess under his right arm and on his right buttock for the last 2 weeks. He was started on oral antibiotics in the armpit area cleared up. The area on the right buttock has gotten steadily worse which prompted him to come to the ED.  Workup showed right buttock abscess.  Patient was admitted and underwent procedure listed above.  Tolerated procedure well and was transferred to the floor.  Diet was advanced as tolerated.  On POD#2, the patient was voiding well, tolerating diet, ambulating well, pain well controlled, vital signs stable, wound stable, and felt stable for discharge home. Pt will be discharged on 7 days of Augmentin. Cultures of wound still pending. He knows that we may need to change his antibiotic. Wife at bedside and was able to do dressing change. Patient will follow up in our office in 2 weeks and knows to call with questions or concerns.  He will call to confirm appointment date/time.    Patient was discharged in good  condition.  The [place] Substance controlled database was reviewed prior to prescribing narcotic pain medication to this patient.  Physical Exam: Gen:  Alert, NAD, pleasant, cooperative Card:  RRR, no M/G/R heard Pulm:  CTA, no W/R/R, rate and effort normal Skin: warm and dry, wound on R buttock roughly 6x4cm and 3.5cm deep with no bleeding or purulent drainage, large area of surrounding erythema and induration improved, TTP. Repacked.  Allergies as of 03/27/2017      Reactions   Atorvastatin Nausea And Vomiting, Other (See Comments)   CONSTIPATION FATIGUE   Statins Other (See Comments)   Myalgias   Codeine Nausea And Vomiting      Medication List    STOP taking these medications   HYDROcodone-acetaminophen 10-325 MG tablet Commonly known as:  NORCO   oxyCODONE-acetaminophen 5-325 MG tablet Commonly known as:  PERCOCET/ROXICET     TAKE these medications   acetaminophen 500 MG tablet Commonly known as:  TYLENOL Take 500 mg by mouth every 6 (six) hours as needed for moderate pain.   amoxicillin-clavulanate 875-125 MG tablet Commonly known as:  AUGMENTIN Take 1 tablet by mouth every 12 (twelve) hours. Start taking tonight so first dose around 2000 on 10/12   aspirin EC 81 MG tablet Take 81 mg by mouth daily.   aspirin EC 325 MG tablet Take 1 tablet (325 mg total) by mouth 2 (two) times daily after a meal. Take x 1 month post op to decrease risk of blood clots.   CoQ10 200 MG Caps Take 200 mg by mouth daily.  docusate sodium 100 MG capsule Commonly known as:  COLACE Take 1 capsule (100 mg total) by mouth 2 (two) times daily.   fish oil-omega-3 fatty acids 1000 MG capsule Take 1 g by mouth 3 (three) times daily.   multivitamin with minerals Tabs tablet Take 1 tablet by mouth daily. One-A-Day   oxyCODONE 5 MG immediate release tablet Commonly known as:  Oxy IR/ROXICODONE Take 1 tablet (5 mg total) by mouth every 4 (four) hours as needed for moderate pain.  Take 30 minutes before dressing changes if needed   tiZANidine 2 MG tablet Commonly known as:  ZANAFLEX Take 1 tablet (2 mg total) by mouth every 8 (eight) hours as needed for muscle spasms.        Follow-up [facility], [place]. Call.   Specialty:  General Surgery Why:  we are working on an appointment for you for follow up. Please call our office to confirm your appointment date and time Contact information: [address] [place] [phone]          Signed: [facility] 03/27/2017, 10:09 AM Pager: [phone] Consults: [phone] Mon-Fri 7:00 am-4:30 pm Sat-Sun 7:00 am-11:30 am

## 2017-03-27 NOTE — Progress Notes (Signed)
Patient discharged to home w/ family. Given all belongings, instructions, prescriptions, drsg change equipment. Patient and wife both verbalized understanding and competency of instructions. Escorted to POV via w/c.

## 2017-03-27 NOTE — Discharge Instructions (Signed)
WOUND CARE: - dressing to be changed twice daily - supplies: sterile saline, kerlix, scissors, ABD pads, tape  - remove dressing and all packing carefully, moistening with sterile saline as needed to avoid packing/internal dressing sticking to the wound. - clean edges of skin around the wound with water/gauze, making sure there is no tape debris or leakage left on skin that could cause skin irritation or breakdown. - dampen and clean kerlix or gauze with sterile saline and pack wound from wound base to skin level, making sure to take note of any possible areas of wound tracking, tunneling and packing appropriately. Wound can be packed loosely. Trim kerlix to size if a whole kerlix is not required. - cover wound with a dry ABD pad and secure with tape.  - write the date/time on the dry dressing/tape to better track when the last dressing change occurred. - apply any skin protectant/powder recommended by clinician to protect skin/skin folds. - change dressing as needed if leakage occurs, wound gets contaminated, or patient requests to shower. - patient may shower daily with wound open and following the shower the wound should be dried and a clean dressing placed.   1. PAIN CONTROL:   1. It is helpful to take an over-the-counter pain medication regularly for the first few weeks. Choose one of the following that works best for you:  1. Naproxen (Aleve, etc) Two 220mg  tabs twice a day 2. Ibuprofen (Advil, etc) Three 200mg  tabs four times a day (every meal & bedtime) 3. Acetaminophen (Tylenol, etc) 500-650mg  four times a day (every meal & bedtime) 2. A prescription for pain medication (such as oxycodone, hydrocodone, etc) should be given to you upon discharge. Take your pain medication as prescribed.  1. If you are having problems/concerns with the prescription medicine (does not control pain, nausea, vomiting, rash, itching, etc), please call us [phone] to see if we need to switch you to a  different pain medicine that will work better for you and/or control your side effect better. 2. If you need a refill on your pain medication, please contact your pharmacy. They will contact our office to request authorization. Prescriptions will not be filled after 5 pm or on week-ends. 4. Avoid getting constipated. When taking pain medications, it is common to experience some constipation. Increasing fluid intake and taking a fiber supplement (such as Metamucil, Citrucel, FiberCon, MiraLax, etc) 1-2 times a day regularly will usually help prevent this problem from occurring. A mild laxative (prune juice, Milk of Magnesia, MiraLax, etc) should be taken according to package directions if there are no bowel movements after 48 hours.  5. Watch out for diarrhea. If you have many loose bowel movements, simplify your diet to bland foods & liquids for a few days. Stop any stool softeners and decrease your fiber supplement. Switching to mild anti-diarrheal medications (Kayopectate, Pepto Bismol) can help. If this worsens or does not improve, please call us. 6. Wash / shower every day. You may shower daily and replace your packing after showering.  7. FOLLOW UP in our office  1. Please call [facility] at [phone] to set up an appointment for a follow-up appointment approximately 2-3 weeks after discharge for wound check  WHEN TO CALL us [phone]:  1. Poor pain control 2. Reactions / problems with new medications (rash/itching, nausea, etc)  3. Fever over 101.5 F (38.5 C) 4. Worsening swelling or bruising 5. Continued bleeding from wounds. 6. Increased pain, redness, or drainage from the wounds  which could be signs of infection  The clinic staff is available to answer your questions during regular business hours (8:30am-5pm). Please dont hesitate to call and ask to speak to one of our nurses for clinical concerns.  If you have a medical emergency, go to the nearest emergency room or call 911.  A  surgeon from [facility] is always on call at the [facility], [place], [place], [place], [place] [address] ?  MAIN: [phone] ? TOLL FREE: [phone] ?  FAX [phone]  [email or web address]     Incision and Drainage, Care After Refer to this sheet in the next few weeks. These instructions provide you with information about caring for yourself after your procedure. Your health care provider may also give you more specific instructions. Your treatment has been planned according to current medical practices, but problems sometimes occur. Call your health care provider if you have any problems or questions after your procedure. What can I expect after the procedure? After the procedure, it is common to have:  Pain or discomfort around your incision site.  Drainage from your incision.  Follow these instructions at home:  Take over-the-counter and prescription medicines only as told by your health care provider.  If you were prescribed an antibiotic medicine, take it as told by your health care provider.Do not stop taking the antibiotic even if you start to feel better.  Followinstructions from your health care provider about: ? How to take care of your incision. ? When and how you should change your packing and bandage (dressing). Wash your hands with soap and water before you change your dressing. If soap and water are not available, use hand sanitizer. ? When you should remove your dressing.  Do not take baths, swim, or use a hot tub until your health care provider approves.  Keep all follow-up visits as told by your health care provider. This is important.  Check your incision area every day for signs of infection. Check for: ? More redness, swelling, or pain. ? More fluid or blood. ? Warmth. ? Pus or a bad smell. Contact a health care provider if:  Your cyst or abscess returns.  You have a  fever.  You have more redness, swelling, or pain around your incision.  You have more fluid or blood coming from your incision.  Your incision feels warm to the touch.  You have pus or a bad smell coming from your incision. Get help right away if:  You have severe pain or bleeding.  You cannot eat or drink without vomiting.  You have decreased urine output.  You become short of breath.  You have chest pain.  You cough up blood.  The area where the incision and drainage occurred becomes numb or it tingles. This information is not intended to replace advice given to you by your health care provider. Make sure you discuss any questions you have with your health care provider. Document Released: 08/25/2011 Document Revised: 11/02/2015 Document Reviewed: 03/23/2015 Elsevier Interactive Patient Education  2017 [employer].

## 2017-03-27 NOTE — Progress Notes (Signed)
Spoke with pt and wife at bedside there are no HH needs or DME at present time. Plan to discharge home.

## 2017-03-29 LAB — AEROBIC/ANAEROBIC CULTURE (SURGICAL/DEEP WOUND)

## 2017-03-29 LAB — AEROBIC/ANAEROBIC CULTURE W GRAM STAIN (SURGICAL/DEEP WOUND)

## 2017-04-02 NOTE — Progress Notes (Signed)
Cardiology Office Note    Date:  04/03/2017   ID:  Keith Cunningham, DOB 1940/11/10, MRN [id number]  PCP:  Aurea Graff.Marlou Sa, MD  Cardiologist: Sinclair Grooms, MD   Chief Complaint  Patient presents with  . Coronary Artery Disease    History of Present Illness:  Keith Cunningham is a 76 y.o. male who presents for coronary artery disease with prior sequential SVG to OM1 and OM 2, LIMA to LAD, and SVG to PDA in 2013, hyperlipidemia,  Patient had a buttocks abscess that eventually needed to be treated with surgical drainage. He was placed on antibiotics. He is improving.  No cardiac complaints. He specifically denies chest pain, orthopnea, PND, and claudication.   Past Medical History:  Diagnosis Date  . Anemia   . Anginal pain ([place]) 04/21/2012  . Arthritis   . Bleeding internal hemorrhoids 2012  . Coronary artery disease   . H/O hiatal hernia   . History of blood transfusion 2012   "related to bleeding hemorrhoids" (05/06/2012)  . Hyperlipidemia   . 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 Myocardial infarction ([place]) 04/21/2012  . Pneumonia    "as a child" (05/06/2012)  . 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     Past Surgical History:  Procedure Laterality Date  . CARDIAC CATHETERIZATION  04/21/2012  . CORONARY ARTERY BYPASS GRAFT  05/10/2012   Procedure: CORONARY ARTERY BYPASS GRAFTING (CABG);  Surgeon: Grace Isaac, MD;  Location: [facility];  Service: Open Heart Surgery;  Laterality: N/A;  times three using Left Greater Saphenous Vein Graft harvested endoscopically and Left Internal Mammary Artery  . EXCISIONAL HEMORRHOIDECTOMY  2012   "tried to shrink 1st w/needle; cut them out 2 wk later" (05/06/2012)  . EYE SURGERY Bilateral    cataracts  . INCISION AND DRAINAGE ABSCESS Right 03/25/2017   Procedure: INCISION AND DRAINAGE ABSCESS;  Surgeon:  Jovita Kussmaul, MD;  Location: [facility];  Service: General;  Laterality: Right;  . INTRAOPERATIVE TRANSESOPHAGEAL ECHOCARDIOGRAM  05/10/2012   Procedure: INTRAOPERATIVE TRANSESOPHAGEAL ECHOCARDIOGRAM;  Surgeon: Grace Isaac, MD;  Location: [facility];  Service: Open Heart Surgery;  Laterality: N/A;  . MELANOMA EXCISION  2009   "left side of head" (05/06/2012)  . TONSILLECTOMY AND ADENOIDECTOMY  ~ 1950  . TOTAL HIP ARTHROPLASTY Left 05/26/2016   Procedure: TOTAL HIP ARTHROPLASTY ANTERIOR APPROACH;  Surgeon: Dorna Leitz, MD;  Location: [facility];  Service: Orthopedics;  Laterality: Left;    Current Medications: Outpatient Medications Prior to Visit  Medication Sig Dispense Refill  . acetaminophen (TYLENOL) 500 MG tablet Take 500 mg by mouth every 6 (six) hours as needed for moderate pain.    Marland Kitchen aspirin EC 325 MG tablet Take 1 tablet (325 mg total) by mouth 2 (two) times daily after a meal. Take x 1 month post op to decrease risk of blood clots. 60 tablet 0  . Coenzyme Q10 (COQ10) 200 MG CAPS Take 200 mg by mouth daily.    . fish oil-omega-3 fatty acids 1000 MG capsule Take 1 g by mouth 3 (three) times daily.     . Multiple Vitamin (MULTIVITAMIN WITH MINERALS) TABS tablet Take 1 tablet by mouth daily. One-A-Day    . amoxicillin-clavulanate (AUGMENTIN) 875-125 MG tablet Take 1 tablet by mouth every 12 (twelve) hours. Start taking tonight so first dose  around 2000 on 10/12 14 tablet 0  . aspirin EC 81 MG tablet Take 81 mg by mouth daily.    Marland Kitchen docusate sodium (COLACE) 100 MG capsule Take 1 capsule (100 mg total) by mouth 2 (two) times daily. 30 capsule 0  . oxyCODONE (OXY IR/ROXICODONE) 5 MG immediate release tablet Take 1 tablet (5 mg total) by mouth every 4 (four) hours as needed for moderate pain. Take 30 minutes before dressing changes if needed 15 tablet 0  . tiZANidine (ZANAFLEX) 2 MG tablet Take 1 tablet (2 mg total) by mouth every 8 (eight) hours as needed for muscle spasms. 50 tablet 0   No  facility-administered medications prior to visit.      Allergies:   Atorvastatin; Statins; and Codeine   Social History   Social History  . Marital status: Married    Spouse name: N/A  . Number of children: N/A  . Years of education: N/A   Social History Main Topics  . Smoking status: Never Smoker  . Smokeless tobacco: Never Used  . Alcohol use Yes     Comment: 05/06/2012 "used to drink back years ago; nothing in last 25 yrs"  . Drug use: No  . Sexual activity: Yes   Other Topics Concern  . None   Social History Narrative  . None     Family History:  The patient's family history includes Diabetes Mellitus II in his mother.   ROS:   Please see the history of present illness.    Buttocks abscess recently treated with incision and drainage.  All other systems reviewed and are negative.   PHYSICAL EXAM:   VS:  BP 124/70 (BP Location: Left Arm)   Pulse 69   Ht 5\' 6"  (1.676 m)   Wt 151 lb (68.5 kg)   BMI 24.37 kg/m    GEN: Well nourished, well developed, in no acute distress  HEENT: normal  Neck: no JVD, carotid bruits, or masses Cardiac: RRR; no murmurs, rubs, or gallops,no edema  Respiratory:  clear to auscultation bilaterally, normal work of breathing GI: soft, nontender, nondistended, + BS MS: no deformity or atrophy  Skin: warm and dry, no rash Neuro:  Alert and Oriented x 3, Strength and sensation are intact Psych: euthymic mood, full affect  Wt Readings from Last 3 Encounters:  04/03/17 151 lb (68.5 kg)  03/25/17 150 lb (68 kg)  05/26/16 155 lb 13.8 oz (70.7 kg)      Studies/Labs Reviewed:   EKG:  EKG  Normal sinus rhythm, incomplete right bundle branch block, relatively low voltage on electrocardiogram performed on 03/25/17.unchanged when compared to 2017.  Recent Labs: 05/20/2016: ALT 23 03/25/2017: BUN 14; Creatinine, Ser 0.85; Hemoglobin 11.4; Platelets 260; Potassium 3.8; Sodium 139   Lipid Panel    Component Value Date/Time   CHOL 191  04/04/2015 0908   TRIG 133 04/04/2015 0908   HDL 52 04/04/2015 0908   CHOLHDL 3.7 04/04/2015 0908   VLDL 27 04/04/2015 0908   LDLCALC 112 04/04/2015 0908    Additional studies/ records that were reviewed today include:  None    ASSESSMENT:    1. Atherosclerosis of native coronary artery of native heart with stable angina pectoris ([place])   2. Other hyperlipidemia   3. Elevated hemoglobin A1c   4. Abnormal stress test      PLAN:  In order of problems listed above:  1. He is asymptomatic. He is status post coronary bypass surgery. He refuses management of lipids. I  have encouraged aerobic activity. 2. Low fat diet and aerobic activity. 3. Glycemic control with low carbohydrate diet.  Clinical follow-up in one year. Aerobic activity.  Medication Adjustments/Labs and Tests Ordered: Current medicines are reviewed at length with the patient today.  Concerns regarding medicines are outlined above.  Medication changes, Labs and Tests ordered today are listed in the Patient Instructions below. Patient Instructions  Medication Instructions:  Your physician recommends that you continue on your current medications as directed. Please refer to the Current Medication list given to you today.  DECREASE: aspirin from 325mg  to 81 mg daily after approval from your surgeon.   Labwork: None ordered  Testing/Procedures: None ordered   Follow-Up: Your physician wants you to follow-up in: 1 year with Dr. Tamala Julian. You will receive a reminder letter in the mail two months in advance. If you don't receive a letter, please call our office to schedule the follow-up appointment.   Any Other Special Instructions Will Be Listed Below (If Applicable).   Heart-Healthy Eating Plan Heart-healthy meal planning includes:  Limiting unhealthy fats.  Increasing healthy fats.  Making other small dietary changes.  You may need to talk with your doctor or a diet specialist (dietitian) to create an  eating plan that is right for you. What types of fat should I choose?  Choose healthy fats. These include olive oil and canola oil, flaxseeds, walnuts, almonds, and seeds.  Eat more omega-3 fats. These include salmon, mackerel, sardines, tuna, flaxseed oil, and ground flaxseeds. Try to eat fish at least twice each week.  Limit saturated fats. ? Saturated fats are often found in animal products, such as meats, butter, and cream. ? Plant sources of saturated fats include palm oil, palm kernel oil, and coconut oil.  Avoid foods with partially hydrogenated oils in them. These include stick margarine, some tub margarines, cookies, crackers, and other baked goods. These contain trans fats. What general guidelines do I need to follow?  Check food labels carefully. Identify foods with trans fats or high amounts of saturated fat.  Fill one half of your plate with vegetables and green salads. Eat 4-5 servings of vegetables per day. A serving of vegetables is: ? 1 cup of raw leafy vegetables. ?  cup of raw or cooked cut-up vegetables. ?  cup of vegetable juice.  Fill one fourth of your plate with whole grains. Look for the word "whole" as the first word in the ingredient list.  Fill one fourth of your plate with lean protein foods.  Eat 4-5 servings of fruit per day. A serving of fruit is: ? One medium whole fruit. ?  cup of dried fruit. ?  cup of fresh, frozen, or canned fruit. ?  cup of 100% fruit juice.  Eat more foods that contain soluble fiber. These include apples, broccoli, carrots, beans, peas, and barley. Try to get 20-30 g of fiber per day.  Eat more home-cooked food. Eat less restaurant, buffet, and fast food.  Limit or avoid alcohol.  Limit foods high in starch and sugar.  Avoid fried foods.  Avoid frying your food. Try baking, boiling, grilling, or broiling it instead. You can also reduce fat by: ? Removing the skin from poultry. ? Removing all visible fats from  meats. ? Skimming the fat off of stews, soups, and gravies before serving them. ? Steaming vegetables in water or broth.  Lose weight if you are overweight.  Eat 4-5 servings of nuts, legumes, and seeds per week: ? One serving  of dried beans or legumes equals  cup after being cooked. ? One serving of nuts equals 1 ounces. ? One serving of seeds equals  ounce or one tablespoon.  You may need to keep track of how much salt or sodium you eat. This is especially true if you have high blood pressure. Talk with your doctor or dietitian to get more information. What foods can I eat? Grains Breads, including [place], white, pita, wheat, raisin, rye, oatmeal, and [place]. Tortillas that are neither fried nor made with lard or trans fat. Low-fat rolls, including hotdog and hamburger buns and English muffins. Biscuits. Muffins. Waffles. Pancakes. Light popcorn. Whole-grain cereals. Flatbread. Melba toast. Pretzels. Breadsticks. Rusks. Low-fat snacks. Low-fat crackers, including oyster, saltine, matzo, graham, animal, and rye. Rice and pasta, including brown rice and pastas that are made with whole wheat. Vegetables All vegetables. Fruits All fruits, but limit coconut. Meats and Other Protein Sources Lean, well-trimmed beef, veal, pork, and lamb. Chicken and [place] without skin. All fish and shellfish. Wild duck, rabbit, pheasant, and venison. Egg whites or low-cholesterol egg substitutes. Dried beans, peas, lentils, and tofu. Seeds and most nuts. Dairy Low-fat or nonfat cheeses, including ricotta, string, and mozzarella. Skim or 1% milk that is liquid, powdered, or evaporated. Buttermilk that is made with low-fat milk. Nonfat or low-fat yogurt. Beverages Mineral water. Diet carbonated beverages. Sweets and Desserts Sherbets and fruit ices. Honey, jam, marmalade, jelly, and syrups. Meringues and gelatins. Pure sugar candy, such as hard candy, jelly beans, gumdrops, mints, marshmallows, and small  amounts of dark chocolate. [employer] Eat all sweets and desserts in moderation. Fats and Oils Nonhydrogenated (trans-free) margarines. Vegetable oils, including soybean, sesame, sunflower, olive, peanut, safflower, corn, canola, and cottonseed. Salad dressings or mayonnaise made with a vegetable oil. Limit added fats and oils that you use for cooking, baking, salads, and as spreads. Other Cocoa powder. Coffee and tea. All seasonings and condiments. The items listed above may not be a complete list of recommended foods or beverages. Contact your dietitian for more options. What foods are not recommended? Grains Breads that are made with saturated or trans fats, oils, or whole milk. Croissants. Butter rolls. Cheese breads. Sweet rolls. Donuts. Buttered popcorn. Chow mein noodles. High-fat crackers, such as cheese or butter crackers. Meats and Other Protein Sources Fatty meats, such as hotdogs, short ribs, sausage, spareribs, bacon, rib eye roast or steak, and mutton. High-fat deli meats, such as salami and bologna. Caviar. Domestic duck and goose. Organ meats, such as kidney, liver, sweetbreads, and heart. Dairy Cream, sour cream, cream cheese, and creamed cottage cheese. Whole-milk cheeses, including blue (bleu), Monte[place] Jack, [place], [place], [place], [place], Swiss, cheddar, [place], and [place]. Whole or 2% milk that is liquid, evaporated, or condensed. Whole buttermilk. Cream sauce or high-fat cheese sauce. Yogurt that is made from whole milk. Beverages Regular sodas and juice drinks with added sugar. Sweets and Desserts Frosting. Pudding. Cookies. Cakes other than angel food cake. Candy that has milk chocolate or white chocolate, hydrogenated fat, butter, coconut, or unknown ingredients. Buttered syrups. Full-fat ice cream or ice cream drinks. Fats and Oils Gravy that has suet, meat fat, or shortening. Cocoa butter, hydrogenated oils, palm oil, coconut oil, palm kernel oil. These can  often be found in baked products, candy, fried foods, nondairy creamers, and whipped toppings. Solid fats and shortenings, including bacon fat, salt pork, lard, and butter. Nondairy cream substitutes, such as coffee creamers and sour cream substitutes. Salad dressings that are made of  unknown oils, cheese, or sour cream. The items listed above may not be a complete list of foods and beverages to avoid. Contact your dietitian for more information. This information is not intended to replace advice given to you by your health care provider. Make sure you discuss any questions you have with your health care provider. Document Released: 12/02/2011 Document Revised: 11/08/2015 Document Reviewed: 11/24/2013 Elsevier Interactive Patient Education  [employer].     If you need a refill on your cardiac medications before your next appointment, please call your pharmacy.     Signed, Sinclair Grooms, MD  04/03/2017 9:34 AM    [facility] [address], [place], [place]  [address] Phone: [phone]; Fax: [phone]

## 2017-04-03 ENCOUNTER — Ambulatory Visit (INDEPENDENT_AMBULATORY_CARE_PROVIDER_SITE_OTHER): Payer: Medicare Other | Admitting: Interventional Cardiology

## 2017-04-03 ENCOUNTER — Encounter: Payer: Self-pay | Admitting: Interventional Cardiology

## 2017-04-03 VITALS — BP 124/70 | HR 69 | Ht 66.0 in | Wt 151.0 lb

## 2017-04-03 DIAGNOSIS — R9439 Abnormal result of other cardiovascular function study: Secondary | ICD-10-CM | POA: Diagnosis not present

## 2017-04-03 DIAGNOSIS — E7849 Other hyperlipidemia: Secondary | ICD-10-CM

## 2017-04-03 DIAGNOSIS — R7309 Other abnormal glucose: Secondary | ICD-10-CM

## 2017-04-03 DIAGNOSIS — I25118 Atherosclerotic heart disease of native coronary artery with other forms of angina pectoris: Secondary | ICD-10-CM | POA: Diagnosis not present

## 2017-04-03 NOTE — Patient Instructions (Signed)
Medication Instructions:  Your physician recommends that you continue on your current medications as directed. Please refer to the Current Medication list given to you today.  DECREASE: aspirin from 325mg  to 81 mg daily after approval from your surgeon.   Labwork: None ordered  Testing/Procedures: None ordered   Follow-Up: Your physician wants you to follow-up in: 1 year with Dr. Tamala Julian. You will receive a reminder letter in the mail two months in advance. If you don't receive a letter, please call our office to schedule the follow-up appointment.   Any Other Special Instructions Will Be Listed Below (If Applicable).   Heart-Healthy Eating Plan Heart-healthy meal planning includes:  Limiting unhealthy fats.  Increasing healthy fats.  Making other small dietary changes.  You may need to talk with your doctor or a diet specialist (dietitian) to create an eating plan that is right for you. What types of fat should I choose?  Choose healthy fats. These include olive oil and canola oil, flaxseeds, walnuts, almonds, and seeds.  Eat more omega-3 fats. These include salmon, mackerel, sardines, tuna, flaxseed oil, and ground flaxseeds. Try to eat fish at least twice each week.  Limit saturated fats. ? Saturated fats are often found in animal products, such as meats, butter, and cream. ? Plant sources of saturated fats include palm oil, palm kernel oil, and coconut oil.  Avoid foods with partially hydrogenated oils in them. These include stick margarine, some tub margarines, cookies, crackers, and other baked goods. These contain trans fats. What general guidelines do I need to follow?  Check food labels carefully. Identify foods with trans fats or high amounts of saturated fat.  Fill one half of your plate with vegetables and green salads. Eat 4-5 servings of vegetables per day. A serving of vegetables is: ? 1 cup of raw leafy vegetables. ?  cup of raw or cooked cut-up  vegetables. ?  cup of vegetable juice.  Fill one fourth of your plate with whole grains. Look for the word "whole" as the first word in the ingredient list.  Fill one fourth of your plate with lean protein foods.  Eat 4-5 servings of fruit per day. A serving of fruit is: ? One medium whole fruit. ?  cup of dried fruit. ?  cup of fresh, frozen, or canned fruit. ?  cup of 100% fruit juice.  Eat more foods that contain soluble fiber. These include apples, broccoli, carrots, beans, peas, and barley. Try to get 20-30 g of fiber per day.  Eat more home-cooked food. Eat less restaurant, buffet, and fast food.  Limit or avoid alcohol.  Limit foods high in starch and sugar.  Avoid fried foods.  Avoid frying your food. Try baking, boiling, grilling, or broiling it instead. You can also reduce fat by: ? Removing the skin from poultry. ? Removing all visible fats from meats. ? Skimming the fat off of stews, soups, and gravies before serving them. ? Steaming vegetables in water or broth.  Lose weight if you are overweight.  Eat 4-5 servings of nuts, legumes, and seeds per week: ? One serving of dried beans or legumes equals  cup after being cooked. ? One serving of nuts equals 1 ounces. ? One serving of seeds equals  ounce or one tablespoon.  You may need to keep track of how much salt or sodium you eat. This is especially true if you have high blood pressure. Talk with your doctor or dietitian to get more information. What foods can  I eat? Grains Breads, including [place], white, pita, wheat, raisin, rye, oatmeal, and [place]. Tortillas that are neither fried nor made with lard or trans fat. Low-fat rolls, including hotdog and hamburger buns and English muffins. Biscuits. Muffins. Waffles. Pancakes. Light popcorn. Whole-grain cereals. Flatbread. Melba toast. Pretzels. Breadsticks. Rusks. Low-fat snacks. Low-fat crackers, including oyster, saltine, matzo, graham, animal, and rye. Rice  and pasta, including brown rice and pastas that are made with whole wheat. Vegetables All vegetables. Fruits All fruits, but limit coconut. Meats and Other Protein Sources Lean, well-trimmed beef, veal, pork, and lamb. Chicken and [place] without skin. All fish and shellfish. Wild duck, rabbit, pheasant, and venison. Egg whites or low-cholesterol egg substitutes. Dried beans, peas, lentils, and tofu. Seeds and most nuts. Dairy Low-fat or nonfat cheeses, including ricotta, string, and mozzarella. Skim or 1% milk that is liquid, powdered, or evaporated. Buttermilk that is made with low-fat milk. Nonfat or low-fat yogurt. Beverages Mineral water. Diet carbonated beverages. Sweets and Desserts Sherbets and fruit ices. Honey, jam, marmalade, jelly, and syrups. Meringues and gelatins. Pure sugar candy, such as hard candy, jelly beans, gumdrops, mints, marshmallows, and small amounts of dark chocolate. [employer] Eat all sweets and desserts in moderation. Fats and Oils Nonhydrogenated (trans-free) margarines. Vegetable oils, including soybean, sesame, sunflower, olive, peanut, safflower, corn, canola, and cottonseed. Salad dressings or mayonnaise made with a vegetable oil. Limit added fats and oils that you use for cooking, baking, salads, and as spreads. Other Cocoa powder. Coffee and tea. All seasonings and condiments. The items listed above may not be a complete list of recommended foods or beverages. Contact your dietitian for more options. What foods are not recommended? Grains Breads that are made with saturated or trans fats, oils, or whole milk. Croissants. Butter rolls. Cheese breads. Sweet rolls. Donuts. Buttered popcorn. Chow mein noodles. High-fat crackers, such as cheese or butter crackers. Meats and Other Protein Sources Fatty meats, such as hotdogs, short ribs, sausage, spareribs, bacon, rib eye roast or steak, and mutton. High-fat deli meats, such as salami and bologna. Caviar.  Domestic duck and goose. Organ meats, such as kidney, liver, sweetbreads, and heart. Dairy Cream, sour cream, cream cheese, and creamed cottage cheese. Whole-milk cheeses, including blue (bleu), Monte[place] Jack, [place], [place], American, [place], Swiss, cheddar, [place], and [place]. Whole or 2% milk that is liquid, evaporated, or condensed. Whole buttermilk. Cream sauce or high-fat cheese sauce. Yogurt that is made from whole milk. Beverages Regular sodas and juice drinks with added sugar. Sweets and Desserts Frosting. Pudding. Cookies. Cakes other than angel food cake. Candy that has milk chocolate or white chocolate, hydrogenated fat, butter, coconut, or unknown ingredients. Buttered syrups. Full-fat ice cream or ice cream drinks. Fats and Oils Gravy that has suet, meat fat, or shortening. Cocoa butter, hydrogenated oils, palm oil, coconut oil, palm kernel oil. These can often be found in baked products, candy, fried foods, nondairy creamers, and whipped toppings. Solid fats and shortenings, including bacon fat, salt pork, lard, and butter. Nondairy cream substitutes, such as coffee creamers and sour cream substitutes. Salad dressings that are made of unknown oils, cheese, or sour cream. The items listed above may not be a complete list of foods and beverages to avoid. Contact your dietitian for more information. This information is not intended to replace advice given to you by your health care provider. Make sure you discuss any questions you have with your health care provider. Document Released: 12/02/2011 Document Revised: 11/08/2015 Document Reviewed: 11/24/2013 Elsevier Interactive  Patient Education  [employer].     If you need a refill on your cardiac medications before your next appointment, please call your pharmacy.

## 2017-06-30 ENCOUNTER — Encounter: Payer: Self-pay | Admitting: Neurology

## 2017-07-07 NOTE — Progress Notes (Signed)
Keith Cunningham was seen today in the movement disorders clinic for neurologic consultation at the request of Alroy Dust, L.Marlou Sa, MD.  The consultation is for the evaluation of tremor.  This patient is accompanied in the office by his spouse who supplements the history.  Tremor: Yes.     How long has it been going on? A little tremor in the fingers since his 20's but worse now and feels tremor on the inside now.  Had MI in 2013 and woke up with inability to use the L arm and lost hearing but MRI x 4 were negative and told no stroke.  Tremors worse after MI  At rest or with activation?  Both but most when reaching for something  When is it noted the most?  Feeding/drinking  Fam hx of tremor?  Yes.  , dad did "his whole life"  Affected by caffeine:  No. (3 days per week drinks coffee; drinks tea with each meal)  Affected by alcohol:  Doesn't drink EtOH  Affected by stress:  "I'm not stressed"  Affected by fatigue:  Yes.    Spills soup if on spoon:  Yes.    Spills glass of liquid if full:  Yes.    Affects ADL's (tying shoes, brushing teeth, etc):  No.    Specific Symptoms:  Voice: gotten weaker Sleep: always had trouble sleeping  Vivid Dreams:  No.  Acting out dreams:  No. Wet Pillows: No. Postural symptoms:  Yes.  , states that this is due to vertigo which started also after his MI  Falls?  No. Bradykinesia symptoms: difficulty getting out of a chair (attributes some to hip replacement on the L); shorter stride length Loss of smell:  No. Loss of taste:  No. Urinary Incontinence:  No. Difficulty Swallowing:  Yes.   (intermittent) Handwriting, micrographia: Yes.   Trouble with ADL's:  No.  Trouble buttoning clothing: unknown - doesn't wear such shirts Depression:  No. Memory changes:  Yes.  , minor short term Hallucinations:  No.  visual distortions: No. N/V:  No. Lightheaded:  Yes.  , some with vertigo  Syncope: No. Diplopia:  No. Dyskinesia:  No.   I personally reviewed  the patient's 2014 MRI of the brain.  There was moderate small vessel disease.  PREVIOUS MEDICATIONS: none to date  ALLERGIES:   Allergies  Allergen Reactions  . Atorvastatin Nausea And Vomiting and Other (See Comments)    CONSTIPATION FATIGUE  . Statins Other (See Comments)    Myalgias   . Codeine Nausea And Vomiting    CURRENT MEDICATIONS:  Outpatient Encounter Medications as of 07/09/2017  Medication Sig  . acetaminophen (TYLENOL) 500 MG tablet Take 500 mg by mouth every 6 (six) hours as needed for moderate pain.  Marland Kitchen aspirin EC 325 MG tablet Take 1 tablet (325 mg total) by mouth 2 (two) times daily after a meal. Take x 1 month post op to decrease risk of blood clots.  . Coenzyme Q10 (COQ10) 200 MG CAPS Take 200 mg by mouth daily.  . fish oil-omega-3 fatty acids 1000 MG capsule Take 1 g by mouth 3 (three) times daily.   . Multiple Vitamin (MULTIVITAMIN WITH MINERALS) TABS tablet Take 1 tablet by mouth daily. One-A-Day   No facility-administered encounter medications on file as of 07/09/2017.     PAST MEDICAL HISTORY:   Past Medical History:  Diagnosis Date  . Anemia   . Anginal pain ([place]) 04/21/2012  . Arthritis   . Bleeding  internal hemorrhoids 2012  . Coronary artery disease   . H/O hiatal hernia   . History of blood transfusion 2012   "related to bleeding hemorrhoids" (05/06/2012)  . Hyperlipidemia   . 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 Myocardial infarction ([place]) 04/21/2012  . Pneumonia    "as a child" (05/06/2012)  . 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     PAST SURGICAL HISTORY:   Past Surgical History:  Procedure Laterality Date  . CARDIAC CATHETERIZATION  04/21/2012  . CORONARY ARTERY BYPASS GRAFT  05/10/2012   Procedure: CORONARY ARTERY BYPASS GRAFTING (CABG);  Surgeon: Grace Isaac, MD;  Location: [facility];  Service: Open Heart  Surgery;  Laterality: N/A;  times three using Left Greater Saphenous Vein Graft harvested endoscopically and Left Internal Mammary Artery  . EXCISIONAL HEMORRHOIDECTOMY  2012   "tried to shrink 1st w/needle; cut them out 2 wk later" (05/06/2012)  . EYE SURGERY Bilateral    cataracts  . INCISION AND DRAINAGE ABSCESS Right 03/25/2017   Procedure: INCISION AND DRAINAGE ABSCESS;  Surgeon: Jovita Kussmaul, MD;  Location: [facility];  Service: General;  Laterality: Right;  . INTRAOPERATIVE TRANSESOPHAGEAL ECHOCARDIOGRAM  05/10/2012   Procedure: INTRAOPERATIVE TRANSESOPHAGEAL ECHOCARDIOGRAM;  Surgeon: Grace Isaac, MD;  Location: [facility];  Service: Open Heart Surgery;  Laterality: N/A;  . MELANOMA EXCISION  2009   "left side of head" (05/06/2012)  . TONSILLECTOMY AND ADENOIDECTOMY  ~ 1950  . TOTAL HIP ARTHROPLASTY Left 05/26/2016   Procedure: TOTAL HIP ARTHROPLASTY ANTERIOR APPROACH;  Surgeon: Dorna Leitz, MD;  Location: [facility];  Service: Orthopedics;  Laterality: Left;    SOCIAL HISTORY:   Social History   Socioeconomic History  . Marital status: Married    Spouse name: Not on file  . Number of children: Not on file  . Years of education: Not on file  . Highest education level: Not on file  Social Needs  . Financial resource strain: Not on file  . Food insecurity - worry: Not on file  . Food insecurity - inability: Not on file  . Transportation needs - medical: Not on file  . Transportation needs - non-medical: Not on file  Occupational History  . Not on file  Tobacco Use  . Smoking status: Never Smoker  . Smokeless tobacco: Never Used  Substance and Sexual Activity  . Alcohol use: No    Frequency: Never  . Drug use: No  . Sexual activity: Yes  Other Topics Concern  . Not on file  Social History Narrative  . Not on file    FAMILY HISTORY:   Family Status  Relation Name Status  . Mother  Deceased  . Father  Deceased  . Sister  Alive  . Son  Alive  . Brother unknown med hx  Other  . Brother half Alive    ROS:  A complete 10 system review of systems was obtained and was unremarkable apart from what is mentioned above.  PHYSICAL EXAMINATION:    VITALS:   Vitals:   07/09/17 0835  BP: 114/78  Pulse: 98  SpO2: 98%  Weight: 152 lb (68.9 kg)  Height: 5\' 6"  (1.676 m)    GEN:  The patient appears stated age and is in NAD. HEENT:  Normocephalic, atraumatic.  The mucous membranes are moist. The superficial temporal arteries are  without ropiness or tenderness. CV:  RRR Lungs:  CTAB Neck/HEME:  There are no carotid bruits bilaterally.  Neurological examination:  Orientation: The patient is alert and oriented x3. Fund of knowledge is appropriate.  Recent and remote memory are intact.  Attention and concentration are normal.    Able to name objects and repeat phrases. Cranial nerves: There is good facial symmetry. Pupils are equal round and reactive to light bilaterally. Fundoscopic exam reveals clear margins bilaterally. Extraocular muscles are intact. The visual fields are full to confrontational testing. The speech is fluent and clear. Soft palate rises symmetrically and there is no tongue deviation. Hearing is intact to conversational tone. Sensation: Sensation is intact to light and pinprick throughout (facial, trunk, extremities). Vibration is intact at the bilateral big toe. There is no extinction with double simultaneous stimulation. There is no sensory dermatomal level identified. Motor: Strength is 5/5 in the bilateral upper and lower extremities.   Shoulder shrug is equal and symmetric.  There is no pronator drift. Deep tendon reflexes: Deep tendon reflexes are 2/4 at the bilateral biceps, triceps, brachioradialis, patella and achilles. Plantar responses are downgoing bilaterally.  Movement examination: Tone: There is normal tone in the bilateral upper extremities.  The tone in the lower extremities is normal.  Abnormal movements: there is a mild rest  tremor on the right.  He has mild tremor of the outstretched hands that becomes moderate with intention.  Holding a weight does not make tremor worse and actually may improve it.  He has trouble with Archimedes spirals bilaterally, right worse than left.  He has some trouble pouring water from one glass to another, but does not spill a lot. Coordination:  There is no decremation with RAM's, but he is slow because of tremor.   Gait and Station: The patient has mild difficulty arising out of a deep-seated chair without the use of the hands and is able to arise on the second attempt. The patient's stride length is normal with a wide-based gait.    Patient's primary care physician sent me lab work dated September 01, 2016.  White blood cells are 7.8, hemoglobin 13.1, hematocrit 40.3 and platelets 276.  RPR was negative. Lab Results  Component Value Date   TSH 3.011 04/22/2012     Chemistry      Component Value Date/Time   NA 139 03/25/2017 1622   K 3.8 03/25/2017 1622   CL 106 03/25/2017 1622   CO2 25 03/25/2017 1622   BUN 14 03/25/2017 1622   CREATININE 0.85 03/25/2017 1622      Component Value Date/Time   CALCIUM 9.1 03/25/2017 1622   ALKPHOS 70 05/20/2016 0904   AST 29 05/20/2016 0904   ALT 23 05/20/2016 0904   BILITOT 0.7 05/20/2016 0904        ASSESSMENT/PLAN:  1.  Essential Tremor.  -This is evidenced by the symmetrical nature and longstanding hx of gradually getting worse.  We discussed nature and pathophysiology.  We discussed that this can continue to gradually get worse with time.  We discussed that some medications can worsen this, as can caffeine use.  We discussed medication therapy as well as surgical therapy.  Ultimately, the patient decided to try primidone.  He did warn me that he generally tolerates no medication at all.  We will start this medication very low dose.  He will take 25 mg at night for 4 nights and then increase to 50 mg at night.  This is still an incredibly  low dose.  Risks, benefits, side effects and alternative therapies were discussed.  The opportunity to ask questions was given and they were answered to the best of my ability.  The patient expressed understanding and willingness to follow the outlined treatment protocols.  He did ask about surgical therapies and we discussed those but I don't recommend that right now.  -will check TSH  -reassured him that I don't see PD, at least right now.  Told him that this could develop in the future but he doesn't have it now   -He asked me if essential tremor was related to his vertigo.  I discussed with him that it is not.  He has me if the treatment would help his vertigo and I told him it would not.  2.  I will plan on seeing the patient back in the next few months, sooner should new neurologic issues arise.  Much greater than 50% of this visit was spent in counseling and coordinating care.  Total face to face time:  60 min   Cc:  Mitchell, L.Marlou Sa, MD

## 2017-07-09 ENCOUNTER — Encounter: Payer: Self-pay | Admitting: Neurology

## 2017-07-09 ENCOUNTER — Ambulatory Visit: Payer: Medicare Other | Admitting: Neurology

## 2017-07-09 ENCOUNTER — Other Ambulatory Visit: Payer: Medicare Other

## 2017-07-09 VITALS — BP 114/78 | HR 98 | Ht 66.0 in | Wt 152.0 lb

## 2017-07-09 DIAGNOSIS — G250 Essential tremor: Secondary | ICD-10-CM

## 2017-07-09 DIAGNOSIS — R42 Dizziness and giddiness: Secondary | ICD-10-CM | POA: Diagnosis not present

## 2017-07-09 DIAGNOSIS — R251 Tremor, unspecified: Secondary | ICD-10-CM

## 2017-07-09 LAB — TSH: TSH: 2.54 mIU/L (ref 0.40–4.50)

## 2017-07-09 MED ORDER — PRIMIDONE 50 MG PO TABS: 50.0000 mg | ORAL_TABLET | Freq: Every day | ORAL | 2 refills | Status: DC

## 2017-07-09 NOTE — Patient Instructions (Addendum)
1. Start Primidone 50 mg tablets. Take 1/2 tablet for 4 nights, then increase to 1 tablet. Prescription has been sent to your pharmacy. The first dose of medication can cause some dizziness/nausea that should go away after the first dose.   2. Your provider has requested that you have labwork completed today. Please go to [facility] ([id number]) on the second floor of this building before leaving the office today. You do not need to check in. If you are not called within 15 minutes please check with the front desk.   

## 2017-07-10 ENCOUNTER — Telehealth: Payer: Self-pay | Admitting: Neurology

## 2017-07-10 NOTE — Telephone Encounter (Signed)
Patient made aware of results.  

## 2017-07-10 NOTE — Telephone Encounter (Signed)
-----   Message from [place], DO sent at 07/10/2017  7:33 AM EST ----- Let pt know that TSH is ok

## 2017-11-10 ENCOUNTER — Ambulatory Visit: Payer: Medicare Other | Admitting: Neurology

## 2018-04-13 ENCOUNTER — Ambulatory Visit: Payer: Medicare Other | Admitting: Interventional Cardiology

## 2018-04-13 ENCOUNTER — Encounter (INDEPENDENT_AMBULATORY_CARE_PROVIDER_SITE_OTHER): Payer: Self-pay

## 2018-04-13 ENCOUNTER — Encounter: Payer: Self-pay | Admitting: Interventional Cardiology

## 2018-04-13 VITALS — BP 126/72 | HR 66 | Ht 66.0 in | Wt 154.0 lb

## 2018-04-13 DIAGNOSIS — R7309 Other abnormal glucose: Secondary | ICD-10-CM

## 2018-04-13 DIAGNOSIS — E7849 Other hyperlipidemia: Secondary | ICD-10-CM

## 2018-04-13 DIAGNOSIS — I25118 Atherosclerotic heart disease of native coronary artery with other forms of angina pectoris: Secondary | ICD-10-CM | POA: Diagnosis not present

## 2018-04-13 NOTE — Progress Notes (Signed)
Cardiology Office Note:    Date:  04/13/2018   ID:  [name], DOB 17-Jun-1940, MRN [id number]  PCP:  Aurea Graff.Marlou Sa, MD  Cardiologist:  Sinclair Grooms, MD   Referring MD: Aurea Graff.Marlou Sa, MD   Chief Complaint  Patient presents with  . Coronary Artery Disease    History of Present Illness:    Keith BRENSINGER is a 77 y.o. male with a hx of coronary artery disease with prior sequential SVG to OM1 and OM 2, LIMA to LAD, and SVG to PDA in 2013, hyperlipidemia,  He is asymptomatic with reference to angina and exertional tolerance.  He has been active working in his yard.  The move to a new house and he has had significant chores that need to be performed.  He is not exercising on a regular basis.  He and wife have been very negative feelings concerning medicinal therapy.  We talked about secondary risk medication.  The one issue that is not being managed appropriately is elevated lipids.  He has tried statins and has side effects.  He will not try them again.  He is not interested and PCSK9 therapy because "if I injected and start having trouble" then there is nothing I can do  Past Medical History:  Diagnosis Date  . Anemia   . Anginal pain ([place]) 04/21/2012  . Arthritis   . Bleeding internal hemorrhoids 2012  . Coronary artery disease   . H/O hiatal hernia   . History of blood transfusion 2012   "related to bleeding hemorrhoids" (05/06/2012)  . Hyperlipidemia   . 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 Myocardial infarction ([place]) 04/21/2012  . Pneumonia    "as a child" (05/06/2012)  . 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     Past Surgical History:  Procedure Laterality Date  . CARDIAC CATHETERIZATION  04/21/2012  . CORONARY ARTERY BYPASS GRAFT  05/10/2012   Procedure: CORONARY ARTERY BYPASS GRAFTING (CABG);  Surgeon: Grace Isaac, MD;   Location: [facility];  Service: Open Heart Surgery;  Laterality: N/A;  times three using Left Greater Saphenous Vein Graft harvested endoscopically and Left Internal Mammary Artery  . EXCISIONAL HEMORRHOIDECTOMY  2012   "tried to shrink 1st w/needle; cut them out 2 wk later" (05/06/2012)  . EYE SURGERY Bilateral    cataracts  . INCISION AND DRAINAGE ABSCESS Right 03/25/2017   Procedure: INCISION AND DRAINAGE ABSCESS;  Surgeon: Jovita Kussmaul, MD;  Location: [facility];  Service: General;  Laterality: Right;  . INTRAOPERATIVE TRANSESOPHAGEAL ECHOCARDIOGRAM  05/10/2012   Procedure: INTRAOPERATIVE TRANSESOPHAGEAL ECHOCARDIOGRAM;  Surgeon: Grace Isaac, MD;  Location: [facility];  Service: Open Heart Surgery;  Laterality: N/A;  . MELANOMA EXCISION  2009   "left side of head" (05/06/2012)  . TONSILLECTOMY AND ADENOIDECTOMY  ~ 1950  . TOTAL HIP ARTHROPLASTY Left 05/26/2016   Procedure: TOTAL HIP ARTHROPLASTY ANTERIOR APPROACH;  Surgeon: Dorna Leitz, MD;  Location: [facility];  Service: Orthopedics;  Laterality: Left;    Current Medications: Current Meds  Medication Sig  . acetaminophen (TYLENOL) 500 MG tablet Take 500 mg by mouth every 6 (six) hours as needed for moderate pain.  Marland Kitchen aspirin EC 81 MG tablet Take 81 mg by mouth daily.  . Coenzyme Q10 (COQ10) 200 MG CAPS Take 200 mg by mouth daily.  . fish  oil-omega-3 fatty acids 1000 MG capsule Take 1 g by mouth 3 (three) times daily.      Allergies:   Atorvastatin; Statins; and Codeine   Social History   Socioeconomic History  . Marital status: Married    Spouse name: Not on file  . Number of children: Not on file  . Years of education: Not on file  . Highest education level: Not on file  Occupational History  . Occupation: retired    Comment: Geographical information systems officer  . Financial resource strain: Not on file  . Food insecurity:    Worry: Not on file    Inability: Not on file  . Transportation needs:    Medical: Not on file    Non-medical: Not on  file  Tobacco Use  . Smoking status: Never Smoker  . Smokeless tobacco: Never Used  Substance and Sexual Activity  . Alcohol use: No    Frequency: Never  . Drug use: No  . Sexual activity: Yes  Lifestyle  . Physical activity:    Days per week: Not on file    Minutes per session: Not on file  . Stress: Not on file  Relationships  . Social connections:    Talks on phone: Not on file    Gets together: Not on file    Attends religious service: Not on file    Active member of club or organization: Not on file    Attends meetings of clubs or organizations: Not on file    Relationship status: Not on file  Other Topics Concern  . Not on file  Social History Narrative  . Not on file     Family History: The patient's family history includes Diabetes in his sister; Diabetes Mellitus II in his mother; Heart attack in his father; Heart disease in his mother.  ROS:   Please see the history of present illness.    Decreased memory.  Recent diagnosis of benign essential tremor.  All other systems reviewed and are negative.  EKGs/Labs/Other Studies Reviewed:    The following studies were reviewed today: No new data.  Refuses lipid panel.  EKG:  EKG is  ordered today.  The ekg ordered today demonstrates sinus rhythm at 66 bpm, left axis deviation, poor R wave progression.  When compared to prior tracings,, heart rate is slower.  Recent Labs: 07/09/2017: TSH 2.54  Recent Lipid Panel    Component Value Date/Time   CHOL 191 04/04/2015 0908   TRIG 133 04/04/2015 0908   HDL 52 04/04/2015 0908   CHOLHDL 3.7 04/04/2015 0908   VLDL 27 04/04/2015 0908   LDLCALC 112 04/04/2015 0908    Physical Exam:    VS:  BP 126/72   Pulse 66   Ht 5\' 6"  (1.676 m)   Wt 154 lb (69.9 kg)   BMI 24.86 kg/m     Wt Readings from Last 3 Encounters:  04/13/18 154 lb (69.9 kg)  07/09/17 152 lb (68.9 kg)  04/03/17 151 lb (68.5 kg)     GEN:  Well nourished, well developed in no acute distress. HEENT:  Normal NECK: No JVD. LYMPHATICS: No lymphadenopathy CARDIAC: RRR, no murmur, no gallop, no edema. VASCULAR: 2+ bilateral posterior tibial pulses.  2+ bilateral carotid pulses.  No bruits. RESPIRATORY:  Clear to auscultation without rales, wheezing or rhonchi  ABDOMEN: Soft, non-tender, non-distended, No pulsatile mass, MUSCULOSKELETAL: Deformity is noted. SKIN: Warm and dry NEUROLOGIC:  Alert and oriented x 3 PSYCHIATRIC:  Normal affect  ASSESSMENT:    1. Atherosclerosis of native coronary artery of native heart with stable angina pectoris ([place])   2. Other hyperlipidemia   3. Elevated hemoglobin A1c    PLAN:    In order of problems listed above:  1. Asymptomatic with recent coronary bypass grafting.  Secondary risk medication was discussed in detail: LDL less than 70 (achieving), blood pressure less than 130/80 mmHg, greater than 150 minutes of moderate aerobic activity per week, smoking cessation (not applicable), and screening for glycemic control (not diabetic). 2. Refuses to have blood draw.  Refuses therapy. 3. His primary care physician should follow hemoglobin A1c.  Physical activity should help.   Despite a long conversation and every attempt to encourage lipid management, we have once again failed.  At the clinic consultation.  Greater than 50% of the time during this office visit was spent in education, counseling, and coordination of care related to underlying disease process and testing as outlined.    Medication Adjustments/Labs and Tests Ordered: Current medicines are reviewed at length with the patient today.  Concerns regarding medicines are outlined above.  Orders Placed This Encounter  Procedures  . EKG 12-Lead   No orders of the defined types were placed in this encounter.   Patient Instructions  Medication Instructions:  Your physician recommends that you continue on your current medications as directed. Please refer to the Current Medication list  given to you today.  If you need a refill on your cardiac medications before your next appointment, please call your pharmacy.   Lab work: None If you have labs (blood work) drawn today and your tests are completely normal, you will receive your results only by: Marland Kitchen MyChart Message (if you have MyChart) OR . A paper copy in the mail If you have any lab test that is abnormal or we need to change your treatment, we will call you to review the results.  Testing/Procedures: None  Follow-Up: At [facility], you and your health needs are our priority.  As part of our continuing mission to provide you with exceptional heart care, we have created designated Provider Care Teams.  These Care Teams include your primary Cardiologist (physician) and Advanced Practice Providers (APPs -  Physician Assistants and Nurse Practitioners) who all work together to provide you with the care you need, when you need it. You will need a follow up appointment in 12 months.  Please call our office 2 months in advance to schedule this appointment.  You may see Sinclair Grooms, MD or one of the following Advanced Practice Providers on your designated Care Team:   Truitt Merle, NP Cecilie Kicks, NP . Kathyrn Drown, NP  Any Other Special Instructions Will Be Listed Below (If Applicable).  Your physician recommends that you participate in moderate aerobic activity at least 150 minutes per week.         Signed, Sinclair Grooms, MD  04/13/2018 8:57 AM    [place]

## 2018-04-13 NOTE — Patient Instructions (Addendum)
Medication Instructions:  Your physician recommends that you continue on your current medications as directed. Please refer to the Current Medication list given to you today.  If you need a refill on your cardiac medications before your next appointment, please call your pharmacy.   Lab work: None If you have labs (blood work) drawn today and your tests are completely normal, you will receive your results only by: Marland Kitchen MyChart Message (if you have MyChart) OR . A paper copy in the mail If you have any lab test that is abnormal or we need to change your treatment, we will call you to review the results.  Testing/Procedures: None  Follow-Up: At [facility], you and your health needs are our priority.  As part of our continuing mission to provide you with exceptional heart care, we have created designated Provider Care Teams.  These Care Teams include your primary Cardiologist (physician) and Advanced Practice Providers (APPs -  Physician Assistants and Nurse Practitioners) who all work together to provide you with the care you need, when you need it. You will need a follow up appointment in 12 months.  Please call our office 2 months in advance to schedule this appointment.  You may see Sinclair Grooms, MD or one of the following Advanced Practice Providers on your designated Care Team:   Truitt Merle, NP Cecilie Kicks, NP . Kathyrn Drown, NP  Any Other Special Instructions Will Be Listed Below (If Applicable).  Your physician recommends that you participate in moderate aerobic activity at least 150 minutes per week.

## 2018-05-17 ENCOUNTER — Other Ambulatory Visit: Payer: Self-pay | Admitting: Orthopedic Surgery

## 2018-05-17 ENCOUNTER — Telehealth: Payer: Self-pay | Admitting: *Deleted

## 2018-05-17 NOTE — Telephone Encounter (Signed)
   [facility] Pre-operative Risk Assessment    Request for surgical clearance:  1. What type of surgery is being performed? RIGHT ANTERIOR TOTAL HIP   2. When is this surgery scheduled? 06/25/18   3. Are there any medications that need to be held prior to surgery and how long? ASA   4. Practice name and name of physician performing surgery? [facility]; DR. Jenny Reichmann [place]    5. What is your office phone and fax number? PH# [phone]; FAX# [phone] ATTN: STEPHANIE GRIFFIN   6. Anesthesia type (None, local, MAC, general) ? SPINAL    Keith Cunningham 05/17/2018, 4:53 PM  _________________________________________________________________   (provider comments below)

## 2018-05-18 ENCOUNTER — Other Ambulatory Visit: Payer: Self-pay | Admitting: Orthopedic Surgery

## 2018-05-18 NOTE — Telephone Encounter (Signed)
Okay to proceed and can hold aspirin.

## 2018-05-18 NOTE — Telephone Encounter (Signed)
Last seen in October 2019 by Dr. Tamala Julian - doing well.   Should be ok to proceed. Will ask Dr. Tamala Julian to give input regarding holding of aspirin.   Dr.Smith - please advised about holding aspirin and route your response back to   [facility] [place], RN, [place] [address] [place], [place]  [phone]

## 2018-06-15 NOTE — Patient Instructions (Signed)
Keith Cunningham  06/15/2018   Your procedure is scheduled on: 06-25-18   Report to [facility] Main  Entrance    Report to admitting at 7:15AM    Call this number if you have problems the morning of surgery [phone]       Remember: Do not eat food or drink liquids :After Midnight. BRUSH YOUR TEETH MORNING OF SURGERY AND RINSE YOUR MOUTH OUT, NO CHEWING GUM CANDY OR MINTS.     Take these medicines the morning of surgery with A SIP OF WATER: NONE                                 You may not have any metal on your body including hair pins and              piercings  Do not wear jewelry, make-up, lotions, powders or perfumes, deodorant              Men may shave face and neck.   Do not bring valuables to the hospital. [place].  Contacts, dentures or bridgework may not be worn into surgery.  Leave suitcase in the car. After surgery it may be brought to your room.                  Please read over the following fact sheets you were given: _____________________________________________________________________             [facility] - Preparing for Surgery Before surgery, you can play an important role.  Because skin is not sterile, your skin needs to be as free of germs as possible.  You can reduce the number of germs on your skin by washing with CHG (chlorahexidine gluconate) soap before surgery.  CHG is an antiseptic cleaner which kills germs and bonds with the skin to continue killing germs even after washing. Please DO NOT use if you have an allergy to CHG or antibacterial soaps.  If your skin becomes reddened/irritated stop using the CHG and inform your nurse when you arrive at Short Stay. Do not shave (including legs and underarms) for at least 48 hours prior to the first CHG shower.  You may shave your face/neck. Please follow these instructions carefully:  1.  Shower with CHG Soap the night  before surgery and the  morning of Surgery.  2.  If you choose to wash your hair, wash your hair first as usual with your  normal  shampoo.  3.  After you shampoo, rinse your hair and body thoroughly to remove the  shampoo.                           4.  Use CHG as you would any other liquid soap.  You can apply chg directly  to the skin and wash                       Gently with a scrungie or clean washcloth.  5.  Apply the CHG Soap to your body ONLY FROM THE NECK DOWN.   Do not use on face/ open  Wound or open sores. Avoid contact with eyes, ears mouth and genitals (private parts).                       Wash face,  Genitals (private parts) with your normal soap.             6.  Wash thoroughly, paying special attention to the area where your surgery  will be performed.  7.  Thoroughly rinse your body with warm water from the neck down.  8.  DO NOT shower/wash with your normal soap after using and rinsing off  the CHG Soap.                9.  Pat yourself dry with a clean towel.            10.  Wear clean pajamas.            11.  Place clean sheets on your bed the night of your first shower and do not  sleep with pets. Day of Surgery : Do not apply any lotions/deodorants the morning of surgery.  Please wear clean clothes to the hospital/surgery center.  FAILURE TO FOLLOW THESE INSTRUCTIONS MAY RESULT IN THE CANCELLATION OF YOUR SURGERY PATIENT SIGNATURE_________________________________  NURSE SIGNATURE__________________________________  ________________________________________________________________________   Keith Cunningham  An incentive spirometer is a tool that can help keep your lungs clear and active. This tool measures how well you are filling your lungs with each breath. Taking long deep breaths may help reverse or decrease the chance of developing breathing (pulmonary) problems (especially infection) following:  A long period of time when you are  unable to move or be active. BEFORE THE PROCEDURE   If the spirometer includes an indicator to show your best effort, your nurse or respiratory therapist will set it to a desired goal.  If possible, sit up straight or lean slightly forward. Try not to slouch.  Hold the incentive spirometer in an upright position. INSTRUCTIONS FOR USE  1. Sit on the edge of your bed if possible, or sit up as far as you can in bed or on a chair. 2. Hold the incentive spirometer in an upright position. 3. Breathe out normally. 4. Place the mouthpiece in your mouth and seal your lips tightly around it. 5. Breathe in slowly and as deeply as possible, raising the piston or the ball toward the top of the column. 6. Hold your breath for 3-5 seconds or for as long as possible. Allow the piston or ball to fall to the bottom of the column. 7. Remove the mouthpiece from your mouth and breathe out normally. 8. Rest for a few seconds and repeat Steps 1 through 7 at least 10 times every 1-2 hours when you are awake. Take your time and take a few normal breaths between deep breaths. 9. The spirometer may include an indicator to show your best effort. Use the indicator as a goal to work toward during each repetition. 10. After each set of 10 deep breaths, practice coughing to be sure your lungs are clear. If you have an incision (the cut made at the time of surgery), support your incision when coughing by placing a pillow or rolled up towels firmly against it. Once you are able to get out of bed, walk around indoors and cough well. You may stop using the incentive spirometer when instructed by your caregiver.  RISKS AND COMPLICATIONS  Take your time so you do not get  dizzy or light-headed.  If you are in pain, you may need to take or ask for pain medication before doing incentive spirometry. It is harder to take a deep breath if you are having pain. AFTER USE  Rest and breathe slowly and easily.  It can be helpful to  keep track of a log of your progress. Your caregiver can provide you with a simple table to help with this. If you are using the spirometer at home, follow these instructions: [place] IF:   You are having difficultly using the spirometer.  You have trouble using the spirometer as often as instructed.  Your pain medication is not giving enough relief while using the spirometer.  You develop fever of 100.5 F (38.1 C) or higher. SEEK IMMEDIATE MEDICAL CARE IF:   You cough up bloody sputum that had not been present before.  You develop fever of 102 F (38.9 C) or greater.  You develop worsening pain at or near the incision site. MAKE SURE YOU:   Understand these instructions.  Will watch your condition.  Will get help right away if you are not doing well or get worse. Document Released: 10/13/2006 Document Revised: 08/25/2011 Document Reviewed: 12/14/2006 [facility] Patient Information 2014 [place], [place].   ________________________________________________________________________

## 2018-06-15 NOTE — Progress Notes (Signed)
Cardiac clearance , see tele note 05-17-18 epic   lov cardiology Dr Tamala Julian 04-13-18 epic   EKG 04-13-18 epic

## 2018-06-17 ENCOUNTER — Ambulatory Visit (HOSPITAL_COMMUNITY)
Admission: RE | Admit: 2018-06-17 | Discharge: 2018-06-17 | Disposition: A | Discharge status: Home / Self Care | Payer: Medicare Other | Source: Ambulatory Visit | Attending: Orthopedic Surgery | Admitting: Orthopedic Surgery

## 2018-06-17 ENCOUNTER — Other Ambulatory Visit: Payer: Self-pay

## 2018-06-17 ENCOUNTER — Encounter (HOSPITAL_COMMUNITY): Payer: Self-pay

## 2018-06-17 ENCOUNTER — Encounter (HOSPITAL_COMMUNITY)
Admission: RE | Admit: 2018-06-17 | Discharge: 2018-06-17 | Disposition: A | Discharge status: Home / Self Care | Payer: Medicare Other | Source: Ambulatory Visit | Attending: Orthopedic Surgery | Admitting: Orthopedic Surgery

## 2018-06-17 DIAGNOSIS — Z01811 Encounter for preprocedural respiratory examination: Secondary | ICD-10-CM | POA: Insufficient documentation

## 2018-06-17 HISTORY — DX: Unspecified hearing loss, unspecified ear: H91.90

## 2018-06-17 HISTORY — DX: Unspecified staphylococcus as the cause of diseases classified elsewhere: B95.8

## 2018-06-17 LAB — COMPREHENSIVE METABOLIC PANEL
ALT: 22 U/L (ref 0–44)
AST: 29 U/L (ref 15–41)
Albumin: 4 g/dL (ref 3.5–5.0)
Alkaline Phosphatase: 68 U/L (ref 38–126)
Anion gap: 8 (ref 5–15)
BUN: 17 mg/dL (ref 8–23)
CO2: 26 mmol/L (ref 22–32)
Calcium: 9.4 mg/dL (ref 8.9–10.3)
Chloride: 109 mmol/L (ref 98–111)
Creatinine, Ser: 0.89 mg/dL (ref 0.61–1.24)
GFR calc Af Amer: >60 mL/min (ref >60)
GFR calc non Af Amer: >60 mL/min (ref >60)
Glucose, Bld: 95 mg/dL (ref 70–99)
Potassium: 5.3 mmol/L — ABNORMAL HIGH (ref 3.5–5.1)
Sodium: 143 mmol/L (ref 135–145)
Total Bilirubin: 1.1 mg/dL (ref 0.3–1.2)
Total Protein: 7.1 g/dL (ref 6.5–8.1)

## 2018-06-17 LAB — CBC WITH DIFFERENTIAL/PLATELET
Abs Immature Granulocytes: 0.01 10*3/uL (ref 0.00–0.07)
Basophils Absolute: 0 10*3/uL (ref 0.0–0.1)
Basophils Relative: 0 %
Eosinophils Absolute: 0.1 10*3/uL (ref 0.0–0.5)
Eosinophils Relative: 1 %
HCT: 46.6 % (ref 39.0–52.0)
Hemoglobin: 14.8 g/dL (ref 13.0–17.0)
IMMATURE GRANULOCYTES: 0 %
Lymphocytes Relative: 34 %
Lymphs Abs: 1.8 10*3/uL (ref 0.7–4.0)
MCH: 28.9 pg (ref 26.0–34.0)
MCHC: 31.8 g/dL (ref 30.0–36.0)
MCV: 91 fL (ref 80.0–100.0)
Monocytes Absolute: 0.5 10*3/uL (ref 0.1–1.0)
Monocytes Relative: 9 %
NEUTROS PCT: 56 %
Neutro Abs: 2.9 10*3/uL (ref 1.7–7.7)
Platelets: 221 10*3/uL (ref 150–400)
RBC: 5.12 MIL/uL (ref 4.22–5.81)
RDW: 13.6 % (ref 11.5–15.5)
WBC: 5.3 10*3/uL (ref 4.0–10.5)
nRBC: 0 % (ref 0.0–0.2)

## 2018-06-17 LAB — URINALYSIS, ROUTINE W REFLEX MICROSCOPIC
Bilirubin Urine: NEGATIVE
Glucose, UA: NEGATIVE mg/dL
Ketones, ur: NEGATIVE mg/dL
Leukocytes, UA: NEGATIVE
Nitrite: NEGATIVE
PH: 6 (ref 5.0–8.0)
Protein, ur: NEGATIVE mg/dL
Specific Gravity, Urine: 1.006 (ref 1.005–1.030)

## 2018-06-17 LAB — PROTIME-INR
INR: 1.01
Prothrombin Time: 13.2 seconds (ref 11.4–15.2)

## 2018-06-17 LAB — SURGICAL PCR SCREEN
MRSA, PCR: NEGATIVE
STAPHYLOCOCCUS AUREUS: POSITIVE — AB

## 2018-06-17 LAB — APTT: aPTT: 55 seconds — ABNORMAL HIGH (ref 24–36)

## 2018-06-17 NOTE — Progress Notes (Signed)
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   History of cardiac cath in 2013 which showed moderate disease in the proximal circumflex and distal right coronary.  He underwent subsequent CABG x4 05/10/12.  He has done well since procedure.  Pt was last seen by cardiology, Sinclair Grooms, MD, on 04/13/18. At this visit he was asymptomatic, active working in the yard.  Therapy discussed for better control of HLD at this visit, pt refused additional medications.  Telephone encounter on 05/17/18 from Dr. Tamala Julian regarding cardiac clearance states, "Okay to proceed and can hold aspirin."       Pt can proceed with planned procedure barring acute status change.  VS: BP (!) 146/86   Pulse 74   Temp 36.6 C (Oral)   Resp 18   Ht 5\' 5"  (1.651 m)   Wt 68.9 kg   SpO2 100%   BMI 25.29 kg/m   PROVIDERS: Mitchell, L.Marlou Sa, MD is PCP  Belva Crome, MD is Cardiologist  LABS: Labs reviewed: Acceptable for surgery. (all labs ordered are listed, but only abnormal results are displayed)  Labs Reviewed  SURGICAL PCR SCREEN - Abnormal; Notable for the following components:      Result Value   Staphylococcus aureus POSITIVE (*)    All other components within normal limits  APTT - Abnormal; Notable for the following components:   aPTT 55 (*)    All other components within normal limits  COMPREHENSIVE METABOLIC PANEL - Abnormal; Notable for the following components:   Potassium 5.3 (*)    All other components within normal  limits  URINALYSIS, ROUTINE W REFLEX MICROSCOPIC - Abnormal; Notable for the following components:   Color, Urine STRAW (*)    Hgb urine dipstick SMALL (*)    Bacteria, UA RARE (*)    All other components within normal limits  CBC WITH DIFFERENTIAL/PLATELET  PROTIME-INR  TYPE AND SCREEN     IMAGES: Chest xray 06/17/18:  FINDINGS: Patient status post median sternotomy. Stable cardiac and mediastinal contours. No consolidative pulmonary opacities. No pleural effusion or pneumothorax. Thoracic spine degenerative changes.  IMPRESSION: No acute cardiopulmonary process.   EKG: 04/13/18 Rate 66bpm Normal sinus rhythm Left axis deviation Low voltage QRS Cannot rule out Anterior infarct, age undetermined  CV: Cardiac Cath 05/07/2012 (Pt underwent subsequent CABG following cath) Ostial LAD and ramus obstruction accounting for the early positive exercise treadmill test. 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  2. Normal left ventricular function  3. Early positive exercise treadmill test with progression of symptoms and clinical unstable angina with episodes of pain at rest.  Past Medical History:  Diagnosis Date  . Anemia   . Anginal pain ([place]) 04/21/2012  . Arthritis   . Bleeding internal hemorrhoids 2012  . Coronary artery disease   . H/O hiatal hernia   . History of blood transfusion 2012   "related to bleeding hemorrhoids" (05/06/2012)  . 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 Hyperlipidemia   . 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 Myocardial infarction ([place]) 04/21/2012  . Pneumonia    "as a child" (05/06/2012)  . 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   Marland Kitchen Staph infection 03/2017   had I and D of infected abscess     Past Surgical History:  Procedure Laterality Date  . CARDIAC  CATHETERIZATION  04/21/2012  . CATARACT EXTRACTION, BILATERAL Bilateral   . CORONARY ARTERY BYPASS GRAFT  05/10/2012   Procedure: CORONARY ARTERY BYPASS GRAFTING (CABG);  Surgeon: Grace Isaac, MD;  Location: [place];  Service: Open Heart Surgery;  Laterality: N/A;  times three using Left Greater Saphenous Vein Graft harvested endoscopically and Left Internal Mammary Artery  . EXCISIONAL HEMORRHOIDECTOMY  2012   "tried to shrink 1st w/needle; cut them out 2 wk later" (05/06/2012)  . INCISION AND DRAINAGE ABSCESS Right 03/25/2017   Procedure: INCISION AND DRAINAGE ABSCESS;  Surgeon: Jovita Kussmaul, MD;  [facility];  Service: General;  Laterality: Right;  . INTRAOPERATIVE TRANSESOPHAGEAL ECHOCARDIOGRAM  05/10/2012   Procedure: INTRAOPERATIVE TRANSESOPHAGEAL ECHOCARDIOGRAM;  Surgeon: Grace Isaac, MD;  Location: [facility];  Service: Open Heart Surgery;  Laterality: N/A;  . MELANOMA EXCISION  2009   "left side of head" (05/06/2012)  . TONSILLECTOMY AND ADENOIDECTOMY  ~ 1950  . TOTAL HIP ARTHROPLASTY Left 05/26/2016   Procedure: TOTAL HIP ARTHROPLASTY ANTERIOR APPROACH;  Surgeon: Dorna Leitz, MD;  Location: [place];  Service: Orthopedics;  Laterality: Left;    MEDICATIONS: . acetaminophen (TYLENOL) 500 MG tablet  . aspirin EC 81 MG tablet  . Coenzyme Q10 (COQ10) 200 MG CAPS  . Omega-3 Fatty Acids (FISH OIL) 500 MG CAPS   No current facility-administered medications for this encounter.      Konrad Felix, PA-C [facility]Surgical Testing [phone] 06/18/18 10:14 AM

## 2018-06-17 NOTE — Progress Notes (Signed)
Patient chart given to [place], [place] for review

## 2018-06-18 NOTE — Anesthesia Preprocedure Evaluation (Addendum)
Anesthesia Evaluation  Patient identified by MRN, date of birth, ID band Patient awake    Reviewed: Allergy & Precautions, NPO status , Patient's Chart, lab work & pertinent test results  Airway Mallampati: II  TM Distance: >3 FB Neck ROM: Full    Dental no notable dental hx. (+) Teeth Intact, Dental Advisory Given   Pulmonary neg pulmonary ROS,    Pulmonary exam normal breath sounds clear to auscultation       Cardiovascular Exercise Tolerance: Good + CAD and + Past MI  Normal cardiovascular exam Rhythm:Regular Rate:Normal  EKG SR r 66  S/p CABG 2013 - assymptomatic since   Neuro/Psych negative neurological ROS  negative psych ROS   GI/Hepatic Neg liver ROS, hiatal hernia,   Endo/Other  negative endocrine ROS  Renal/GU negative Renal ROS     Musculoskeletal  (+) Arthritis ,   Abdominal   Peds  Hematology negative hematology ROS (+)   Anesthesia Other Findings   Reproductive/Obstetrics                            Anesthesia Physical Anesthesia Plan  ASA: III  Anesthesia Plan: Spinal   Post-op Pain Management:    Induction:   PONV Risk Score and Plan: 2 and Treatment may vary due to age or medical condition, Ondansetron and Dexamethasone  Airway Management Planned: Nasal Cannula and Natural Airway  Additional Equipment:   Intra-op Plan:   Post-operative Plan:   Informed Consent: I have reviewed the patients History and Physical, chart, labs and discussed the procedure including the risks, benefits and alternatives for the proposed anesthesia with the patient or authorized representative who has indicated his/her understanding and acceptance.   Dental advisory given  Plan Discussed with: CRNA  Anesthesia Plan Comments: (See PST note 06/17/2018, Konrad Felix, PA-C)       Anesthesia Quick Evaluation

## 2018-06-21 NOTE — Progress Notes (Signed)
Family member of Mr. Faulk called and stated they received a call from Dr. Berenice Primas office with a time change. Confirmed time change and instructed patient to arrive at 1200. No food after midnight (1/9) and clear liquids (clarified what this is for patient) until 0800. Verbalized understanding.

## 2018-06-22 MED ORDER — SODIUM CHLORIDE (PF) 0.9 % IJ SOLN
INTRAMUSCULAR | Status: AC
  Filled 2018-06-22: qty 50

## 2018-06-24 MED ORDER — BUPIVACAINE LIPOSOME 1.3 % IJ SUSP
10.0000 mL | Freq: Once | INTRAMUSCULAR | Status: DC
  Filled 2018-06-24: qty 10

## 2018-06-24 NOTE — Progress Notes (Addendum)
Pt and pts wife aware of surgical time change for 06/25/2018. Aware to arrive at [facility] admitting by 10:45 am and no food or drink after midnight.

## 2018-06-24 NOTE — Progress Notes (Signed)
RN called and spoke with patient and informed there was change in time of surgery tomorrow . Patient stated he was currently driving and could not write down info. RN and patient agreed to have RN LVMM for patient with the info and call back number in case of any questions. RN hung up and called patient and was able to leave agreed upon VMM.

## 2018-06-25 ENCOUNTER — Inpatient Hospital Stay (HOSPITAL_COMMUNITY): Payer: Medicare Other

## 2018-06-25 ENCOUNTER — Inpatient Hospital Stay (HOSPITAL_COMMUNITY)
Admission: RE | Admit: 2018-06-25 | Discharge: 2018-06-26 | DRG: 470 | Disposition: A | Discharge status: Home Health | Payer: Medicare Other | Attending: Orthopedic Surgery | Admitting: Orthopedic Surgery

## 2018-06-25 ENCOUNTER — Inpatient Hospital Stay (HOSPITAL_COMMUNITY): Payer: Medicare Other | Admitting: Physician Assistant

## 2018-06-25 ENCOUNTER — Encounter (HOSPITAL_COMMUNITY)
Admission: RE | Disposition: A | Discharge status: Home Health | Payer: Self-pay | Source: Home / Self Care | Attending: Orthopedic Surgery

## 2018-06-25 ENCOUNTER — Encounter (HOSPITAL_COMMUNITY): Payer: Self-pay | Admitting: *Deleted

## 2018-06-25 ENCOUNTER — Other Ambulatory Visit: Payer: Self-pay

## 2018-06-25 ENCOUNTER — Inpatient Hospital Stay (HOSPITAL_COMMUNITY): Payer: Medicare Other | Admitting: Certified Registered"

## 2018-06-25 DIAGNOSIS — Z951 Presence of aortocoronary bypass graft: Secondary | ICD-10-CM | POA: Diagnosis not present

## 2018-06-25 DIAGNOSIS — Z96642 Presence of left artificial hip joint: Secondary | ICD-10-CM | POA: Diagnosis present

## 2018-06-25 DIAGNOSIS — M1611 Unilateral primary osteoarthritis, right hip: Secondary | ICD-10-CM | POA: Diagnosis present

## 2018-06-25 DIAGNOSIS — Z885 Allergy status to narcotic agent status: Secondary | ICD-10-CM

## 2018-06-25 DIAGNOSIS — Z79899 Other long term (current) drug therapy: Secondary | ICD-10-CM | POA: Diagnosis not present

## 2018-06-25 DIAGNOSIS — Z8781 Personal history of (healed) traumatic fracture: Secondary | ICD-10-CM | POA: Diagnosis not present

## 2018-06-25 DIAGNOSIS — Z888 Allergy status to other drugs, medicaments and biological substances status: Secondary | ICD-10-CM

## 2018-06-25 DIAGNOSIS — Z419 Encounter for procedure for purposes other than remedying health state, unspecified: Secondary | ICD-10-CM

## 2018-06-25 DIAGNOSIS — H9193 Unspecified hearing loss, bilateral: Secondary | ICD-10-CM | POA: Diagnosis present

## 2018-06-25 DIAGNOSIS — E785 Hyperlipidemia, unspecified: Secondary | ICD-10-CM | POA: Diagnosis present

## 2018-06-25 DIAGNOSIS — Z8249 Family history of ischemic heart disease and other diseases of the circulatory system: Secondary | ICD-10-CM

## 2018-06-25 DIAGNOSIS — Z85820 Personal history of malignant melanoma of skin: Secondary | ICD-10-CM | POA: Diagnosis not present

## 2018-06-25 DIAGNOSIS — H5461 Unqualified visual loss, right eye, normal vision left eye: Secondary | ICD-10-CM | POA: Diagnosis present

## 2018-06-25 DIAGNOSIS — Z833 Family history of diabetes mellitus: Secondary | ICD-10-CM

## 2018-06-25 DIAGNOSIS — Z7982 Long term (current) use of aspirin: Secondary | ICD-10-CM | POA: Diagnosis not present

## 2018-06-25 DIAGNOSIS — Q6589 Other specified congenital deformities of hip: Secondary | ICD-10-CM | POA: Diagnosis not present

## 2018-06-25 DIAGNOSIS — I252 Old myocardial infarction: Secondary | ICD-10-CM | POA: Diagnosis not present

## 2018-06-25 HISTORY — PX: TOTAL HIP ARTHROPLASTY: SHX124

## 2018-06-25 LAB — TYPE AND SCREEN
ABO/RH(D): O POS
Antibody Screen: NEGATIVE

## 2018-06-25 SURGERY — ARTHROPLASTY, HIP, TOTAL, ANTERIOR APPROACH
Anesthesia: Spinal | Site: Hip | Laterality: Right

## 2018-06-25 MED ORDER — PHENYLEPHRINE 40 MCG/ML (10ML) SYRINGE FOR IV PUSH (FOR BLOOD PRESSURE SUPPORT)
PREFILLED_SYRINGE | INTRAVENOUS | Status: AC
  Filled 2018-06-25: qty 10

## 2018-06-25 MED ORDER — PROPOFOL 10 MG/ML IV BOLUS
INTRAVENOUS | Status: AC
  Filled 2018-06-25: qty 40

## 2018-06-25 MED ORDER — DEXAMETHASONE SODIUM PHOSPHATE 10 MG/ML IJ SOLN
INTRAMUSCULAR | Status: DC | PRN
  Administered 2018-06-25: 4 mg via INTRAVENOUS

## 2018-06-25 MED ORDER — LACTATED RINGERS IV SOLN
INTRAVENOUS | Status: DC
  Administered 2018-06-25 (×2): via INTRAVENOUS

## 2018-06-25 MED ORDER — ONDANSETRON HCL 4 MG PO TABS: 4.0000 mg | ORAL_TABLET | Freq: Four times a day (QID) | ORAL | Status: DC | PRN

## 2018-06-25 MED ORDER — ASPIRIN EC 325 MG PO TBEC: 325.0000 mg | DELAYED_RELEASE_TABLET | Freq: Two times a day (BID) | ORAL | 0 refills | Status: DC

## 2018-06-25 MED ORDER — DOCUSATE SODIUM 100 MG PO CAPS
100.0000 mg | ORAL_CAPSULE | Freq: Two times a day (BID) | ORAL | Status: DC
  Administered 2018-06-25 – 2018-06-26 (×2): 100 mg via ORAL
  Filled 2018-06-25 (×2): qty 1

## 2018-06-25 MED ORDER — MIDAZOLAM HCL 2 MG/2ML IJ SOLN
INTRAMUSCULAR | Status: DC | PRN
  Administered 2018-06-25: 1 mg via INTRAVENOUS

## 2018-06-25 MED ORDER — WATER FOR IRRIGATION, STERILE IR SOLN
Status: DC | PRN
  Administered 2018-06-25: 2000 mL

## 2018-06-25 MED ORDER — TRANEXAMIC ACID-NACL 1000-0.7 MG/100ML-% IV SOLN
1000.0000 mg | Freq: Once | INTRAVENOUS | Status: AC
  Administered 2018-06-25: 1000 mg via INTRAVENOUS
  Filled 2018-06-25: qty 100

## 2018-06-25 MED ORDER — ONDANSETRON HCL 4 MG/2ML IJ SOLN
INTRAMUSCULAR | Status: DC | PRN
  Administered 2018-06-25: 4 mg via INTRAVENOUS

## 2018-06-25 MED ORDER — ACETAMINOPHEN 10 MG/ML IV SOLN: 1000.0000 mg | Freq: Once | INTRAVENOUS | Status: DC | PRN

## 2018-06-25 MED ORDER — MIDAZOLAM HCL 2 MG/2ML IJ SOLN
INTRAMUSCULAR | Status: AC
  Filled 2018-06-25: qty 2

## 2018-06-25 MED ORDER — DEXAMETHASONE SODIUM PHOSPHATE 10 MG/ML IJ SOLN
INTRAMUSCULAR | Status: AC
  Filled 2018-06-25: qty 1

## 2018-06-25 MED ORDER — METHOCARBAMOL 500 MG PO TABS
500.0000 mg | ORAL_TABLET | Freq: Four times a day (QID) | ORAL | Status: DC | PRN
  Administered 2018-06-25: 500 mg via ORAL
  Filled 2018-06-25: qty 1

## 2018-06-25 MED ORDER — DEXAMETHASONE SODIUM PHOSPHATE 10 MG/ML IJ SOLN
10.0000 mg | Freq: Two times a day (BID) | INTRAMUSCULAR | Status: DC
  Administered 2018-06-25 – 2018-06-26 (×2): 10 mg via INTRAVENOUS
  Filled 2018-06-25 (×2): qty 1

## 2018-06-25 MED ORDER — POLYETHYLENE GLYCOL 3350 17 G PO PACK: 17.0000 g | PACK | Freq: Every day | ORAL | Status: DC | PRN

## 2018-06-25 MED ORDER — CHLORHEXIDINE GLUCONATE 4 % EX LIQD: 60.0000 mL | Freq: Once | CUTANEOUS | Status: DC

## 2018-06-25 MED ORDER — SODIUM CHLORIDE 0.9% FLUSH
INTRAVENOUS | Status: DC | PRN
  Administered 2018-06-25: 30 mL

## 2018-06-25 MED ORDER — BUPIVACAINE-EPINEPHRINE (PF) 0.25% -1:200000 IJ SOLN
INTRAMUSCULAR | Status: AC
  Filled 2018-06-25: qty 30

## 2018-06-25 MED ORDER — ASPIRIN EC 325 MG PO TBEC
325.0000 mg | DELAYED_RELEASE_TABLET | Freq: Two times a day (BID) | ORAL | Status: DC
  Administered 2018-06-25 – 2018-06-26 (×2): 325 mg via ORAL
  Filled 2018-06-25 (×2): qty 1

## 2018-06-25 MED ORDER — CEFAZOLIN SODIUM-DEXTROSE 2-4 GM/100ML-% IV SOLN
2.0000 g | Freq: Four times a day (QID) | INTRAVENOUS | Status: AC
  Administered 2018-06-25 – 2018-06-26 (×2): 2 g via INTRAVENOUS
  Filled 2018-06-25 (×2): qty 100

## 2018-06-25 MED ORDER — CEFAZOLIN SODIUM-DEXTROSE 2-4 GM/100ML-% IV SOLN
2.0000 g | INTRAVENOUS | Status: AC
  Administered 2018-06-25: 2 g via INTRAVENOUS
  Filled 2018-06-25: qty 100

## 2018-06-25 MED ORDER — ACETAMINOPHEN 325 MG PO TABS: 325.0000 mg | ORAL_TABLET | Freq: Four times a day (QID) | ORAL | Status: DC | PRN

## 2018-06-25 MED ORDER — ALUM & MAG HYDROXIDE-SIMETH 200-200-20 MG/5ML PO SUSP: 30.0000 mL | ORAL | Status: DC | PRN

## 2018-06-25 MED ORDER — BISACODYL 5 MG PO TBEC: 5.0000 mg | DELAYED_RELEASE_TABLET | Freq: Every day | ORAL | Status: DC | PRN

## 2018-06-25 MED ORDER — PROPOFOL 10 MG/ML IV BOLUS
INTRAVENOUS | Status: AC
  Filled 2018-06-25: qty 20

## 2018-06-25 MED ORDER — OXYCODONE HCL 5 MG PO TABS
5.0000 mg | ORAL_TABLET | ORAL | Status: DC | PRN
  Administered 2018-06-25: 5 mg via ORAL
  Administered 2018-06-25 – 2018-06-26 (×2): 10 mg via ORAL
  Filled 2018-06-25 (×3): qty 2

## 2018-06-25 MED ORDER — PHENYLEPHRINE 40 MCG/ML (10ML) SYRINGE FOR IV PUSH (FOR BLOOD PRESSURE SUPPORT)
PREFILLED_SYRINGE | INTRAVENOUS | Status: DC | PRN
  Administered 2018-06-25 (×5): 120 ug via INTRAVENOUS

## 2018-06-25 MED ORDER — BUPIVACAINE IN DEXTROSE 0.75-8.25 % IT SOLN
INTRATHECAL | Status: DC | PRN
  Administered 2018-06-25: 2 mL via INTRATHECAL

## 2018-06-25 MED ORDER — PROPOFOL 500 MG/50ML IV EMUL
INTRAVENOUS | Status: DC | PRN
  Administered 2018-06-25: 125 ug/kg/min via INTRAVENOUS

## 2018-06-25 MED ORDER — SODIUM CHLORIDE 0.9 % IV SOLN
INTRAVENOUS | Status: DC
  Administered 2018-06-25 – 2018-06-26 (×2): via INTRAVENOUS

## 2018-06-25 MED ORDER — ONDANSETRON HCL 4 MG/2ML IJ SOLN: 4.0000 mg | Freq: Once | INTRAMUSCULAR | Status: DC | PRN

## 2018-06-25 MED ORDER — ONDANSETRON HCL 4 MG/2ML IJ SOLN: 4.0000 mg | Freq: Four times a day (QID) | INTRAMUSCULAR | Status: DC | PRN

## 2018-06-25 MED ORDER — GABAPENTIN 300 MG PO CAPS
300.0000 mg | ORAL_CAPSULE | Freq: Two times a day (BID) | ORAL | Status: DC
  Administered 2018-06-25 – 2018-06-26 (×2): 300 mg via ORAL
  Filled 2018-06-25 (×2): qty 1

## 2018-06-25 MED ORDER — HYDROMORPHONE HCL 1 MG/ML IJ SOLN: 0.5000 mg | INTRAMUSCULAR | Status: DC | PRN

## 2018-06-25 MED ORDER — TRANEXAMIC ACID-NACL 1000-0.7 MG/100ML-% IV SOLN
1000.0000 mg | INTRAVENOUS | Status: AC
  Administered 2018-06-25: 1000 mg via INTRAVENOUS
  Filled 2018-06-25: qty 100

## 2018-06-25 MED ORDER — CELECOXIB 200 MG PO CAPS
200.0000 mg | ORAL_CAPSULE | Freq: Two times a day (BID) | ORAL | Status: DC
  Administered 2018-06-25 – 2018-06-26 (×2): 200 mg via ORAL
  Filled 2018-06-25 (×2): qty 1

## 2018-06-25 MED ORDER — TIZANIDINE HCL 2 MG PO TABS: 2.0000 mg | ORAL_TABLET | Freq: Three times a day (TID) | ORAL | 0 refills | Status: DC | PRN

## 2018-06-25 MED ORDER — ONDANSETRON HCL 4 MG/2ML IJ SOLN
INTRAMUSCULAR | Status: AC
  Filled 2018-06-25: qty 2

## 2018-06-25 MED ORDER — SODIUM CHLORIDE (PF) 0.9 % IJ SOLN
INTRAMUSCULAR | Status: AC
  Filled 2018-06-25: qty 50

## 2018-06-25 MED ORDER — FENTANYL CITRATE (PF) 100 MCG/2ML IJ SOLN: 25.0000 ug | INTRAMUSCULAR | Status: DC | PRN

## 2018-06-25 MED ORDER — BUPIVACAINE LIPOSOME 1.3 % IJ SUSP
INTRAMUSCULAR | Status: DC | PRN
  Administered 2018-06-25: 10 mL

## 2018-06-25 MED ORDER — OXYCODONE-ACETAMINOPHEN 5-325 MG PO TABS: 1.0000 | ORAL_TABLET | Freq: Four times a day (QID) | ORAL | 0 refills | Status: DC | PRN

## 2018-06-25 MED ORDER — BUPIVACAINE-EPINEPHRINE 0.5% -1:200000 IJ SOLN
INTRAMUSCULAR | Status: DC | PRN
  Administered 2018-06-25: 30 mL

## 2018-06-25 MED ORDER — DOCUSATE SODIUM 100 MG PO CAPS: 100.0000 mg | ORAL_CAPSULE | Freq: Two times a day (BID) | ORAL | 0 refills | Status: DC

## 2018-06-25 MED ORDER — SODIUM CHLORIDE 0.9 % IR SOLN
Status: DC | PRN
  Administered 2018-06-25: 1000 mL

## 2018-06-25 MED ORDER — MAGNESIUM CITRATE PO SOLN: 1.0000 | Freq: Once | ORAL | Status: DC | PRN

## 2018-06-25 MED ORDER — DIPHENHYDRAMINE HCL 12.5 MG/5ML PO ELIX: 12.5000 mg | ORAL_SOLUTION | ORAL | Status: DC | PRN

## 2018-06-25 MED ORDER — METHOCARBAMOL 500 MG IVPB - SIMPLE MED
500.0000 mg | Freq: Four times a day (QID) | INTRAVENOUS | Status: DC | PRN
  Filled 2018-06-25: qty 50

## 2018-06-25 SURGICAL SUPPLY — 39 items
BAG ZIPLOCK 12X15 (MISCELLANEOUS) IMPLANT
BENZOIN TINCTURE PRP APPL 2/3 (GAUZE/BANDAGES/DRESSINGS) ×2 IMPLANT
BLADE SAW SGTL 18X1.27X75 (BLADE) ×2 IMPLANT
BLADE SAW SGTL 18X1.27X75MM (BLADE) ×1
BLADE SURG SZ10 CARB STEEL (BLADE) ×6 IMPLANT
CLOSURE WOUND 1/2 X4 (GAUZE/BANDAGES/DRESSINGS) ×1
COVER PERINEAL POST (MISCELLANEOUS) ×3 IMPLANT
COVER SURGICAL LIGHT HANDLE (MISCELLANEOUS) ×3 IMPLANT
COVER WAND RF STERILE (DRAPES) ×2 IMPLANT
CUP PINN GRIPTON 54 100 (Cup) ×2 IMPLANT
DRAPE STERI IOBAN 125X83 (DRAPES) ×3 IMPLANT
DRAPE U-SHAPE 47X51 STRL (DRAPES) ×6 IMPLANT
DRSG AQUACEL AG ADV 3.5X 6 (GAUZE/BANDAGES/DRESSINGS) ×3 IMPLANT
DURAPREP 26ML APPLICATOR (WOUND CARE) ×3 IMPLANT
ELECT REM PT RETURN 15FT ADLT (MISCELLANEOUS) ×3 IMPLANT
ELIMINATOR HOLE APEX DEPUY (Hips) ×2 IMPLANT
GAUZE XEROFORM 1X8 LF (GAUZE/BANDAGES/DRESSINGS) IMPLANT
GLOVE BIOGEL PI IND STRL 8 (GLOVE) ×2 IMPLANT
GLOVE BIOGEL PI INDICATOR 8 (GLOVE) ×4
GLOVE ECLIPSE 7.5 STRL STRAW (GLOVE) ×6 IMPLANT
GOWN STRL REUS W/TWL XL LVL3 (GOWN DISPOSABLE) ×6 IMPLANT
HEAD M SROM 36MM 2 (Hips) IMPLANT
HOLDER FOLEY CATH W/STRAP (MISCELLANEOUS) ×3 IMPLANT
HOOD PEEL AWAY FLYTE STAYCOOL (MISCELLANEOUS) ×6 IMPLANT
LINER NEUTRAL 54X36MM PLUS 4 (Hips) ×2 IMPLANT
NEEDLE HYPO 22GX1.5 SAFETY (NEEDLE) ×3 IMPLANT
PACK ANTERIOR HIP CUSTOM (KITS) ×3 IMPLANT
SROM M HEAD 36MM 2 (Hips) ×3 IMPLANT
STAPLER VISISTAT 35W (STAPLE) IMPLANT
STEM CORAIL KA12 (Stem) ×2 IMPLANT
STRIP CLOSURE SKIN 1/2X4 (GAUZE/BANDAGES/DRESSINGS) ×1 IMPLANT
SUT ETHIBOND NAB CT1 #1 30IN (SUTURE) ×6 IMPLANT
SUT MNCRL AB 3-0 PS2 18 (SUTURE) IMPLANT
SUT VIC AB 0 CT1 36 (SUTURE) ×6 IMPLANT
SUT VIC AB 1 CT1 36 (SUTURE) ×3 IMPLANT
SUT VIC AB 2-0 CT1 27 (SUTURE) ×2
SUT VIC AB 2-0 CT1 TAPERPNT 27 (SUTURE) ×1 IMPLANT
TRAY FOLEY MTR SLVR 16FR STAT (SET/KITS/TRAYS/PACK) ×3 IMPLANT
YANKAUER SUCT BULB TIP 10FT TU (MISCELLANEOUS) ×3 IMPLANT

## 2018-06-25 NOTE — Transfer of Care (Signed)
Immediate Anesthesia Transfer of Care Note  Patient: Keith Cunningham  Procedure(s) Performed: TOTAL HIP ARTHROPLASTY ANTERIOR APPROACH (Right Hip)  Patient Location: PACU  Anesthesia Type:Spinal  Level of Consciousness: awake, alert  and oriented  Airway & Oxygen Therapy: Patient Spontanous Breathing and Patient connected to face mask oxygen  Post-op Assessment: Report given to RN and Post -op Vital signs reviewed and stable  Post vital signs: Reviewed and stable  Last Vitals:  Vitals Value Taken Time  BP 114/66 06/25/2018  2:45 PM  Temp    Pulse 80 06/25/2018  2:45 PM  Resp 17 06/25/2018  2:45 PM  SpO2 100 % 06/25/2018  2:45 PM  Vitals shown include unvalidated device data.  Last Pain:  Vitals:   06/25/18 1034  TempSrc: Oral      Patients Stated Pain Goal: 4 (46/65/99 3570)  Complications: No apparent anesthesia complications

## 2018-06-25 NOTE — Anesthesia Postprocedure Evaluation (Signed)
Anesthesia Post Note  Patient: Keith Cunningham  Procedure(s) Performed: TOTAL HIP ARTHROPLASTY ANTERIOR APPROACH (Right Hip)     Patient location during evaluation: PACU Anesthesia Type: Spinal Level of consciousness: oriented and awake and alert Pain management: pain level controlled Vital Signs Assessment: post-procedure vital signs reviewed and stable Respiratory status: spontaneous breathing, respiratory function stable and patient connected to nasal cannula oxygen Cardiovascular status: blood pressure returned to baseline and stable Postop Assessment: no headache, no backache and no apparent nausea or vomiting Anesthetic complications: no    Last Vitals:  Vitals:   06/25/18 1500 06/25/18 1515  BP: 134/76 126/90  Pulse: 82 73  Resp: 19 15  Temp:    SpO2: 100% 100%    Last Pain:  Vitals:   06/25/18 1515  TempSrc:   PainSc: 0-No pain    LLE Motor Response: Purposeful movement (06/25/18 1515)   RLE Motor Response: Purposeful movement (06/25/18 1515)   L Sensory Level: S1-Sole of foot, small toes (06/25/18 1515) R Sensory Level: S1-Sole of foot, small toes (06/25/18 1515)  [place]

## 2018-06-25 NOTE — Plan of Care (Signed)
  Problem: Pain Management: Goal: Pain level will decrease with appropriate interventions Outcome: Progressing   Problem: Skin Integrity: Goal: Will show signs of wound healing Outcome: Progressing   Problem: Activity: Goal: Ability to avoid complications of mobility impairment will improve Outcome: Progressing   

## 2018-06-25 NOTE — Anesthesia Procedure Notes (Signed)
Procedure Name: MAC Date/Time: 06/25/2018 12:26 PM Performed by: Niel Hummer, CRNA Pre-anesthesia Checklist: Patient identified, Emergency Drugs available, Suction available and Patient being monitored Patient Re-evaluated:Patient Re-evaluated prior to induction Oxygen Delivery Method: Simple face mask

## 2018-06-25 NOTE — H&P (Signed)
TOTAL HIP ADMISSION H&P  Patient is admitted for right total hip arthroplasty.  Subjective:  Chief Complaint: right hip pain  HPI: Keith Cunningham, 78 y.o. male, has a history of pain and functional disability in the right hip(s) due to arthritis and patient has failed non-surgical conservative treatments for greater than 12 weeks to include NSAID's and/or analgesics, corticosteriod injections and activity modification.  Onset of symptoms was gradual starting 2 years ago with gradually worsening course since that time.The patient noted no past surgery on the right hip(s).  Patient currently rates pain in the right hip at 9 out of 10 with activity. Patient has night pain, worsening of pain with activity and weight bearing, trendelenberg gait, pain that interfers with activities of daily living and pain with passive range of motion. Patient has evidence of periarticular osteophytes and joint space narrowing by imaging studies. This condition presents safety issues increasing the risk of falls. This patient has had avascular necrosis of the hip, acetabular fracture, hip dysplasia.  There is no current active infection.  Patient Active Problem List   Diagnosis Date Noted  . Abscess of buttock 03/25/2017  . 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 Blindness of right eye 04/04/2015  . 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 Vertigo 03/23/2014  . Hyperlipidemia 03/23/2014  . 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 Abnormal stress test 05/06/2012  . Coronary atherosclerosis of native coronary artery 05/06/2012    Class: Acute  . Chest pain 04/22/2012  . Iron deficiency anemia 04/22/2012   Past Medical History:  Diagnosis Date  . Anemia   . Anginal pain ([place]) 04/21/2012  . Arthritis   . Bleeding internal hemorrhoids 2012  . Coronary artery disease   . H/O hiatal hernia   . History of blood transfusion 2012   "related to bleeding hemorrhoids"  (05/06/2012)  . 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 Hyperlipidemia   . 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 Myocardial infarction ([place]) 04/21/2012  . Pneumonia    "as a child" (05/06/2012)  . 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   Marland Kitchen Staph infection 03/2017   had I and D of infected abscess     Past Surgical History:  Procedure Laterality Date  . CARDIAC CATHETERIZATION  04/21/2012  . CATARACT EXTRACTION, BILATERAL Bilateral   . CORONARY ARTERY BYPASS GRAFT  05/10/2012   Procedure: CORONARY ARTERY BYPASS GRAFTING (CABG);  Surgeon: Grace Isaac, MD;  Location: [place];  Service: Open Heart Surgery;  Laterality: N/A;  times three using Left Greater Saphenous Vein Graft harvested endoscopically and Left Internal Mammary Artery  . EXCISIONAL HEMORRHOIDECTOMY  2012   "tried to shrink 1st w/needle; cut them out 2 wk later" (05/06/2012)  . INCISION AND DRAINAGE ABSCESS Right 03/25/2017   Procedure: INCISION AND DRAINAGE ABSCESS;  Surgeon: Jovita Kussmaul, MD;  [facility];  Service: General;  Laterality: Right;  . INTRAOPERATIVE TRANSESOPHAGEAL ECHOCARDIOGRAM  05/10/2012   Procedure: INTRAOPERATIVE TRANSESOPHAGEAL ECHOCARDIOGRAM;  Surgeon: Grace Isaac, MD;  Location: [place];  Service: Open Heart Surgery;  Laterality: N/A;  . MELANOMA EXCISION  2009   "left side of head" (05/06/2012)  . TONSILLECTOMY AND ADENOIDECTOMY  ~ 1950  . TOTAL HIP ARTHROPLASTY Left  05/26/2016   Procedure: TOTAL HIP ARTHROPLASTY ANTERIOR APPROACH;  Surgeon: Dorna Leitz, MD;  Location: [place];  Service: Orthopedics;  Laterality: Left;    Current Facility-Administered Medications  Medication Dose Route Frequency Provider Last Rate Last Dose  . bupivacaine liposome (EXPAREL) 1.3 % injection 133 mg  10 mL Infiltration Once Dorna Leitz, MD       Current Outpatient  Medications  Medication Sig Dispense Refill Last Dose  . acetaminophen (TYLENOL) 500 MG tablet Take 500 mg by mouth every 6 (six) hours as needed for moderate pain.   Taking  . aspirin EC 81 MG tablet Take 81 mg by mouth daily.   Taking  . Coenzyme Q10 (COQ10) 200 MG CAPS Take 200 mg by mouth daily.   Taking  . Omega-3 Fatty Acids (FISH OIL) 500 MG CAPS Take 500 mg by mouth 3 (three) times daily.      Allergies  Allergen Reactions  . Atorvastatin Nausea And Vomiting and Other (See Comments)    CONSTIPATION FATIGUE  . Statins Other (See Comments)    Myalgias   . Codeine Nausea And Vomiting    Social History   Tobacco Use  . Smoking status: Never Smoker  . Smokeless tobacco: Never Used  Substance Use Topics  . Alcohol use: No    Frequency: Never    Family History  Problem Relation Age of Onset  . Diabetes Mellitus II Mother   . Heart disease Mother   . Heart attack Father   . Diabetes Sister      ROS ROS: I have reviewed the patient's review of systems thoroughly and there are no positive responses as relates to the HPI. Objective:  Physical Exam  Vital signs in last 24 hours:   Well-developed well-nourished patient in no acute distress. Alert and oriented x3 HEENT:within normal limits Cardiac: Regular rate and rhythm Pulmonary: Lungs clear to auscultation Abdomen: Soft and nontender.  Normal active bowel sounds  Musculoskeletal: (Right hip: Limited range of motion.  Painful range of motion.  No instability.  Trace effusion.) Labs: Recent Results (from the past 2160 hour(s))  APTT     Status: Abnormal   Collection Time: 06/17/18  8:21 AM  Result Value Ref Range   aPTT 55 (H) 24 - 36 seconds    Comment:        IF BASELINE aPTT IS ELEVATED, SUGGEST PATIENT RISK ASSESSMENT BE USED TO DETERMINE APPROPRIATE ANTICOAGULANT THERAPY. Performed at [facility], [place] [address] [place], [place] [address]   CBC WITH DIFFERENTIAL     Status: None    Collection Time: 06/17/18  8:21 AM  Result Value Ref Range   WBC 5.3 4.0 - 10.5 K/uL   RBC 5.12 4.22 - 5.81 MIL/uL   Hemoglobin 14.8 13.0 - 17.0 g/dL   HCT 46.6 39.0 - 52.0 %   MCV 91.0 80.0 - 100.0 fL   MCH 28.9 26.0 - 34.0 pg   MCHC 31.8 30.0 - 36.0 g/dL   RDW 13.6 11.5 - 15.5 %   Platelets 221 150 - 400 K/uL   nRBC 0.0 0.0 - 0.2 %   Neutrophils Relative % 56 %   Neutro Abs 2.9 1.7 - 7.7 K/uL   Lymphocytes Relative 34 %   Lymphs Abs 1.8 0.7 - 4.0 K/uL   Monocytes Relative 9 %   Monocytes Absolute 0.5 0.1 - 1.0 K/uL   Eosinophils Relative 1 %   Eosinophils Absolute 0.1 0.0 - 0.5 K/uL   Basophils  Relative 0 %   Basophils Absolute 0.0 0.0 - 0.1 K/uL   Immature Granulocytes 0 %   Abs Immature Granulocytes 0.01 0.00 - 0.07 K/uL    Comment: Performed at [facility], [place] [address] [place], [place] [address]  Comprehensive metabolic panel     Status: Abnormal   Collection Time: 06/17/18  8:21 AM  Result Value Ref Range   Sodium 143 135 - 145 mmol/L   Potassium 5.3 (H) 3.5 - 5.1 mmol/L   Chloride 109 98 - 111 mmol/L   CO2 26 22 - 32 mmol/L   Glucose, Bld 95 70 - 99 mg/dL   BUN 17 8 - 23 mg/dL   Creatinine, Ser 0.89 0.61 - 1.24 mg/dL   Calcium 9.4 8.9 - 10.3 mg/dL   Total Protein 7.1 6.5 - 8.1 g/dL   Albumin 4.0 3.5 - 5.0 g/dL   AST 29 15 - 41 U/L   ALT 22 0 - 44 U/L   Alkaline Phosphatase 68 38 - 126 U/L   Total Bilirubin 1.1 0.3 - 1.2 mg/dL   GFR calc non Af Amer >60 >60 mL/min   GFR calc Af Amer >60 >60 mL/min   Anion gap 8 5 - 15    Comment: Performed at [facility], [place] [address] [place], [place] [address]  Protime-INR     Status: None   Collection Time: 06/17/18  8:21 AM  Result Value Ref Range   Prothrombin Time 13.2 11.4 - 15.2 seconds   INR 1.01     Comment: Performed at [facility], [place] [address], [place],  [address]  Type and screen Order type and screen if day of surgery is less than 15  days from draw of preadmission visit or order morning of surgery if day of surgery is greater than 6 days from preadmission visit.     Status: None   Collection Time: 06/17/18  8:21 AM  Result Value Ref Range   ABO/RH(D) O POS    Antibody Screen NEG    Sample Expiration 06/28/2018    Extend sample reason      NO TRANSFUSIONS OR PREGNANCY IN THE PAST 3 MONTHS Performed at [facility] [place] [address], [place],  [address]   Urinalysis, Routine w reflex microscopic     Status: Abnormal   Collection Time: 06/17/18  8:21 AM  Result Value Ref Range   Color, Urine STRAW (A) YELLOW   APPearance CLEAR CLEAR   Specific Gravity, Urine 1.006 1.005 - 1.030   pH 6.0 5.0 - 8.0   Glucose, UA NEGATIVE NEGATIVE mg/dL   Hgb urine dipstick SMALL (A) NEGATIVE   Bilirubin Urine NEGATIVE NEGATIVE   Ketones, ur NEGATIVE NEGATIVE mg/dL   Protein, ur NEGATIVE NEGATIVE mg/dL   Nitrite NEGATIVE NEGATIVE   Leukocytes, UA NEGATIVE NEGATIVE   RBC / HPF 0-5 0 - 5 RBC/hpf   WBC, UA 0-5 0 - 5 WBC/hpf   Bacteria, UA RARE (A) NONE SEEN   Mucus PRESENT     Comment: Performed at [facility], [place] [address] [place],  [address]  Surgical pcr screen     Status: Abnormal   Collection Time: 06/17/18  8:21 AM  Result Value Ref Range   MRSA, PCR NEGATIVE NEGATIVE   Staphylococcus aureus POSITIVE (A) NEGATIVE    Comment: (NOTE) The Xpert SA Assay (FDA approved for NASAL specimens in patients 20 years of age and older), is one  component of a comprehensive surveillance program. It is not intended to diagnose infection nor to guide or monitor treatment. Performed at [facility], [place] [address], [place], [place] [address]     Estimated body mass index is 25.29 kg/m as calculated from the following:   Height as of 06/17/18: 5\' 5"  (1.651 m).   Weight as of 06/17/18: 68.9 kg.   Imaging Review Plain radiographs demonstrate severe degenerative joint  disease of the right hip(s). The bone quality appears to be fair for age and reported activity level.    Preoperative templating of the joint replacement has been completed, documented, and submitted to the Operating Room personnel in order to optimize intra-operative equipment management.     Assessment/Plan:  End stage arthritis, right hip(s)  The patient history, physical examination, clinical judgement of the provider and imaging studies are consistent with end stage degenerative joint disease of the right hip(s) and total hip arthroplasty is deemed medically necessary. The treatment options including medical management, injection therapy, arthroscopy and arthroplasty were discussed at length. The risks and benefits of total hip arthroplasty were presented and reviewed. The risks due to aseptic loosening, infection, stiffness, dislocation/subluxation,  thromboembolic complications and other imponderables were discussed.  The patient acknowledged the explanation, agreed to proceed with the plan and consent was signed. Patient is being admitted for inpatient treatment for surgery, pain control, PT, OT, prophylactic antibiotics, VTE prophylaxis, progressive ambulation and ADL's and discharge planning.The patient is planning to be discharged home with home health services

## 2018-06-25 NOTE — Anesthesia Procedure Notes (Signed)
Spinal  Start time: 06/25/2018 12:30 PM End time: 06/25/2018 12:37 PM Staffing Resident/CRNA: Niel Hummer, CRNA Performed: resident/CRNA  Preanesthetic Checklist Completed: patient identified, surgical consent, pre-op evaluation, IV checked, risks and benefits discussed and monitors and equipment checked Spinal Block Patient position: sitting Prep: DuraPrep Patient monitoring: heart rate, continuous pulse ox and blood pressure Approach: midline Location: L2-3 Injection technique: single-shot Needle Needle type: Pencan  Needle gauge: 24 G Assessment Sensory level: T6

## 2018-06-25 NOTE — Op Note (Signed)
NAME: Keith Cunningham, CORBIT MEDICAL RECORD [id number] ACCOUNT [id number] DATE OF BIRTH:December 27, 1940 FACILITY: [facility] LOCATION: [facility] PHYSICIAN:Manasvi Dickard L. Kristian Mogg, MD  OPERATIVE REPORT  DATE OF PROCEDURE:  06/25/2018  PREOPERATIVE DIAGNOSIS:  End-stage degenerative joint disease, right hip, with severe bone-on-bone change.  POSTOPERATIVE DIAGNOSIS:  End-stage degenerative joint disease, right hip, with severe bone-on-bone change.  PROCEDURE:   1.  Right total hip replacement with a Corail stem size 12, 54 mm Pinnacle Gription cup, a +4 neutral liner, and a 36 mm -2 hip ball. 2.  Interpretation of multiple intraoperative fluoroscopic images.  SURGEON:  Dorna Leitz, MD  ASSISTANT:  Gaspar Skeeters PA-C, who was present for the entire case and assisted with retraction and closing to minimize OR time.  BRIEF HISTORY:  The patient is a 78 year old male with a long history of complaints of bilateral hip pain.  We did a left hip replacement on him and he did great with that.  He was having significant complaints of right hip pain.  After failure of all  conservative care, is taken to the operating room for right total hip replacement.  The patient had failed conservative care including activity modification, medication and physical therapy.  DESCRIPTION OF PROCEDURE:  The patient was taken to the operating room after adequate anesthesia obtained with a spinal anesthetic.  The patient was brought to the operating table.  Right leg was prepped and draped in usual sterile fashion after bony  prominences being well padded and a mean placed on the Hana bed and boots.  Following this, an incision was made for an anterior approach to the hip subcutaneous to the level of the tensor fascia.  Tensor fascia was divided in line with its fibers.  The  muscle was finger dissected and retractors put in place above and below the hip.  The hip capsule was opened and tagged.  The provisional neck cut was then made and the  head was removed.  Following this, retractors put in place above and below the  acetabulum.  Acetabulum was sequentially reamed to a level of 53 mm.  A 54 mm Gription cup is open and hammered into place 45 degrees of lateral opening, 30 degrees of anteversion.  Once this was done, attention was turned to the stem side where after  release of the posterior capsule the stem was opened with a cookie cutter followed by a chili pepper followed by sequential rasping up to a level of 12.  We hammered this into place.  It got an excellent tight fit.  We calcar planed it and put a +0 ball  on and trialed it.  It was a little touch long.  At that point, we went back with the 12.  We countersunk it a little bit more.  We calcar planed a little bit more.  We used a minus ball and that got him pretty symmetric in terms of leg length.  At this  point, the trial rasp was removed.  The final was opened and placed and following this, the stem was placed and a -2 ball was placed and reduced.  Final x-rays were taken.  Excellent reduction was achieved.  Excellent appearance of the stem at this  point.  The wound was irrigated, suctioned dry.  The final images taken at this point and recorded.  The anterior capsule was closed with #1 Vicryl running.  The tensor fascia was then closed with 0 Vicryl running, the skin with 0 and 2-0 Vicryl and 3-0  Monocryl  subcuticular.  Benzoin, Steri-Strips, dry sterile compressive dressing applied.  The patient was taken to recovery was noted to be in satisfactory condition.  Of note, Gaspar Skeeters was present for the entire case and assisted with the entire case  including traction and closing to minimize OR time.  The multiple intraoperative fluoroscopic images were taken and interpreted by me to assess leg length and adequacy of fit as well as angles with the acetabulum.  Estimated blood loss for procedure was  about 300 mL.  The final can be gotten from the anesthetic record.  TN/NUANCE   D:06/25/2018 T:06/25/2018 JOB:[id number]

## 2018-06-25 NOTE — Brief Op Note (Signed)
06/25/2018  2:46 PM  PATIENT:  Keith Cunningham  78 y.o. male  PRE-OPERATIVE DIAGNOSIS:  RIGHT HIP OSTEOARTHRITIS  POST-OPERATIVE DIAGNOSIS:  RIGHT HIP OSTEOARTHRITIS  PROCEDURE:  Procedure(s): TOTAL HIP ARTHROPLASTY ANTERIOR APPROACH (Right)  SURGEON:  Surgeon(s) and Role:    Dorna Leitz, MD - Primary  PHYSICIAN ASSISTANT:   ASSISTANTS: jikm berthune   ANESTHESIA:   spinal  EBL:  275 mL   BLOOD ADMINISTERED:none  DRAINS: none   LOCAL MEDICATIONS USED:  MARCAINE    and OTHER experel   SPECIMEN:  No Specimen  DISPOSITION OF SPECIMEN:  N/A  COUNTS:  YES  TOURNIQUET:  * No tourniquets in log *  DICTATION: .Other Dictation: Dictation Number [id number]  PLAN OF CARE: Admit to inpatient   PATIENT DISPOSITION:  PACU - hemodynamically stable.   Delay start of Pharmacological VTE agent (>24hrs) due to surgical blood loss or risk of bleeding: no

## 2018-06-25 NOTE — Evaluation (Signed)
Physical Therapy Evaluation Patient Details Name: Keith Cunningham MRN: [id number] DOB: 1941-04-22 Today's Date: 06/25/2018   History of Present Illness  78 yo male s/p R DA-THA on 06/25/18. 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   Clinical Impression   Pt presents with mild R hip pain, post-operative R hip weakness, increased time and effort to perform mobility tasks. Pt to benefit from acute PT to address deficits. Pt ambulated 69 ft with RW with min guard assist, with VC. Pt highly motivated to progress mobility. Pt educated on ankle pumps (20/hour) to perform this afternoon/evening to increase circulation, to pt's tolerance and limited by pain. PT to progress mobility as tolerated, and will continue to follow acutely.        Follow Up Recommendations Follow surgeon's recommendation for DC plan and follow-up therapies;Supervision for mobility/OOB    Equipment Recommendations  None recommended by PT    Recommendations for Other Services       Precautions / Restrictions Precautions Precautions: Fall Restrictions Weight Bearing Restrictions: No Other Position/Activity Restrictions: WBAT       Mobility  Bed Mobility Overal bed mobility: Needs Assistance Bed Mobility: Supine to Sit     Supine to sit: Min guard;HOB elevated     General bed mobility comments: Min guard for safety. Verbal cuing for sequencing, increased time and effort.   Transfers Overall transfer level: Needs assistance Equipment used: Rolling walker (2 wheeled) Transfers: Sit to/from Stand Sit to Stand: Min guard;From elevated surface         General transfer comment: Min guard for rising, min assist for steadying upon standing. Verbal cuing for hand placement. Pt highly motivated to ambulate.   Ambulation/Gait Ambulation/Gait assistance: Min guard Gait Distance (Feet): 65 Feet Assistive device:  Rolling walker (2 wheeled) Gait Pattern/deviations: Step-to pattern;Decreased stance time - right;Decreased weight shift to right;Antalgic Gait velocity: decr    General Gait Details: Min guard for safety. Verbal cuing for sequencing, placement in RW, posture.   Stairs            Wheelchair Mobility    Modified Rankin (Stroke Patients Only)       Balance Overall balance assessment: Mild deficits observed, not formally tested                                           Pertinent Vitals/Pain Pain Assessment: 0-10 Pain Score: 2  Pain Location: R hip  Pain Descriptors / Indicators: Sore Pain Intervention(s): Limited activity within patient's tolerance;Repositioned;Monitored during session;Premedicated before session;Ice applied    Home Living Family/patient expects to be discharged to:: Private residence Living Arrangements: Spouse/significant other Available Help at Discharge: Family;Available 24 hours/day Type of Home: House Home Access: Stairs to enter Entrance Stairs-Rails: None Entrance Stairs-Number of Steps: 1 Home Layout: One level Home Equipment: Walker - 2 wheels;Cane - single point;Bedside commode      Prior Function Level of Independence: Independent               Hand Dominance   Dominant Hand: Right    Extremity/Trunk Assessment   Upper Extremity Assessment Upper Extremity Assessment: Overall WFL for tasks assessed    Lower Extremity Assessment Lower Extremity Assessment: Overall WFL for tasks assessed;RLE deficits/detail RLE Deficits / Details: suspected post-surgical hip weakness  RLE Sensation: decreased light touch;WNL(decreased sensation of R gluteal region, feet felt tingly)    Cervical / Trunk Assessment Cervical / Trunk Assessment: Kyphotic  Communication   Communication: No difficulties  Cognition Arousal/Alertness: Awake/alert Behavior During Therapy: WFL for tasks assessed/performed Overall Cognitive  Status: Within Functional Limits for tasks assessed                                 General Comments: pt pleasant and talkative      General Comments      Exercises     Assessment/Plan    PT Assessment Patient needs continued PT services  PT Problem List Decreased strength;Pain;Decreased activity tolerance;Decreased knowledge of use of DME;Decreased balance;Decreased mobility;Decreased safety awareness       PT Treatment Interventions DME instruction;Therapeutic activities;Gait training;Patient/family education;Therapeutic exercise;Stair training;Balance training;Functional mobility training    PT Goals (Current goals can be found in the Care Plan section)  Acute Rehab PT Goals Patient Stated Goal: go home PT Goal Formulation: With patient Time For Goal Achievement: 07/02/18 Potential to Achieve Goals: Good    Frequency 7X/week   Barriers to discharge        Co-evaluation               AM-PAC PT "6 Clicks" Mobility  Outcome Measure Help needed turning from your back to your side while in a flat bed without using bedrails?: A Little Help needed moving from lying on your back to sitting on the side of a flat bed without using bedrails?: A Little Help needed moving to and from a bed to a chair (including a wheelchair)?: A Little Help needed standing up from a chair using your arms (e.g., wheelchair or bedside chair)?: A Little Help needed to walk in hospital room?: A Little Help needed climbing 3-5 steps with a railing? : A Little 6 Click Score: 18    End of Session Equipment Utilized During Treatment: Gait belt Activity Tolerance: Patient tolerated treatment well Patient left: in chair;with call bell/phone within reach;with SCD's reapplied Nurse Communication: Mobility status PT Visit Diagnosis: Other abnormalities of gait and mobility (R26.89);Difficulty in walking, not elsewhere classified (R26.2)    Time: 4259-5638 PT Time Calculation (min)  (ACUTE ONLY): 22 min   Charges:   PT Evaluation $PT Eval Low Complexity: 1 Low         Julien Girt, PT Acute Rehabilitation Services Pager [phone]  Office [phone]  Roxine Caddy D Elonda Husky 06/25/2018, 7:31 PM

## 2018-06-26 LAB — CBC
HCT: 40.3 % (ref 39.0–52.0)
Hemoglobin: 12.7 g/dL — ABNORMAL LOW (ref 13.0–17.0)
MCH: 28.9 pg (ref 26.0–34.0)
MCHC: 31.5 g/dL (ref 30.0–36.0)
MCV: 91.8 fL (ref 80.0–100.0)
Platelets: 202 10*3/uL (ref 150–400)
RBC: 4.39 MIL/uL (ref 4.22–5.81)
RDW: 13.5 % (ref 11.5–15.5)
WBC: 12.9 10*3/uL — ABNORMAL HIGH (ref 4.0–10.5)
nRBC: 0 % (ref 0.0–0.2)

## 2018-06-26 NOTE — Care Management Note (Signed)
Case Management Note  Patient Details  Name: Keith Cunningham MRN: [id number] Date of Birth: 02/02/1941  Subjective/Objective:   S/p R DA THA                 Action/Plan: NCM spoke to pt and offered choice for HH/CMS list placed on chart. Pt states he has RW and 3n1 bedside commode at home. Pt was preoperatively arranged with [facility] for HHPT.   Expected Discharge Date:  06/26/18               Expected Discharge Plan:  [place]  In-House Referral:  NA  Discharge planning Services  CM Consult  Post Acute Care Choice:  Home Health Choice offered to:  Patient  DME Arranged:  N/A DME Agency:  NA  HH Arranged:  PT [place] Agency:  [facility] (formerly [employer])  Status of Service:  Completed, signed off  If discussed at [employer], dates discussed:    Additional Comments:  Erenest Rasher, RN 06/26/2018, 2:05 PM

## 2018-06-26 NOTE — Progress Notes (Signed)
    Patient doing well PO day 1, minimal pain, has been up and walking, eating and urinating well. + passing gas. Without complaint   Physical Exam: Vitals:   06/26/18 0058 06/26/18 0335  BP: 131/85 (!) 143/96  Pulse: 71 86  Resp: 16 16  Temp: 98.1 F (36.7 C) 97.7 F (36.5 C)  SpO2: 97% 97%    Dressing in place, CDI, ice packs in place, pt resting comfortably in bed, distal compartments soft, 2 + DPP = BIL NVI  POD #1 s/p R ANT THA per Dr Berenice Primas team   - up with PT/OT, encourage ambulation - Cont px meds, scripts printed and in chart for D/C - ASA 325 BID for DVT prophylaxis  - d/c home today after PT with f/u in 2 weeks

## 2018-06-26 NOTE — Progress Notes (Signed)
Physical Therapy Treatment Patient Details Name: Keith Cunningham MRN: [id number] DOB: [birth year] Today's Date: 06/26/2018    History of Present Illness 78 yo male s/p R DA-THA on 06/25/18. 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     PT Comments    POD # 1 am session Spouse present during session.  Demonstrated and instructed pt how to use belt to self assist LE as a leg lifter Assisted OOB to amb. Practiced stairs. Then returned to room to Performed some TE's following HEP handout.  Instructed on proper tech, freq as well as use of ICE.  Marland Kitchen  Addressed all mobility questions, discussed appropriate activity, educated on use of ICE.  Pt ready for D/C to home.   Follow Up Recommendations        Equipment Recommendations       Recommendations for Other Services       Precautions / Restrictions Precautions Precautions: Fall Restrictions Weight Bearing Restrictions: No Other Position/Activity Restrictions: WBAT     Mobility  Bed Mobility Overal bed mobility: Needs Assistance Bed Mobility: Supine to Sit;Sit to Supine     Supine to sit: Supervision Sit to supine: Supervision   General bed mobility comments: demonstarted and instructed how to use a belt to self assist LE on/off bed   Transfers Overall transfer level: Needs assistance Equipment used: Rolling walker (2 wheeled) Transfers: Sit to/from [employer] Transfers Sit to Stand: Supervision Stand pivot transfers: Supervision       General transfer comment: 25% VC's on proper hand placement and safety with turns   Ambulation/Gait Ambulation/Gait assistance: Supervision Gait Distance (Feet): 125 Feet Assistive device: Rolling walker (2 wheeled) Gait Pattern/deviations: Step-to pattern;Decreased stance time - right;Decreased weight shift to right;Antalgic Gait velocity: decreased    General Gait Details: <25%  VC's on proper walker to self distance and safety    Stairs Stairs: Yes Stairs assistance: Supervision;Min guard Stair Management: No rails;Step to pattern;With walker;Forwards Number of Stairs: 3 General stair comments: with spouse assisting "hands on" with 25% VC's on proper tech    Wheelchair Mobility    Modified Rankin (Stroke Patients Only)       Balance                                            Cognition Arousal/Alertness: Awake/alert Behavior During Therapy: WFL for tasks assessed/performed Overall Cognitive Status: Within Functional Limits for tasks assessed                                        Exercises   Total Hip Replacement TE's 10 reps ankle pumps 10 reps knee presses 10 reps heel slides 10 reps SAQ's 10 reps ABD Followed by ICE     General Comments        Pertinent Vitals/Pain Pain Assessment: 0-10 Pain Score: 5  Pain Location: R hip  Pain Descriptors / Indicators: Sore;Tender;Operative site guarding Pain Intervention(s): Monitored during session;Repositioned;Ice applied    Home Living                      Prior Function  PT Goals (current goals can now be found in the care plan section) Progress towards PT goals: Progressing toward goals    Frequency    7X/week      PT Plan Current plan remains appropriate    Co-evaluation              AM-PAC PT "6 Clicks" Mobility   Outcome Measure                   End of Session Equipment Utilized During Treatment: Gait belt Activity Tolerance: Patient tolerated treatment well Patient left: with call bell/phone within reach;with SCD's reapplied;in bed Nurse Communication: (pt ready for D/C to home ) PT Visit Diagnosis: Other abnormalities of gait and mobility (R26.89);Difficulty in walking, not elsewhere classified (R26.2)     Time: 4128-7867 PT Time Calculation (min) (ACUTE ONLY): 40 min  Charges:  $Gait  Training: 8-22 mins $Therapeutic Exercise: 8-22 mins $Self Care/Home Management: 8-22                     {Mohammed Mcandrew  PTA Acute  Rehabilitation Services Pager      [phone] Office      [phone]

## 2018-06-29 ENCOUNTER — Encounter (HOSPITAL_COMMUNITY): Payer: Self-pay | Admitting: Orthopedic Surgery

## 2018-07-01 NOTE — Discharge Summary (Signed)
Patient ID: Keith Cunningham MRN: [id number] DOB/AGE: [birth year] 78 y.o.  Admit date: 06/25/2018 Discharge date: 06/26/2018  Admission Diagnoses:  Principal Problem:   Primary osteoarthritis of right hip   Discharge Diagnoses:  Same  Past Medical History:  Diagnosis Date  . Anemia   . Anginal pain ([place]) 04/21/2012  . Arthritis   . Bleeding internal hemorrhoids 2012  . Coronary artery disease   . H/O hiatal hernia   . History of blood transfusion 2012   "related to bleeding hemorrhoids" (05/06/2012)  . 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 Hyperlipidemia   . 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 Myocardial infarction ([place]) 04/21/2012  . Pneumonia    "as a child" (05/06/2012)  . 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   Marland Kitchen Staph infection 03/2017   had I and D of infected abscess     Surgeries: Procedure(s): TOTAL HIP ARTHROPLASTY ANTERIOR APPROACH on 06/25/2018   Consultants: None  Discharged Condition: Improved  Hospital Course: SEON GAERTNER is an 78 y.o. male who was admitted 06/25/2018 for operative treatment of Primary osteoarthritis of right hip. Patient has severe unremitting pain that affects sleep, daily activities, and work/hobbies. After pre-op clearance the patient was taken to the operating room on 06/25/2018 and underwent  Procedure(s): TOTAL HIP ARTHROPLASTY ANTERIOR APPROACH.    Patient was given perioperative antibiotics:  Anti-infectives (From admission, onward)   Start     Dose/Rate Route Frequency Ordered Stop   06/25/18 1800  ceFAZolin (ANCEF) IVPB 2g/100 mL premix     2 g 200 mL/hr over 30 Minutes Intravenous Every 6 hours 06/25/18 1620 06/26/18 0040   06/25/18 1030  ceFAZolin (ANCEF) IVPB 2g/100 mL premix     2 g 200 mL/hr over 30 Minutes Intravenous On call to O.R. 06/25/18 1025 06/25/18 1307       Patient was given sequential compression devices, early ambulation to prevent DVT.  Patient benefited maximally from hospital stay and there were no complications.    Recent vital signs: BP 119/76   Pulse 80   Temp (!) 97.5 F (36.4 C) (Oral)   Resp 18   Ht 5\' 5"  (1.651 m)   Wt 68.9 kg   SpO2 94%   BMI 25.29 kg/m   Discharge Medications:   Allergies as of 06/26/2018      Reactions   Atorvastatin Nausea And Vomiting, Other (See Comments)   CONSTIPATION FATIGUE   Statins Other (See Comments)   Myalgias   Codeine Nausea And Vomiting      Medication List    TAKE these medications   acetaminophen 500 MG tablet Commonly known as:  TYLENOL Take 500 mg by mouth every 6 (six) hours as needed for moderate pain.   aspirin EC 325 MG tablet Take 1 tablet (325 mg total) by mouth 2 (two) times daily after a meal. Take x 1 month post op to decrease risk of blood clots. What changed:    medication strength  how much to take  when to take this  additional instructions   CoQ10 200 MG Caps Take 200 mg by mouth daily.   docusate sodium 100 MG capsule Commonly known as:  COLACE Take 1 capsule (100 mg total) by mouth 2 (two) times daily.   Fish  Oil 500 MG Caps Take 500 mg by mouth 3 (three) times daily.   oxyCODONE-acetaminophen 5-325 MG tablet Commonly known as:  PERCOCET/ROXICET Take 1-2 tablets by mouth every 6 (six) hours as needed for severe pain.   tiZANidine 2 MG tablet Commonly known as:  ZANAFLEX Take 1 tablet (2 mg total) by mouth every 8 (eight) hours as needed for muscle spasms.       Diagnostic Studies: Dg Chest 2 View  Result Date: 06/17/2018 CLINICAL DATA:  Patient with history of myocardial infarction 6 years prior. EXAM: CHEST - 2 VIEW COMPARISON:  Chest radiograph 05/20/2016 FINDINGS: Patient status post median sternotomy. Stable cardiac and mediastinal contours. No consolidative pulmonary opacities. No pleural effusion or pneumothorax. Thoracic  spine degenerative changes. IMPRESSION: No acute cardiopulmonary process. Electronically Signed   By: Lovey Newcomer M.D.   On: 06/17/2018 08:44   Dg C-arm 1-60 Min-no Report  Result Date: 06/25/2018 Fluoroscopy was utilized by the requesting physician.  No radiographic interpretation.   Dg Hip Operative Unilat W Or W/o Pelvis Right  Result Date: 06/25/2018 CLINICAL DATA:  Hip surgery EXAM: OPERATIVE right HIP (WITH PELVIS IF PERFORMED) 2 VIEWS TECHNIQUE: Fluoroscopic spot image(s) were submitted for interpretation post-operatively. COMPARISON:  03/31/2016 FINDINGS: Right hip replacement in satisfactory position alignment. No acute complication. IMPRESSION: Satisfactory right hip replacement Electronically Signed   By: Franchot Gallo M.D.   On: 06/25/2018 14:42    Disposition:    POD #1 s/p R ANT THA per Dr Berenice Primas team   - up with PT/OT, encourage ambulation - Cont px meds, scripts printed and in chart for D/C - ASA 325 BID for DVT prophylaxis  -Written scripts for pain signed and in chart -D/C instructions sheet printed and in chart -D/C today  -F/U in office 2 weeks   Signed: Lennie Muckle Josslyn Ciolek 07/01/2018, 7:47 AM

## 2018-12-09 ENCOUNTER — Other Ambulatory Visit: Payer: Self-pay

## 2018-12-09 ENCOUNTER — Telehealth: Payer: Self-pay | Admitting: Neurology

## 2018-12-09 ENCOUNTER — Ambulatory Visit: Payer: Medicare Other | Admitting: Neurology

## 2018-12-09 NOTE — Telephone Encounter (Signed)
Patient was worked in today for an appointment at the request of Dr. Delman Cheadle.  Patient came in today and was upset that wife could not come to the back with him (current [facility] policy due to pandemic).  Patient has no dementia.  Patient stated that he would leave if his wife could not come back with him.  Declined Evisit previously, as wanted to be seen in person.  Patient stated that he would return to Dr. Delman Cheadle for referral elsewhere.  I did contact Dr. Delman Cheadle and let him know that he would need to refer the patient elsewhere, at patient's request.

## 2018-12-20 ENCOUNTER — Encounter: Payer: Self-pay | Admitting: Neurology

## 2018-12-27 ENCOUNTER — Telehealth: Payer: Self-pay | Admitting: Neurology

## 2018-12-27 NOTE — Telephone Encounter (Signed)
Patient dismissed from [facility] by Wells Guiles Tat DO, effective 12/20/18. Dismissal Letter sent out by 1st class mail. KLM

## 2019-01-10 ENCOUNTER — Encounter: Payer: Self-pay | Admitting: Neurology

## 2019-01-10 ENCOUNTER — Ambulatory Visit (INDEPENDENT_AMBULATORY_CARE_PROVIDER_SITE_OTHER): Payer: Medicare Other | Admitting: Neurology

## 2019-01-10 ENCOUNTER — Other Ambulatory Visit: Payer: Self-pay

## 2019-01-10 ENCOUNTER — Telehealth: Payer: Self-pay | Admitting: Neurology

## 2019-01-10 VITALS — BP 148/64 | HR 85 | Temp 98.0°F | Ht 65.0 in | Wt 151.5 lb

## 2019-01-10 DIAGNOSIS — G250 Essential tremor: Secondary | ICD-10-CM

## 2019-01-10 DIAGNOSIS — R42 Dizziness and giddiness: Secondary | ICD-10-CM

## 2019-01-10 DIAGNOSIS — H90A21 Sensorineural hearing loss, unilateral, right ear, with restricted hearing on the contralateral side: Secondary | ICD-10-CM | POA: Diagnosis not present

## 2019-01-10 DIAGNOSIS — H547 Unspecified visual loss: Secondary | ICD-10-CM

## 2019-01-10 MED ORDER — METOPROLOL SUCCINATE ER 25 MG PO TB24: 25.0000 mg | ORAL_TABLET | Freq: Every day | ORAL | 11 refills | Status: DC

## 2019-01-10 NOTE — Telephone Encounter (Signed)
[employer] medicare order sent to GI. No auth they will reach out to the patient to schedule.  
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
## 2019-01-25 ENCOUNTER — Ambulatory Visit: Payer: Medicare Other | Admitting: Neurology

## 2019-02-03 ENCOUNTER — Other Ambulatory Visit: Payer: Self-pay

## 2019-02-03 ENCOUNTER — Ambulatory Visit
Admission: RE | Admit: 2019-02-03 | Discharge: 2019-02-03 | Disposition: A | Discharge status: Home / Self Care | Payer: Medicare Other | Source: Ambulatory Visit | Attending: Neurology | Admitting: Neurology

## 2019-02-03 DIAGNOSIS — H90A21 Sensorineural hearing loss, unilateral, right ear, with restricted hearing on the contralateral side: Secondary | ICD-10-CM | POA: Diagnosis not present

## 2019-02-03 DIAGNOSIS — R42 Dizziness and giddiness: Secondary | ICD-10-CM

## 2019-02-03 DIAGNOSIS — H547 Unspecified visual loss: Secondary | ICD-10-CM

## 2019-02-03 MED ORDER — GADOBENATE DIMEGLUMINE 529 MG/ML IV SOLN
14.0000 mL | Freq: Once | INTRAVENOUS | Status: AC | PRN
  Administered 2019-02-03: 14 mL via INTRAVENOUS

## 2019-02-07 ENCOUNTER — Telehealth: Payer: Self-pay | Admitting: *Deleted

## 2019-02-07 NOTE — Telephone Encounter (Signed)
-----   Message from Britt Bottom, MD sent at 02/05/2019 12:07 PM EDT ----- Please let her know that the MRI of the brain and orbits show age-related changes in the brain but nothing that would be expected to affect vision or dizziness or hearing loss.

## 2019-02-07 NOTE — Telephone Encounter (Signed)
Called and spoke with pt. Relayed results per Dr. Felecia Shelling note. Advised him to keep f/u for 05/19/19 and to call before then if he has any new or worsening sx. He verbalized understanding.

## 2019-05-17 ENCOUNTER — Telehealth: Payer: Self-pay | Admitting: Interventional Cardiology

## 2019-05-17 NOTE — Telephone Encounter (Signed)
Spoke with the pts wife Keith Cunningham (on [place]) and she was inquiring if it would be ok for her to accompany the pt to his appt with Dr. Tamala Julian on 12/4. Wife states the pt is completely deaf in one ear, and the other ear he can hear at 40%.  Wife also states he is memory impaired.  Informed the pts wife that she may accompany the pt up to his visit with Dr. Tamala Julian, and I will update this in appt notes.  Advised the pts wife that both her and the pt must wear their mask to this visit, and keep this on during the entire duration of the visit, even when waiting for the Provider to come into the room. Wife verbalized understanding and agrees with this plan.

## 2019-05-17 NOTE — Telephone Encounter (Signed)
Patient's wife called stating she will be coming with patient to his appointment on Friday 05/20/19. She states he is deaf in one ear and the other ear only has 40% hearing.

## 2019-05-18 NOTE — Progress Notes (Signed)
Cardiology Office Note:    Date:  05/20/2019   ID:  Keith Cunningham, DOB [birth year], MRN [id number]  PCP:  Aurea Graff.Marlou Sa, MD  Cardiologist:  Sinclair Grooms, MD   Referring MD: Aurea Graff.Marlou Sa, MD   Chief Complaint  Patient presents with  . Coronary Artery Disease    History of Present Illness:    Keith Cunningham is a 78 y.o. male with a hx of coronary artery disease with prior sequential SVG to OM1 and OM 2, LIMA to LAD, and SVG to PDAin 2013, hyperlipidemia,  Keith Cunningham denies complaints.  He denies claudication.  He has not had angina.  He has not been troubled by shortness of breath, peripheral edema, palpitations, or syncope.  Again, we spent significant time discussing the protective effects of cholesterol lowering.  I have not done a lipid panel on him in several years due to his prior refusal to use medical therapy for prevention.  We tried atorvastatin and after 1 to 2 days he says nausea and vomiting were occurring and stopped taking it.  This was over 4 years ago.  Most recent LDL was 112 with a total cholesterol of 191.  Past Medical History:  Diagnosis Date  . Anemia   . Anginal pain ([place]) 04/21/2012  . Arthritis   . Bleeding internal hemorrhoids 2012  . Coronary artery disease   . H/O hiatal hernia   . History of blood transfusion 2012   "related to bleeding hemorrhoids" (05/06/2012)  . 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 Hyperlipidemia   . Melanoma of face ([place]) 2009   left temple  . Myocardial infarction ([place]) 04/21/2012  . Pneumonia    "as a child" (05/06/2012)  . 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   Marland Kitchen Staph infection 03/2017   had I and D of infected abscess     Past Surgical History:  Procedure Laterality Date  . CARDIAC CATHETERIZATION  04/21/2012  . CATARACT EXTRACTION, BILATERAL Bilateral   .  CORONARY ARTERY BYPASS GRAFT  05/10/2012   Procedure: CORONARY ARTERY BYPASS GRAFTING (CABG);  Surgeon: Grace Isaac, MD;  Location: [place];  Service: Open Heart Surgery;  Laterality: N/A;  times three using Left Greater Saphenous Vein Graft harvested endoscopically and Left Internal Mammary Artery  . EXCISIONAL HEMORRHOIDECTOMY  2012   "tried to shrink 1st w/needle; cut them out 2 wk later" (05/06/2012)  . INCISION AND DRAINAGE ABSCESS Right 03/25/2017   Procedure: INCISION AND DRAINAGE ABSCESS;  Surgeon: Jovita Kussmaul, MD;  [facility];  Service: General;  Laterality: Right;  . INTRAOPERATIVE TRANSESOPHAGEAL ECHOCARDIOGRAM  05/10/2012   Procedure: INTRAOPERATIVE TRANSESOPHAGEAL ECHOCARDIOGRAM;  Surgeon: Grace Isaac, MD;  Location: [place];  Service: Open Heart Surgery;  Laterality: N/A;  . MELANOMA EXCISION  2009   "left side of head" (05/06/2012)  . TONSILLECTOMY AND ADENOIDECTOMY  ~ 1950  . TOTAL HIP ARTHROPLASTY Left 05/26/2016   Procedure: TOTAL HIP ARTHROPLASTY ANTERIOR APPROACH;  Surgeon: Dorna Leitz, MD;  Location: [place];  Service: Orthopedics;  Laterality: Left;  . TOTAL HIP ARTHROPLASTY Right 06/25/2018   Procedure: TOTAL HIP ARTHROPLASTY ANTERIOR APPROACH;  Surgeon: Dorna Leitz, MD;  [facility];  Service: Orthopedics;  Laterality: Right;    Current Medications: Current Meds  Medication Sig  . acetaminophen (  TYLENOL) 500 MG tablet Take 500 mg by mouth every 6 (six) hours as needed for moderate pain.  Marland Kitchen aspirin (ASPIRIN 81) 81 MG EC tablet Take 1 tablet by mouth daily.   . Coenzyme Q10 (COQ10) 200 MG CAPS Take 200 mg by mouth daily.  . metoprolol succinate (TOPROL XL) 25 MG 24 hr tablet Take 1 tablet (25 mg total) by mouth daily.  . Omega-3 Fatty Acids (FISH OIL) 500 MG CAPS Take 500 mg by mouth daily.      Allergies:   Atorvastatin, Statins, and Codeine   Social History   Socioeconomic History  . Marital status: Married    Spouse name: Not on file  .  Number of children: Not on file  . Years of education: Not on file  . Highest education level: Not on file  Occupational History  . Occupation: retired    Comment: Geographical information systems officer  . Financial resource strain: Not on file  . Food insecurity    Worry: Not on file    Inability: Not on file  . Transportation needs    Medical: Not on file    Non-medical: Not on file  Tobacco Use  . Smoking status: Never Smoker  . Smokeless tobacco: Never Used  Substance and Sexual Activity  . Alcohol use: No    Frequency: Never  . Drug use: No  . Sexual activity: Yes  Lifestyle  . Physical activity    Days per week: Not on file    Minutes per session: Not on file  . Stress: Not on file  Relationships  . Social Herbalist on phone: Not on file    Gets together: Not on file    Attends religious service: Not on file    Active member of club or organization: Not on file    Attends meetings of clubs or organizations: Not on file    Relationship status: Not on file  Other Topics Concern  . Not on file  Social History Narrative   Lives with wife   Caffeine use: Tea: 2 glasses per day (8 oz_   Coffee: every morning    Right handed      Family History: The patient's family history includes Diabetes in his sister; Diabetes Mellitus II in his mother; Heart attack in his father; Heart disease in his mother.  ROS:   Please see the history of present illness.    Recent right hip replacement.  Left hip replacement 2017.  Still has stiffness when he stands.  He is playing golf.  He is getting more than 150 minutes of moderate activity per week.  He has developed a tremor.  His physician is recommended metoprolol which she has not started until he speaks with me concerning potential cardiac side effects.  All other systems reviewed and are negative.  EKGs/Labs/Other Studies Reviewed:    The following studies were reviewed today: No new data1  EKG:  EKG normal sinus rhythm, left axis  deviation, incomplete right bundle possible posterior infarct.  No change when compared to 04/13/2018.  Recent Labs: 06/17/2018: ALT 22; BUN 17; Creatinine, Ser 0.89; Potassium 5.3; Sodium 143 06/26/2018: Hemoglobin 12.7; Platelets 202  Recent Lipid Panel    Component Value Date/Time   CHOL 191 04/04/2015 0908   TRIG 133 04/04/2015 0908   HDL 52 04/04/2015 0908   CHOLHDL 3.7 04/04/2015 0908   VLDL 27 04/04/2015 0908   LDLCALC 112 04/04/2015 0908    Physical Exam:  VS:  BP 122/74   Pulse 66   Ht 5\' 5"  (1.651 m)   Wt 152 lb 1.9 oz (69 kg)   SpO2 97%   BMI 25.31 kg/m     Wt Readings from Last 3 Encounters:  05/20/19 152 lb 1.9 oz (69 kg)  05/19/19 154 lb 8 oz (70.1 kg)  01/10/19 151 lb 8 oz (68.7 kg)     GEN: Compatible with age. No acute distress HEENT: Normal NECK: No JVD. LYMPHATICS: No lymphadenopathy CARDIAC:  RRR without murmur, gallop, or edema. VASCULAR:  Normal Pulses. No bruits. RESPIRATORY:  Clear to auscultation without rales, wheezing or rhonchi  ABDOMEN: Soft, non-tender, non-distended, No pulsatile mass, MUSCULOSKELETAL: No deformity  SKIN: Warm and dry NEUROLOGIC:  Alert and oriented x 3 PSYCHIATRIC:  Normal affect   ASSESSMENT:    1. Atherosclerosis of native coronary artery of native heart with stable angina pectoris ([place])   2. Other hyperlipidemia   3. Elevated hemoglobin A1c   4. S/P CABG (coronary artery bypass graft)   5. Educated about COVID-19 virus infection    PLAN:    In order of problems listed above:  1. Secondary prevention discussed in great detail as outlined below.  Major concentration was lipid-lowering. 2. Crestor 5 mg/day.  Lipid panel in 6 to [redacted] weeks along with liver panel. 3. Adequate blood pressure control. 4. He is active enough that I do not feel functional testing is necessary especially in absence of symptoms. 5. The 3 W's is discussed and endorsed by the patient is lifestyle changes to avoid COVID-19 infection.   .Overall education and awareness concerning primary/secondary risk prevention was discussed in detail: LDL less than 70, hemoglobin A1c less than 7, blood pressure target less than 130/80 mmHg, >150 minutes of moderate aerobic activity per week, avoidance of smoking, weight control (via diet and exercise), and continued surveillance/management of/for obstructive sleep apnea.  Greater than 50% of the time during this office visit was spent in education, counseling, and coordination of care related to underlying disease process and testing as outlined.   Medication Adjustments/Labs and Tests Ordered: Current medicines are reviewed at length with the patient today.  Concerns regarding medicines are outlined above.  No orders of the defined types were placed in this encounter.  No orders of the defined types were placed in this encounter.   There are no Patient Instructions on file for this visit.   Signed, Sinclair Grooms, MD  05/20/2019 8:11 AM    [place]

## 2019-05-19 ENCOUNTER — Encounter: Payer: Self-pay | Admitting: Neurology

## 2019-05-19 ENCOUNTER — Ambulatory Visit: Payer: Medicare Other | Admitting: Neurology

## 2019-05-19 ENCOUNTER — Other Ambulatory Visit: Payer: Self-pay

## 2019-05-19 VITALS — BP 145/82 | HR 75 | Temp 97.1°F | Ht 65.0 in | Wt 154.5 lb

## 2019-05-19 DIAGNOSIS — H9193 Unspecified hearing loss, bilateral: Secondary | ICD-10-CM

## 2019-05-19 DIAGNOSIS — G250 Essential tremor: Secondary | ICD-10-CM | POA: Diagnosis not present

## 2019-05-19 DIAGNOSIS — Z951 Presence of aortocoronary bypass graft: Secondary | ICD-10-CM | POA: Diagnosis not present

## 2019-05-19 DIAGNOSIS — H539 Unspecified visual disturbance: Secondary | ICD-10-CM | POA: Diagnosis not present

## 2019-05-19 DIAGNOSIS — R42 Dizziness and giddiness: Secondary | ICD-10-CM

## 2019-05-19 DIAGNOSIS — H9190 Unspecified hearing loss, unspecified ear: Secondary | ICD-10-CM | POA: Insufficient documentation
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
## 2019-05-20 ENCOUNTER — Encounter (INDEPENDENT_AMBULATORY_CARE_PROVIDER_SITE_OTHER): Payer: Self-pay

## 2019-05-20 ENCOUNTER — Ambulatory Visit: Payer: Medicare Other | Admitting: Interventional Cardiology

## 2019-05-20 ENCOUNTER — Other Ambulatory Visit: Payer: Self-pay

## 2019-05-20 ENCOUNTER — Encounter: Payer: Self-pay | Admitting: Interventional Cardiology

## 2019-05-20 VITALS — BP 122/74 | HR 66 | Ht 65.0 in | Wt 152.1 lb

## 2019-05-20 DIAGNOSIS — I25118 Atherosclerotic heart disease of native coronary artery with other forms of angina pectoris: Secondary | ICD-10-CM

## 2019-05-20 DIAGNOSIS — E7849 Other hyperlipidemia: Secondary | ICD-10-CM

## 2019-05-20 DIAGNOSIS — Z951 Presence of aortocoronary bypass graft: Secondary | ICD-10-CM

## 2019-05-20 DIAGNOSIS — R7309 Other abnormal glucose: Secondary | ICD-10-CM

## 2019-05-20 DIAGNOSIS — Z7189 Other specified counseling: Secondary | ICD-10-CM

## 2019-05-20 MED ORDER — ROSUVASTATIN CALCIUM 5 MG PO TABS: 5.0000 mg | ORAL_TABLET | Freq: Every day | ORAL | 3 refills | Status: DC

## 2019-05-20 NOTE — Patient Instructions (Signed)
Medication Instructions:  1) START Rosuvastatin 5mg  once daily  *If you need a refill on your cardiac medications before your next appointment, please call your pharmacy*  Lab Work: Your physician recommends that you return for lab work in: 6-8 weeks.  You will need to be fasting (nothing to eat or drink after midnight except water and black coffee)  If you have labs (blood work) drawn today and your tests are completely normal, you will receive your results only by: Marland Kitchen MyChart Message (if you have MyChart) OR . A paper copy in the mail If you have any lab test that is abnormal or we need to change your treatment, we will call you to review the results.  Testing/Procedures: None  Follow-Up: At [facility], you and your health needs are our priority.  As part of our continuing mission to provide you with exceptional heart care, we have created designated Provider Care Teams.  These Care Teams include your primary Cardiologist (physician) and Advanced Practice Providers (APPs -  Physician Assistants and Nurse Practitioners) who all work together to provide you with the care you need, when you need it.  Your next appointment:   12 month(s)  The format for your next appointment:   In Person  Provider:   You may see Sinclair Grooms, MD or one of the following Advanced Practice Providers on your designated Care Team:    Truitt Merle, NP  Cecilie Kicks, NP  Kathyrn Drown, NP   Other Instructions

## 2019-06-29 ENCOUNTER — Telehealth: Payer: Self-pay | Admitting: Interventional Cardiology

## 2019-06-29 NOTE — Telephone Encounter (Signed)
Spoke with wife and made her aware that we are not allowing other people up with lab appts based on [facility].  Wife verbalized understanding.

## 2019-06-29 NOTE — Telephone Encounter (Signed)
Patient's wife is calling requesting she attend the patient's upcoming lab appointment scheduled for 07/01/19 due to the patient being hard of hearing. Please advise.

## 2019-07-01 ENCOUNTER — Encounter (INDEPENDENT_AMBULATORY_CARE_PROVIDER_SITE_OTHER): Payer: Self-pay

## 2019-07-01 ENCOUNTER — Other Ambulatory Visit: Payer: Self-pay

## 2019-07-01 ENCOUNTER — Other Ambulatory Visit: Payer: Medicare Other | Admitting: *Deleted

## 2019-07-01 DIAGNOSIS — E7849 Other hyperlipidemia: Secondary | ICD-10-CM

## 2019-07-01 LAB — LIPID PANEL
Chol/HDL Ratio: 2.1 ratio (ref 0.0–5.0)
Cholesterol, Total: 130 mg/dL (ref 100–199)
HDL: 63 mg/dL (ref >39)
LDL Chol Calc (NIH): 51 mg/dL (ref 0–99)
Triglycerides: 80 mg/dL (ref 0–149)
VLDL Cholesterol Cal: 16 mg/dL (ref 5–40)

## 2019-07-01 LAB — HEPATIC FUNCTION PANEL
ALT: 14 IU/L (ref 0–44)
AST: 26 IU/L (ref 0–40)
Albumin: 4.5 g/dL (ref 3.7–4.7)
Alkaline Phosphatase: 87 IU/L (ref 39–117)
Bilirubin Total: 0.4 mg/dL (ref 0.0–1.2)
Bilirubin, Direct: 0.11 mg/dL (ref 0.00–0.40)
Total Protein: 6.9 g/dL (ref 6.0–8.5)

## 2020-03-09 ENCOUNTER — Encounter (INDEPENDENT_AMBULATORY_CARE_PROVIDER_SITE_OTHER): Payer: Medicare Other | Admitting: Ophthalmology

## 2020-03-09 ENCOUNTER — Other Ambulatory Visit: Payer: Self-pay

## 2020-03-09 DIAGNOSIS — H4423 Degenerative myopia, bilateral: Secondary | ICD-10-CM

## 2020-03-09 DIAGNOSIS — H43813 Vitreous degeneration, bilateral: Secondary | ICD-10-CM

## 2020-05-09 ENCOUNTER — Encounter (INDEPENDENT_AMBULATORY_CARE_PROVIDER_SITE_OTHER): Payer: Medicare Other | Admitting: Ophthalmology

## 2020-05-09 ENCOUNTER — Other Ambulatory Visit: Payer: Self-pay

## 2020-05-09 DIAGNOSIS — H4423 Degenerative myopia, bilateral: Secondary | ICD-10-CM | POA: Diagnosis not present

## 2020-05-09 DIAGNOSIS — H43813 Vitreous degeneration, bilateral: Secondary | ICD-10-CM | POA: Diagnosis not present

## 2020-05-11 ENCOUNTER — Other Ambulatory Visit: Payer: Self-pay | Admitting: Interventional Cardiology

## 2020-05-28 ENCOUNTER — Encounter (HOSPITAL_COMMUNITY): Payer: Self-pay

## 2020-05-28 ENCOUNTER — Ambulatory Visit (HOSPITAL_COMMUNITY)
Admission: EM | Admit: 2020-05-28 | Discharge: 2020-05-28 | Disposition: A | Discharge status: Home / Self Care | Payer: Medicare Other | Attending: Family Medicine | Admitting: Family Medicine

## 2020-05-28 ENCOUNTER — Other Ambulatory Visit: Payer: Self-pay

## 2020-05-28 DIAGNOSIS — J340 Abscess, furuncle and carbuncle of nose: Secondary | ICD-10-CM | POA: Diagnosis not present

## 2020-05-28 MED ORDER — AMOXICILLIN-POT CLAVULANATE 875-125 MG PO TABS: 1.0000 | ORAL_TABLET | Freq: Two times a day (BID) | ORAL | 0 refills | Status: DC

## 2020-05-28 NOTE — Discharge Instructions (Signed)
Warm compresses for 20 minutes every 2-4 hours Take antibiotic 2 times a day Take pain medicine as needed.  Do not drive on pain medicine Call or return if not improving in the next 2 to 3 days

## 2020-05-28 NOTE — ED Triage Notes (Signed)
Pt in with nostril laceration that occurred over 1 week ago when he was trimming nose hairs. States that he now has abscess that is causing his jaw to hurt and facial redness and swelling.   Noticeable redness to left side of face

## 2020-05-28 NOTE — ED Provider Notes (Addendum)
[place]    CSN: [id number] Arrival date & time: 05/28/20  1005      History   Chief Complaint Chief Complaint  Patient presents with  . Abscess    HPI Keith Cunningham is a 79 y.o. male.   HPI   Here for infection in the left nostril See nurses note, trauma from trimming hair Now swollen red and painful  History of hypertension and heart disease, currently stable and compliant with care  Past Medical History:  Diagnosis Date  . Anemia   . Anginal pain ([place]) 04/21/2012  . Arthritis   . Bleeding internal hemorrhoids 2012  . Coronary artery disease   . H/O hiatal hernia   . History of blood transfusion 2012   "related to bleeding hemorrhoids" (05/06/2012)  . 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 Hyperlipidemia   . Melanoma of face ([place]) 2009   left temple  . Myocardial infarction ([place]) 04/21/2012  . Pneumonia    "as a child" (05/06/2012)  . 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   Marland Kitchen Staph infection 03/2017   had I and D of infected abscess     Patient Active Problem List   Diagnosis Date Noted  . Visual disturbance 05/19/2019  . Progressive hearing loss 05/19/2019  . Benign essential tremor 01/10/2019  . Primary osteoarthritis of right hip 06/25/2018  . Abscess of buttock 03/25/2017  . 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 Blindness of right eye 04/04/2015  . 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 Vertigo 03/23/2014  . Hyperlipidemia 03/23/2014  . 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 Abnormal stress test 05/06/2012  . Coronary atherosclerosis of native coronary artery 05/06/2012    Class: Acute  . Chest pain 04/22/2012  . Iron deficiency anemia 04/22/2012    Past Surgical History:  Procedure Laterality Date  . CARDIAC CATHETERIZATION  04/21/2012   . CATARACT EXTRACTION, BILATERAL Bilateral   . CORONARY ARTERY BYPASS GRAFT  05/10/2012   Procedure: CORONARY ARTERY BYPASS GRAFTING (CABG);  Surgeon: Grace Isaac, MD;  Location: [place];  Service: Open Heart Surgery;  Laterality: N/A;  times three using Left Greater Saphenous Vein Graft harvested endoscopically and Left Internal Mammary Artery  . EXCISIONAL HEMORRHOIDECTOMY  2012   "tried to shrink 1st w/needle; cut them out 2 wk later" (05/06/2012)  . INCISION AND DRAINAGE ABSCESS Right 03/25/2017   Procedure: INCISION AND DRAINAGE ABSCESS;  Surgeon: Jovita Kussmaul, MD;  [facility];  Service: General;  Laterality: Right;  . INTRAOPERATIVE TRANSESOPHAGEAL ECHOCARDIOGRAM  05/10/2012   Procedure: INTRAOPERATIVE TRANSESOPHAGEAL ECHOCARDIOGRAM;  Surgeon: Grace Isaac, MD;  Location: [place];  Service: Open Heart Surgery;  Laterality: N/A;  . MELANOMA EXCISION  2009   "left side of head" (05/06/2012)  . TONSILLECTOMY AND ADENOIDECTOMY  ~ 1950  . TOTAL HIP ARTHROPLASTY Left 05/26/2016   Procedure: TOTAL HIP ARTHROPLASTY ANTERIOR APPROACH;  Surgeon: Dorna Leitz, MD;  Location: [place];  Service: Orthopedics;  Laterality: Left;  . TOTAL HIP ARTHROPLASTY Right 06/25/2018   Procedure: TOTAL HIP ARTHROPLASTY ANTERIOR APPROACH;  Surgeon: Dorna Leitz, MD;  [facility];  Service: Orthopedics;  Laterality: Right;       Home Medications    Prior to Admission medications  Medication Sig Start Date End Date Taking? Authorizing Provider  acetaminophen (TYLENOL) 500 MG tablet Take 500 mg by mouth every 6 (six) hours as needed for moderate pain.    [provider]  amoxicillin-clavulanate (AUGMENTIN) 875-125 MG tablet Take 1 tablet by mouth every 12 (twelve) hours. 05/28/20   Raylene Everts, MD  aspirin (ASPIRIN 81) 81 MG EC tablet Take 1 tablet by mouth daily.     [provider]  Coenzyme Q10 (COQ10) 200 MG CAPS Take 200 mg by mouth daily.    [provider]  metoprolol succinate (TOPROL XL) 25 MG 24 hr tablet Take 1 tablet (25 mg total) by mouth daily. 01/10/19   Sater, Nanine Means, MD  Omega-3 Fatty Acids (FISH OIL) 500 MG CAPS Take 500 mg by mouth daily.     [provider]  rosuvastatin (CRESTOR) 5 MG tablet Take 1 tablet by mouth once daily 05/11/20   Belva Crome, MD    Family History Family History  Problem Relation Age of Onset  . Diabetes Mellitus II Mother   . Heart disease Mother   . Heart attack Father   . Diabetes Sister     Social History Social History   Tobacco Use  . Smoking status: Never Smoker  . Smokeless tobacco: Never Used  Vaping Use  . Vaping Use: Never used  Substance Use Topics  . Alcohol use: No  . Drug use: No     Allergies   Atorvastatin, Statins, and Codeine   Review of Systems Review of Systems See HPI  Physical Exam Triage Vital Signs ED Triage Vitals  Enc Vitals Group     BP 05/28/20 1211 (!) 169/85     Pulse Rate 05/28/20 1208 81     Resp 05/28/20 1208 20     Temp 05/28/20 1208 98.3 F (36.8 C)     Temp Source 05/28/20 1208 Oral     SpO2 05/28/20 1208 100 %     Weight --      Height --      Head Circumference --      Peak Flow --      Pain Score 05/28/20 1206 8     Pain Loc --      Pain Edu? --      Excl. in [place]? --    No data found.  Updated Vital Signs BP (!) 160/79 (BP Location: Right Arm)   Pulse 81   Temp 98.3 F (36.8 C) (Oral)   Resp 20   SpO2 100%      Physical Exam Constitutional:      General: He is not in acute distress.    Appearance: Normal appearance. He is well-developed and well-nourished.  HENT:     Head: Normocephalic and atraumatic.     Nose:     Comments: Left nostril has redness and soft tissue swelling, very tender, small abscess visualized up in nares    Mouth/Throat:     Mouth: Oropharynx is clear and moist.  Eyes:     Conjunctiva/sclera: Conjunctivae normal.     Pupils: Pupils are equal, round, and reactive to light.   Cardiovascular:     Rate and Rhythm: Normal rate.  Pulmonary:     Effort: Pulmonary effort is normal. No respiratory distress.  Abdominal:     General: There is no distension.     Palpations: Abdomen is soft.  Musculoskeletal:        General: No edema. Normal range  of motion.     Cervical back: Normal range of motion.  Skin:    General: Skin is warm and dry.  Neurological:     Mental Status: He is alert.  Psychiatric:        Behavior: Behavior normal.      UC Treatments / Results  Labs (all labs ordered are listed, but only abnormal results are displayed) Labs Reviewed - No data to display  EKG   Radiology No results found.  Procedures Procedures (including critical care time)  Medications Ordered in UC Medications - No data to display  Initial Impression / Assessment and Plan / UC Course  I have reviewed the triage vital signs and the nursing notes.  Pertinent labs & imaging results that were available during my care of the patient were reviewed by me and considered in my medical decision making (see chart for details).     Reviewed treatment Return if fails to improve Final Clinical Impressions(s) / UC Diagnoses   Final diagnoses:  Cellulitis of nose     Discharge Instructions     Warm compresses for 20 minutes every 2-4 hours Take antibiotic 2 times a day Take pain medicine as needed.  Do not drive on pain medicine Call or return if not improving in the next 2 to 3 days   ED Prescriptions    Medication Sig Dispense Auth. Provider   amoxicillin-clavulanate (AUGMENTIN) 875-125 MG tablet Take 1 tablet by mouth every 12 (twelve) hours. 14 tablet Raylene Everts, MD     PDMP not reviewed this encounter.   Raylene Everts, MD 05/28/20 1329    Raylene Everts, MD 05/28/20 1330

## 2020-06-18 NOTE — Progress Notes (Signed)
Cardiology Office Note:    Date:  06/22/2020   ID:  Keith Cunningham, DOB Keith 24, 1942, MRN [id number]  PCP:  Asencion Gowda.Keith Saucer, MD  Cardiologist:  Lesleigh Noe, MD   Referring MD: Asencion Gowda.Keith Saucer, MD   Chief Complaint  Patient presents with  . Coronary Artery Disease  . Hyperlipidemia    History of Present Illness:    Keith Cunningham is a 80 y.o. male with a hx of coronary artery disease with prior sequential SVG to OM1 and OM 2, LIMA to LAD, and SVG to PDAin 2013, hyperlipidemia, and statin intolerance.  Limited only by bilateral hip pain.  Discomfort is most severe when he goes from sitting to standing position.  After he starts walking the discomfort improves.  No exertion related chest pain, dyspnea, orthopnea, PND, or edema.  No episodes of syncope.  No palpitations or irregularity in heart rhythm to suggest atrial fib.  Denies neurological/transient neurological events.  Past Medical History:  Diagnosis Date  . Anemia   . Anginal pain (HCC) 04/21/2012  . Arthritis   . Bleeding internal hemorrhoids 2012  . Coronary artery disease   . H/O hiatal hernia   . History of blood transfusion 2012   "related to bleeding hemorrhoids" (05/06/2012)  . 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 Hyperlipidemia   . 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 Myocardial infarction (HCC) 04/21/2012  . Pneumonia    "as a child" (05/06/2012)  . 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   Marland Kitchen Staph infection 03/2017   had I and D of infected abscess     Past Surgical History:  Procedure Laterality Date  . CARDIAC CATHETERIZATION  04/21/2012  . CATARACT EXTRACTION, BILATERAL Bilateral   . CORONARY ARTERY BYPASS GRAFT  05/10/2012   Procedure: CORONARY ARTERY BYPASS GRAFTING (CABG);  Surgeon: Delight Ovens, MD;  Location: [facility] OR;  Service: Open  Heart Surgery;  Laterality: N/A;  times three using Left Greater Saphenous Vein Graft harvested endoscopically and Left Internal Mammary Artery  . EXCISIONAL HEMORRHOIDECTOMY  2012   "tried to shrink 1st w/needle; cut them out 2 wk later" (05/06/2012)  . INCISION AND DRAINAGE ABSCESS Right 03/25/2017   Procedure: INCISION AND DRAINAGE ABSCESS;  Surgeon: Griselda Miner, MD;  Location: [facility] ORS;  Service: General;  Laterality: Right;  . INTRAOPERATIVE TRANSESOPHAGEAL ECHOCARDIOGRAM  05/10/2012   Procedure: INTRAOPERATIVE TRANSESOPHAGEAL ECHOCARDIOGRAM;  Surgeon: Delight Ovens, MD;  Location: [facility] OR;  Service: Open Heart Surgery;  Laterality: N/A;  . MELANOMA EXCISION  2009   "left side of head" (05/06/2012)  . TONSILLECTOMY AND ADENOIDECTOMY  ~ 1950  . TOTAL HIP ARTHROPLASTY Left 05/26/2016   Procedure: TOTAL HIP ARTHROPLASTY ANTERIOR APPROACH;  Surgeon: Jodi Geralds, MD;  Location: [facility] OR;  Service: Orthopedics;  Laterality: Left;  . TOTAL HIP ARTHROPLASTY Right 06/25/2018   Procedure: TOTAL HIP ARTHROPLASTY ANTERIOR APPROACH;  Surgeon: Jodi Geralds, MD;  Location: [facility] ORS;  Service: Orthopedics;  Laterality: Right;    Current Medications: Current Meds  Medication Sig  . acetaminophen (TYLENOL) 500 MG tablet Take 500 mg by mouth every 6 (six) hours as needed for moderate pain.  Marland Kitchen aspirin 81 MG EC tablet Take 1 tablet by mouth daily.   . Coenzyme Q10 (COQ10) 200 MG  CAPS Take 200 mg by mouth daily.  . Omega-3 Fatty Acids (FISH OIL) 500 MG CAPS Take 500 mg by mouth daily.   . rosuvastatin (CRESTOR) 5 MG tablet Take 1 tablet by mouth once daily     Allergies:   Atorvastatin, Statins, and Codeine   Social History   Socioeconomic History  . Marital status: Married    Spouse name: Not on file  . Number of children: Not on file  . Years of education: Not on file  . Highest education level: Not on file  Occupational History  . Occupation: retired    Comment: Airline pilotsales  Tobacco Use  . Smoking  status: Never Smoker  . Smokeless tobacco: Never Used  Vaping Use  . Vaping Use: Never used  Substance and Sexual Activity  . Alcohol use: No  . Drug use: No  . Sexual activity: Yes  Other Topics Concern  . Not on file  Social History Narrative   Lives with wife   Caffeine use: Tea: 2 glasses per day (8 oz_   Coffee: every morning    Right handed    Social Determinants of Health   Financial Resource Strain: Not on file  Food Insecurity: Not on file  Transportation Needs: Not on file  Physical Activity: Not on file  Stress: Not on file  Social Connections: Not on file     Family History: The patient's family history includes Diabetes in his sister; Diabetes Mellitus II in his mother; Heart attack in his father; Heart disease in his mother.  ROS:   Please see the history of present illness.    Bilateral arthritic hips limit physical activity.  Does not get 6 hours of sleep.  Wife says he does not snore.  No excessive daytime sleepiness.  All other systems reviewed and are negative.  EKGs/Labs/Other Studies Reviewed:    The following studies were reviewed today: No new imaging data  EKG:  EKG normal sinus rhythm, incomplete right bundle, left anterior hemiblock..  To December 2020, the incomplete right bundle is new.  Recent Labs: 07/01/2019: ALT 14  Recent Lipid Panel    Component Value Date/Time   CHOL 130 07/01/2019 0724   TRIG 80 07/01/2019 0724   HDL 63 07/01/2019 0724   CHOLHDL 2.1 07/01/2019 0724   CHOLHDL 3.7 04/04/2015 0908   VLDL 27 04/04/2015 0908   LDLCALC 51 07/01/2019 0724    Physical Exam:    VS:  BP 132/78   Pulse 76   Ht 5\' 6"  (1.676 m)   Wt 158 lb 6.4 oz (71.8 kg)   SpO2 98%   BMI 25.57 kg/m     Wt Readings from Last 3 Encounters:  06/22/20 158 lb 6.4 oz (71.8 kg)  05/20/19 152 lb 1.9 oz (69 kg)  05/19/19 154 lb 8 oz (70.1 kg)     GEN: Appears younger than stated age. No acute distress HEENT: Normal NECK: No JVD. LYMPHATICS: No  lymphadenopathy CARDIAC: No murmur. RRR S4 but no gallop, or edema. VASCULAR:  Normal Pulses. No bruits. RESPIRATORY:  Clear to auscultation without rales, wheezing or rhonchi  ABDOMEN: Soft, non-tender, non-distended, No pulsatile mass, MUSCULOSKELETAL: No deformity  SKIN: Warm and dry NEUROLOGIC:  Alert and oriented x 3 PSYCHIATRIC:  Normal affect   ASSESSMENT:    1. Atherosclerosis of native coronary artery of native heart with stable angina pectoris (HCC)   2. Other hyperlipidemia   3. Elevated hemoglobin A1c   4. Educated about COVID-19 virus infection  5. S/P CABG (coronary artery bypass graft)   6. Benign essential tremor    PLAN:    In order of problems listed above:  He is asymptomatic.Overall education and awareness concerning secondary risk prevention was discussed in detail: LDL less than 70, hemoglobin A1c less than 7, blood pressure target less than 130/80 mmHg, >150 minutes of moderate aerobic activity per week, avoidance of smoking, weight control (via diet and exercise), and continued surveillance/management of/for obstructive sleep apnea. Continue low-dose rosuvastatin 5 mg/day.  Most recent LDL was 78.  No statin associated side effects.  Most recent LDL was 51. Hemoglobin A1c done most recently is not available.  Followed by Dr. Donnie Coffin at [facility]. Vaccinated, boosted, and practicing medication. No angina or symptoms to suggest graft occlusion Did not discuss     Medication Adjustments/Labs and Tests Ordered: Current medicines are reviewed at length with the patient today.  Concerns regarding medicines are outlined above.  Orders Placed This Encounter  Procedures  . EKG 12-Lead   No orders of the defined types were placed in this encounter.   Patient Instructions  Medication Instructions:  Your physician recommends that you continue on your current medications as directed. Please refer to the Current Medication list given to you today.  *If you  need a refill on your cardiac medications before your next appointment, please call your pharmacy*   Lab Work: None If you have labs (blood work) drawn today and your tests are completely normal, you will receive your results only by: Marland Kitchen MyChart Message (if you have MyChart) OR . A paper copy in the mail If you have any lab test that is abnormal or we need to change your treatment, we will call you to review the results.   Testing/Procedures: None   Follow-Up: At [facility], you and your health needs are our priority.  As part of our continuing mission to provide you with exceptional heart care, we have created designated Provider Care Teams.  These Care Teams include your primary Cardiologist (physician) and Advanced Practice Providers (APPs -  Physician Assistants and Nurse Practitioners) who all work together to provide you with the care you need, when you need it.  We recommend signing up for the patient portal called "MyChart".  Sign up information is provided on this After Visit Summary.  MyChart is used to connect with patients for Virtual Visits (Telemedicine).  Patients are able to view lab/test results, encounter notes, upcoming appointments, etc.  Non-urgent messages can be sent to your provider as well.   To learn more about what you can do with MyChart, go to [email or web address].    Your next appointment:   1 year(s)  The format for your next appointment:   In Person  Provider:   You may see Sinclair Grooms, MD or one of the following Advanced Practice Providers on your designated Care Team:    Truitt Merle, NP  Cecilie Kicks, NP  Kathyrn Drown, NP    Other Instructions      Signed, Sinclair Grooms, MD  06/22/2020 10:28 AM    [place]

## 2020-06-22 ENCOUNTER — Ambulatory Visit: Payer: Medicare Other | Admitting: Interventional Cardiology

## 2020-06-22 ENCOUNTER — Encounter: Payer: Self-pay | Admitting: Interventional Cardiology

## 2020-06-22 ENCOUNTER — Other Ambulatory Visit: Payer: Self-pay

## 2020-06-22 VITALS — BP 132/78 | HR 76 | Ht 66.0 in | Wt 158.4 lb

## 2020-06-22 DIAGNOSIS — E7849 Other hyperlipidemia: Secondary | ICD-10-CM

## 2020-06-22 DIAGNOSIS — G250 Essential tremor: Secondary | ICD-10-CM | POA: Diagnosis not present

## 2020-06-22 DIAGNOSIS — Z7189 Other specified counseling: Secondary | ICD-10-CM

## 2020-06-22 DIAGNOSIS — R7309 Other abnormal glucose: Secondary | ICD-10-CM

## 2020-06-22 DIAGNOSIS — Z951 Presence of aortocoronary bypass graft: Secondary | ICD-10-CM | POA: Diagnosis not present

## 2020-06-22 DIAGNOSIS — I25118 Atherosclerotic heart disease of native coronary artery with other forms of angina pectoris: Secondary | ICD-10-CM

## 2020-06-22 NOTE — Patient Instructions (Signed)
Medication Instructions:  Your physician recommends that you continue on your current medications as directed. Please refer to the Current Medication list given to you today.  *If you need a refill on your cardiac medications before your next appointment, please call your pharmacy*   Lab Work: None If you have labs (blood work) drawn today and your tests are completely normal, you will receive your results only by: . MyChart Message (if you have MyChart) OR . A paper copy in the mail If you have any lab test that is abnormal or we need to change your treatment, we will call you to review the results.   Testing/Procedures: None   Follow-Up: At [facility], you and your health needs are our priority.  As part of our continuing mission to provide you with exceptional heart care, we have created designated Provider Care Teams.  These Care Teams include your primary Cardiologist (physician) and Advanced Practice Providers (APPs -  Physician Assistants and Nurse Practitioners) who all work together to provide you with the care you need, when you need it.  We recommend signing up for the patient portal called "MyChart".  Sign up information is provided on this After Visit Summary.  MyChart is used to connect with patients for Virtual Visits (Telemedicine).  Patients are able to view lab/test results, encounter notes, upcoming appointments, etc.  Non-urgent messages can be sent to your provider as well.   To learn more about what you can do with MyChart, go to [email or web address].    Your next appointment:   1 year(s)  The format for your next appointment:   In Person  Provider:   You may see Henry W Smith III, MD or one of the following Advanced Practice Providers on your designated Care Team:    Lori Gerhardt, NP  Laura Ingold, NP  Jill McDaniel, NP    Other Instructions   

## 2020-07-23 ENCOUNTER — Ambulatory Visit (INDEPENDENT_AMBULATORY_CARE_PROVIDER_SITE_OTHER): Payer: Medicare Other | Admitting: Family Medicine

## 2020-07-23 ENCOUNTER — Encounter: Payer: Self-pay | Admitting: Family Medicine

## 2020-07-23 ENCOUNTER — Other Ambulatory Visit: Payer: Self-pay

## 2020-07-23 VITALS — BP 130/70 | Temp 89.0°F | Ht 66.0 in | Wt 157.0 lb

## 2020-07-23 DIAGNOSIS — Z131 Encounter for screening for diabetes mellitus: Secondary | ICD-10-CM

## 2020-07-23 DIAGNOSIS — Z1159 Encounter for screening for other viral diseases: Secondary | ICD-10-CM

## 2020-07-23 DIAGNOSIS — Z7689 Persons encountering health services in other specified circumstances: Secondary | ICD-10-CM

## 2020-07-23 NOTE — Progress Notes (Signed)
    SUBJECTIVE:   CHIEF COMPLAINT / HPI: Establish care  No concerns today. Previously with [facility] but decided to transfer care because they could not accommodate walk-in/same day appointments.  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 Was a race car driver for 10 years. Highest level of education middle school. Lives with wife Karrie Meres. Feels safe in relationship. Patient works out with weights 3 times/week. Denies smoking history, recreational drug use, and alcohol use. Likes playing golf for fun.   PERTINENT  PMH / PSH: CAD s/p CABG (followed by Dr. Tamala Julian), essential tremor (followed by Dr. Felecia Shelling), progressive hearing loss, OA (s/p bilateral hip replacements), blindness of right eye  OBJECTIVE:   BP 130/70   Temp (!) 89 F (31.7 C)   Ht 5\' 6"  (1.676 m)   Wt 157 lb (71.2 kg)   SpO2 95%   BMI 25.34 kg/m   General: Fit-appearing elderly male, NAD CV: RRR, no murmurs Pulm: CTAB, no wheezes or rales Abd: soft, non-tender, +BS Neuro: 5/5 strength all extremities  ASSESSMENT/PLAN:    HCM - hepatitis C screening today - HbA1c, has not had one recently - Covid vaccine UTD with booster - Tdap last done about 2 years ago, will obtain records - PCV13 declined - influenza vaccine declined  HLD Well-controlled on rosuvastatin 5 mg, LDL goal under 70.  Most recent LDL 51 done in January, 2021.  Zola Button, MD [place]

## 2020-07-23 NOTE — Patient Instructions (Signed)
It was nice seeing you today!  Keep up the good work staying healthy. Please think about getting the flu shot and the pneumonia vaccine.  I will update you with the results of your blood work.  Return in 1 year for your next physical or call for an appointment sooner if needed.  Stay well, Zola Button, MD [place] [phone]    Health Maintenance After Age 79 After age 38, you are at a higher risk for certain long-term diseases and infections as well as injuries from falls. Falls are a major cause of broken bones and head injuries in people who are older than age 38. Getting regular preventive care can help to keep you healthy and well. Preventive care includes getting regular testing and making lifestyle changes as recommended by your health care provider. Talk with your health care provider about:  Which screenings and tests you should have. A screening is a test that checks for a disease when you have no symptoms.  A diet and exercise plan that is right for you. What should I know about screenings and tests to prevent falls? Screening and testing are the best ways to find a health problem early. Early diagnosis and treatment give you the best chance of managing medical conditions that are common after age 70. Certain conditions and lifestyle choices may make you more likely to have a fall. Your health care provider may recommend:  Regular vision checks. Poor vision and conditions such as cataracts can make you more likely to have a fall. If you wear glasses, make sure to get your prescription updated if your vision changes.  Medicine review. Work with your health care provider to regularly review all of the medicines you are taking, including over-the-counter medicines. Ask your health care provider about any side effects that may make you more likely to have a fall. Tell your health care provider if any medicines that you take make you feel dizzy or  sleepy.  Osteoporosis screening. Osteoporosis is a condition that causes the bones to get weaker. This can make the bones weak and cause them to break more easily.  Blood pressure screening. Blood pressure changes and medicines to control blood pressure can make you feel dizzy.  Strength and balance checks. Your health care provider may recommend certain tests to check your strength and balance while standing, walking, or changing positions.  Foot health exam. Foot pain and numbness, as well as not wearing proper footwear, can make you more likely to have a fall.  Depression screening. You may be more likely to have a fall if you have a fear of falling, feel emotionally low, or feel unable to do activities that you used to do.  Alcohol use screening. Using too much alcohol can affect your balance and may make you more likely to have a fall. What actions can I take to lower my risk of falls? General instructions  Talk with your health care provider about your risks for falling. Tell your health care provider if: ? You fall. Be sure to tell your health care provider about all falls, even ones that seem minor. ? You feel dizzy, sleepy, or off-balance.  Take over-the-counter and prescription medicines only as told by your health care provider. These include any supplements.  Eat a healthy diet and maintain a healthy weight. A healthy diet includes low-fat dairy products, low-fat (lean) meats, and fiber from whole grains, beans, and lots of fruits and vegetables. Home safety  Remove any tripping hazards, such as rugs, cords, and clutter.  Install safety equipment such as grab bars in bathrooms and safety rails on stairs.  Keep rooms and walkways well-lit. Activity  Follow a regular exercise program to stay fit. This will help you maintain your balance. Ask your health care provider what types of exercise are appropriate for you.  If you need a cane or walker, use it as recommended by  your health care provider.  Wear supportive shoes that have nonskid soles.   Lifestyle  Do not drink alcohol if your health care provider tells you not to drink.  If you drink alcohol, limit how much you have: ? 0-1 drink a day for women. ? 0-2 drinks a day for men.  Be aware of how much alcohol is in your drink. In the [place] one drink equals one typical bottle of beer (12 oz), one-half glass of wine (5 oz), or one shot of hard liquor (1 oz).  Do not use any products that contain nicotine or tobacco, such as cigarettes and e-cigarettes. If you need help quitting, ask your health care provider. Summary  Having a healthy lifestyle and getting preventive care can help to protect your health and wellness after age 47.  Screening and testing are the best way to find a health problem early and help you avoid having a fall. Early diagnosis and treatment give you the best chance for managing medical conditions that are more common for people who are older than age 18.  Falls are a major cause of broken bones and head injuries in people who are older than age 72. Take precautions to prevent a fall at home.  Work with your health care provider to learn what changes you can make to improve your health and wellness and to prevent falls. This information is not intended to replace advice given to you by your health care provider. Make sure you discuss any questions you have with your health care provider. Document Revised: 09/23/2018 Document Reviewed: 04/15/2017 Elsevier Patient Education  2021 [employer].

## 2020-07-23 NOTE — Progress Notes (Signed)
ROI completed to [facility] primary. Original placed in to be scanned pile and copy placed in to be faxed pile. Christen Bame, CMA

## 2020-07-24 ENCOUNTER — Encounter: Payer: Self-pay | Admitting: Family Medicine

## 2020-07-24 LAB — HCV INTERPRETATION

## 2020-07-24 LAB — HEMOGLOBIN A1C
Est. average glucose Bld gHb Est-mCnc: 114 mg/dL
Hgb A1c MFr Bld: 5.6 % (ref 4.8–5.6)

## 2020-07-24 LAB — HCV AB W REFLEX TO QUANT PCR: HCV Ab: <0.1 s/co ratio (ref 0.0–0.9)

## 2020-08-08 ENCOUNTER — Other Ambulatory Visit: Payer: Self-pay | Admitting: Interventional Cardiology

## 2020-09-01 NOTE — Progress Notes (Signed)
    SUBJECTIVE:   CHIEF COMPLAINT / HPI:   Boil under both arm pits: Pleasant patient presents to clinic with his wife with concerns for "boils" in both of his axilla.  Patient reports that one of the boils started up a week and a half ago on Friday.  Patient has been applying boil ease to the lesion, reports that it has shrunk in size and is now almost gone.  It was tender and red.  This previous Friday (4 days ago) and other site started on the patient's left axillary region.  Patient denies fevers, body aches, chills.  He is well-appearing.  He endorses mild tenderness to the left axillary lesion.  PERTINENT  PMH / PSH:  Patient Active Problem List   Diagnosis Date Noted  . Cutaneous abscesses of both axillae 09/03/2020  . Visual disturbance 05/19/2019  . Progressive hearing loss 05/19/2019  . Benign essential tremor 01/10/2019  . Primary osteoarthritis of right hip 06/25/2018  . Abscess of buttock 03/25/2017  . 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 Blindness of right eye 04/04/2015  . 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 Vertigo 03/23/2014  . Hyperlipidemia 03/23/2014  . 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 Abnormal stress test 05/06/2012  . Coronary atherosclerosis of native coronary artery 05/06/2012    Class: Acute  . Chest pain 04/22/2012  . Iron deficiency anemia 04/22/2012     OBJECTIVE:   BP (!) 148/86   Pulse 77   Ht 5\' 6"  (1.676 m)   Wt 158 lb 3.2 oz (71.8 kg)   SpO2 97%   BMI 25.53 kg/m    Physical exam: General: Well-appearing patient, no acute distress, nontoxic-appearing Respiratory: CTA bilaterally, comfortable work of breathing on room air Integumentary: 3 x 3 mm firm mobile erythematous papule appreciated to patient's right axilla; 7 x 7 mm firm mobile nonfluctuant erythematous nodule appreciated to patient's left axilla.   ASSESSMENT/PLAN:   Cutaneous abscesses of both axillae Patient with  bilateral superficial cutaneous abscesses of his axillae.  Right axillary lesion is improving with home remedies and heating pad.  No concern today for systemic symptoms such as fever, body aches, chills.  No concern at this time for arthropod bite/infection.  Patient has a history of abscesses. -Patient plans to continue to apply boil ease and heating pad to both sites. -If lesions are not better he can call the office for an antibiotic     Daisy Floro, [place]

## 2020-09-03 ENCOUNTER — Ambulatory Visit (INDEPENDENT_AMBULATORY_CARE_PROVIDER_SITE_OTHER): Payer: Medicare Other | Admitting: Family Medicine

## 2020-09-03 ENCOUNTER — Other Ambulatory Visit: Payer: Self-pay

## 2020-09-03 VITALS — BP 148/86 | HR 77 | Ht 66.0 in | Wt 158.2 lb

## 2020-09-03 DIAGNOSIS — L02411 Cutaneous abscess of right axilla: Secondary | ICD-10-CM | POA: Insufficient documentation

## 2020-09-03 DIAGNOSIS — L02412 Cutaneous abscess of left axilla: Secondary | ICD-10-CM | POA: Diagnosis not present

## 2020-09-03 NOTE — Assessment & Plan Note (Signed)
Patient with bilateral superficial cutaneous abscesses of his axillae.  Right axillary lesion is improving with home remedies and heating pad.  No concern today for systemic symptoms such as fever, body aches, chills.  No concern at this time for arthropod bite/infection.  Patient has a history of abscesses. -Patient plans to continue to apply boil ease and heating pad to both sites. -If lesions are not better he can call the office for an antibiotic

## 2020-09-06 ENCOUNTER — Telehealth: Payer: Self-pay | Admitting: *Deleted

## 2020-09-06 NOTE — Telephone Encounter (Signed)
Wife calls because the measures they have been taking for the boil is not helping.  He know has 2 - 3 smaller ones forming.   She is calling back to get the antibiotics that they discussed at his last appt.   To Dr. Ouida Sills.  Christen Bame, CMA

## 2020-09-07 NOTE — Telephone Encounter (Signed)
Pt walked in with wife requesting prescription for medication be called in. Follow-up from yesterday and this morning.  Message was sent to nurse who replied.

## 2020-09-07 NOTE — Telephone Encounter (Signed)
Patent's wife returns call to nurse line regarding antibiotic request from yesterday.   Please advise.   Talbot Grumbling, RN

## 2020-09-10 ENCOUNTER — Other Ambulatory Visit: Payer: Self-pay

## 2020-09-10 ENCOUNTER — Ambulatory Visit (INDEPENDENT_AMBULATORY_CARE_PROVIDER_SITE_OTHER): Payer: Medicare Other | Admitting: Family Medicine

## 2020-09-10 ENCOUNTER — Encounter: Payer: Self-pay | Admitting: Family Medicine

## 2020-09-10 VITALS — BP 156/82 | HR 98 | Ht 66.0 in | Wt 158.2 lb

## 2020-09-10 DIAGNOSIS — L02429 Furuncle of limb, unspecified: Secondary | ICD-10-CM

## 2020-09-10 DIAGNOSIS — Z23 Encounter for immunization: Secondary | ICD-10-CM | POA: Diagnosis not present

## 2020-09-10 MED ORDER — DOXYCYCLINE HYCLATE 100 MG PO TABS: 100.0000 mg | ORAL_TABLET | Freq: Two times a day (BID) | ORAL | 0 refills | Status: DC

## 2020-09-10 NOTE — Patient Instructions (Signed)
Watch for bleeding or infection on left arm. Warm compresses on right arm. Tetanus shots  Are good for at least 10 years.   Antibiotics are sent in. I hope this takes care of the problem. If next Monday, you still have problems, let us check you again.

## 2020-09-11 ENCOUNTER — Encounter: Payer: Self-pay | Admitting: Family Medicine

## 2020-09-11 ENCOUNTER — Telehealth: Payer: Self-pay

## 2020-09-11 DIAGNOSIS — L02429 Furuncle of limb, unspecified: Secondary | ICD-10-CM

## 2020-09-11 MED ORDER — CEPHALEXIN 500 MG PO CAPS: 500.0000 mg | ORAL_CAPSULE | Freq: Four times a day (QID) | ORAL | 0 refills | Status: DC

## 2020-09-11 NOTE — Telephone Encounter (Signed)
Patient's wife calls nurse line regarding allergic reaction to antibiotic, doxycycline. Wife reports that patient has broken out in rash all over body since taking first dose of antibiotic. Patient is not having any SHOB, fast heart rate, swelling/tightness of airway or any other symptoms.   Wife is requesting alternative antibiotic to be sent to [facility].   Provided with strict ED precautions.   Talbot Grumbling, RN

## 2020-09-11 NOTE — Progress Notes (Signed)
    SUBJECTIVE:   CHIEF COMPLAINT / HPI:   Worsening left axillary boil.  Seen one week ago with a small axillary boil and treated with topicals and compresses.  Worsened and because of communication problems with [place], we did not get him his requested antibiotics late last week.    Comes in today with worsening boil of left axilla and multiple small red spots (beginning boils?) in right axilla.  No previous problems with infections/boils.  Not immunocompromised.  No fever or constitutional sx.    OBJECTIVE:   BP (!) 156/82   Pulse 98   Ht 5\' 6"  (1.676 m)   Wt 158 lb 3.2 oz (71.8 kg)   SpO2 100%   BMI 25.53 kg/m   Rt axilla.  5 pea sized red spots without fluctuance Left axilla.  Single 1inch by 2 inch pointing but not yet draining fluctuant mass - superficial.  Discussed options, He choose I&D today  Informed consent obtained Time out taken 1% xylocaine with epi. Simple I&D done resulting in drainage of 3-4 cc of frank pus. Bleeding minimal.  Would left open.  No packing.   Patient tolerated procedure well and left clinic in good condition.  ASSESSMENT/PLAN:   No problem-specific Assessment & Plan notes found for this encounter.     Zenia Resides, MD [place]

## 2020-09-11 NOTE — Telephone Encounter (Signed)
Called.  Yes, rash after doxy.  Labeled chart as allergic.  Confirmed with wife, no more doxy.  Sent Rx for keflex to pharmacy.

## 2020-09-11 NOTE — Assessment & Plan Note (Signed)
Successful I&D of left axillary boil.   Right axillary folliculitis not fluctuant or drainable. Given instructions for local care. Doxy sent to pharmacy. Call follow up day.  Feeling fine but had systemic rash after single dose of doxy.  Doxy added to allergy list.  Prescribed Keflex.

## 2020-11-05 ENCOUNTER — Encounter (INDEPENDENT_AMBULATORY_CARE_PROVIDER_SITE_OTHER): Payer: Medicare Other | Admitting: Ophthalmology

## 2020-11-05 ENCOUNTER — Other Ambulatory Visit: Payer: Self-pay

## 2020-11-05 DIAGNOSIS — H43813 Vitreous degeneration, bilateral: Secondary | ICD-10-CM | POA: Diagnosis not present

## 2020-11-05 DIAGNOSIS — H4423 Degenerative myopia, bilateral: Secondary | ICD-10-CM | POA: Diagnosis not present

## 2020-11-05 DIAGNOSIS — H47312 Coloboma of optic disc, left eye: Secondary | ICD-10-CM | POA: Diagnosis not present

## 2020-11-28 DIAGNOSIS — D1801 Hemangioma of skin and subcutaneous tissue: Secondary | ICD-10-CM | POA: Diagnosis not present

## 2020-11-28 DIAGNOSIS — L812 Freckles: Secondary | ICD-10-CM | POA: Diagnosis not present

## 2020-11-28 DIAGNOSIS — L821 Other seborrheic keratosis: Secondary | ICD-10-CM | POA: Diagnosis not present

## 2020-11-28 DIAGNOSIS — L570 Actinic keratosis: Secondary | ICD-10-CM | POA: Diagnosis not present

## 2020-11-28 DIAGNOSIS — Z85820 Personal history of malignant melanoma of skin: Secondary | ICD-10-CM | POA: Diagnosis not present

## 2020-12-26 DIAGNOSIS — H52202 Unspecified astigmatism, left eye: Secondary | ICD-10-CM | POA: Diagnosis not present

## 2020-12-26 DIAGNOSIS — H5202 Hypermetropia, left eye: Secondary | ICD-10-CM | POA: Diagnosis not present

## 2020-12-26 DIAGNOSIS — H31003 Unspecified chorioretinal scars, bilateral: Secondary | ICD-10-CM | POA: Diagnosis not present

## 2021-03-25 DIAGNOSIS — L821 Other seborrheic keratosis: Secondary | ICD-10-CM | POA: Diagnosis not present

## 2021-03-25 DIAGNOSIS — L820 Inflamed seborrheic keratosis: Secondary | ICD-10-CM | POA: Diagnosis not present

## 2021-03-25 DIAGNOSIS — Z85820 Personal history of malignant melanoma of skin: Secondary | ICD-10-CM | POA: Diagnosis not present

## 2021-05-02 ENCOUNTER — Other Ambulatory Visit: Payer: Self-pay

## 2021-05-02 ENCOUNTER — Encounter: Payer: Self-pay | Admitting: Family Medicine

## 2021-05-02 ENCOUNTER — Ambulatory Visit (INDEPENDENT_AMBULATORY_CARE_PROVIDER_SITE_OTHER): Payer: Medicare Other | Admitting: Family Medicine

## 2021-05-02 ENCOUNTER — Inpatient Hospital Stay: Admission: RE | Admit: 2021-05-02 | Payer: Medicare Other | Source: Ambulatory Visit

## 2021-05-02 ENCOUNTER — Ambulatory Visit
Admission: RE | Admit: 2021-05-02 | Discharge: 2021-05-02 | Disposition: A | Discharge status: Home / Self Care | Payer: Medicare Other | Source: Ambulatory Visit | Attending: Family Medicine | Admitting: Family Medicine

## 2021-05-02 VITALS — BP 150/78 | HR 91 | Ht 66.0 in | Wt 158.6 lb

## 2021-05-02 DIAGNOSIS — K8020 Calculus of gallbladder without cholecystitis without obstruction: Secondary | ICD-10-CM | POA: Diagnosis not present

## 2021-05-02 DIAGNOSIS — R1011 Right upper quadrant pain: Secondary | ICD-10-CM | POA: Diagnosis not present

## 2021-05-02 DIAGNOSIS — K760 Fatty (change of) liver, not elsewhere classified: Secondary | ICD-10-CM | POA: Diagnosis not present

## 2021-05-02 DIAGNOSIS — K449 Diaphragmatic hernia without obstruction or gangrene: Secondary | ICD-10-CM | POA: Diagnosis not present

## 2021-05-02 DIAGNOSIS — K4020 Bilateral inguinal hernia, without obstruction or gangrene, not specified as recurrent: Secondary | ICD-10-CM | POA: Diagnosis not present

## 2021-05-02 LAB — SPECIAL HANDLING

## 2021-05-02 LAB — HEMOCCULT GUIAC POC 1CARD (OFFICE): Fecal Occult Blood, POC: NEGATIVE

## 2021-05-02 MED ORDER — IOPAMIDOL (ISOVUE-300) INJECTION 61%
100.0000 mL | Freq: Once | INTRAVENOUS | Status: AC | PRN
  Administered 2021-05-02: 12:00:00 100 mL via INTRAVENOUS

## 2021-05-02 NOTE — Patient Instructions (Signed)
I am concerned about this acute abdomen pain, especially since there is some tenderness around the gallbladder.    We are sending you for a CT Abdomen to look for causes of your acute pain, including gallbladder disease.   I will be in contact with you once I get the CT results.

## 2021-05-02 NOTE — Progress Notes (Signed)
Keith Cunningham is accompanied by wife Sources of clinical information for Cunningham is/are patient and spouse/SO. Nursing assessment for this office Cunningham was reviewed with the patient for accuracy and revision.     Previous Report(s) Reviewed: office notes  Depression screen Keith Cunningham 2/9 05/02/2021  Decreased Interest 0  Down, Depressed, Hopeless 0  PHQ - 2 Score 0  Altered sleeping 0  Tired, decreased energy 0  Change in appetite 0  Feeling bad or failure about yourself  0  Trouble concentrating 0  Moving slowly or fidgety/restless 0  Suicidal thoughts 0  PHQ-9 Score 0  Difficult doing work/chores Not difficult at all   Keith Cunningham from 05/02/2021 in [place] Office Cunningham from 09/10/2020 in [place] Office Cunningham from 09/03/2020 in [place]  Thoughts that you would be better off dead, or of hurting yourself in some way Not at all Not at all Not at all  PHQ-9 Total Score 0 0 0       Fall Risk  05/02/2021 09/03/2020 07/23/2020 07/09/2017  Falls in the past year? 0 0 0 No  Number falls in past yr: 0 0 0 -  Injury with Fall? 0 0 0 -    PHQ9 SCORE ONLY 05/02/2021 09/10/2020 09/03/2020  PHQ-9 Total Score 0 0 0    Adult vaccines due  Topic Date Due   TETANUS/TDAP  09/11/2030    Cunningham Maintenance Due  Topic Date Due   Pneumonia Vaccine 10+ Years old (1 - PCV) Never done   Zoster Vaccines- Shingrix (1 of 2) Never done   INFLUENZA VACCINE  Never done      History/P.E. limitations: none  Adult vaccines due  Topic Date Due   TETANUS/TDAP  09/11/2030   There are no preventive care reminders to display for this patient.  Cunningham Maintenance Due  Topic Date Due   Pneumonia Vaccine 18+ Years old (1 - PCV) Never done   Zoster Vaccines- Shingrix (1 of 2) Never done   INFLUENZA VACCINE  Never done     Chief Complaint  Patient presents with   Right side pain     ABDOMINAL PAIN  Location:  right lumbar abdominal quadrant Onset: Acute onset pain while walking at [facility] this morning.  Doubled him over, then reduced to 7/10 level that continues to the time of this Cunningham.   Radiation: none  Quality: Initially sharp like a knife Pattern: Thunderclap start then continuous Duration: ~ 2 hours  Better with: tried nothing    Symptoms Nausea/Vomiting: no  Diarrhea: no  Constipation: no  Melena/BRBPR: no  Hematemesis: no  Fever/Chills: no  Dysuria: no  NSAIDs/ASA: Baby ASA daily   Past Surgeries: no abdominal surgeries Past Abd/pelvic imaging: colonoscopy 2011 for IDA that found only hemorrhoids.   Exam Today's Vitals   05/02/21 1051  BP: (!) 150/78  Pulse: 91  SpO2: 99%  Weight: 158 lb 9.6 oz (71.9 kg)  Height: 5\' 6"  (1.676 m)  PainSc: 7   PainLoc: Generalized   Body mass index is 25.6 kg/m.  Gen: no acute disease Cor: RRR, no M/G Lung: BCTA, no Acc mm use Abdomin: (+) BS, soft, no palpable masses, right subchondral tenderness to expiration palpation, No HSM, no palpable bounding masses.  No inguinal LAN.  No scrotal distention.  Rectal: 2+ prostate size, soft stool in vault, no masses, brown soft stool on DRE finger.  Back: no CVAT  FOBT: Negative  A/P Acute Right Lumbar Abdominal Quadrant pain with right subchondral tenderness - CT AP with CM showed no acute processes.  There is cholelithiasis without cholecystitis.  There ia also prostamegaly and hepatitc steatosis - My working explanation is biliary colic.   I discussed with Mr and [name] the CT results by phone.  I advised them that there was nothing to do for now unless the symptom recurs, then general surgical consultation is a consideration.   - Awaiting results of Comprehensive Metabolic Panel/CBC/Lipase   CPT E&M Office Cunningham Time Before Cunningham; reviewing medical records (e.g. recent visits, labs, studies): 5 minutes During Cunningham (F2F time): 15 minutes After Cunningham (discussion with family  or HCP, prescribing, ordering, referring, calling result/recommendations or documenting on same day): 10 minutes Total Cunningham Time: 30 minutes

## 2021-05-03 LAB — BASIC METABOLIC PANEL
BUN/Creatinine Ratio: 14 (ref 10–24)
BUN: 12 mg/dL (ref 8–27)
CO2: 27 mmol/L (ref 20–29)
Calcium: 9.5 mg/dL (ref 8.6–10.2)
Chloride: 103 mmol/L (ref 96–106)
Creatinine, Ser: 0.84 mg/dL (ref 0.76–1.27)
Glucose: 109 mg/dL — ABNORMAL HIGH (ref 70–99)
Potassium: 4.1 mmol/L (ref 3.5–5.2)
Sodium: 140 mmol/L (ref 134–144)
eGFR: 89 mL/min/{1.73_m2} (ref >59)

## 2021-05-03 LAB — CBC
Hematocrit: 45.3 % (ref 37.5–51.0)
Hemoglobin: 15.8 g/dL (ref 13.0–17.7)
MCH: 32.1 pg (ref 26.6–33.0)
MCHC: 34.9 g/dL (ref 31.5–35.7)
MCV: 92 fL (ref 79–97)
Platelets: 202 10*3/uL (ref 150–450)
RBC: 4.92 x10E6/uL (ref 4.14–5.80)
RDW: 12.2 % (ref 11.6–15.4)
WBC: 6.4 10*3/uL (ref 3.4–10.8)

## 2021-05-03 LAB — LIPASE: Lipase: 28 U/L (ref 13–78)

## 2021-05-07 ENCOUNTER — Ambulatory Visit: Payer: Medicare Other | Admitting: Family Medicine

## 2021-05-07 ENCOUNTER — Other Ambulatory Visit: Payer: Self-pay

## 2021-05-07 ENCOUNTER — Ambulatory Visit (HOSPITAL_COMMUNITY)
Admission: RE | Admit: 2021-05-07 | Discharge: 2021-05-07 | Disposition: A | Discharge status: Home / Self Care | Payer: Medicare Other | Source: Ambulatory Visit | Attending: Family Medicine | Admitting: Family Medicine

## 2021-05-07 ENCOUNTER — Encounter: Payer: Self-pay | Admitting: Family Medicine

## 2021-05-07 DIAGNOSIS — R1031 Right lower quadrant pain: Secondary | ICD-10-CM | POA: Diagnosis not present

## 2021-05-07 DIAGNOSIS — K760 Fatty (change of) liver, not elsewhere classified: Secondary | ICD-10-CM | POA: Diagnosis not present

## 2021-05-07 DIAGNOSIS — R109 Unspecified abdominal pain: Secondary | ICD-10-CM | POA: Diagnosis not present

## 2021-05-07 NOTE — Assessment & Plan Note (Addendum)
Persistent subacute.  Pain is focal mid R side without peritoneal signs.  CT 5 days ago remarkable for gallstones without evidence of acute cholecystitis.  Unsure of cause.  Does not seem to be an acute abdomen.  No signs of renal stones or hernia.  Possible wall pain but history does not suggest.  Will recheck cbc and lipase and get LFTs.  Also RUQ [place].  Refer to general surgery

## 2021-05-07 NOTE — Progress Notes (Signed)
    SUBJECTIVE:   CHIEF COMPLAINT / HPI:   Abdomen Pain See Dr McDairmid note from 11/15.   He has continued to have R mid abdomen pain since then.  Not increasing or decreasing.  No fever or nausea and vomiting or diarrhea.  Does feel like his abdomen is getting bigger.  No change in urine or frequency   PERTINENT  PMH / PSH: CT showed only gall stones.  CBC and BMET and lipase unremarkable.  Do not see LFTs  OBJECTIVE:   BP (!) 142/88   Pulse 84   Wt 159 lb 9.6 oz (72.4 kg)   SpO2 98%   BMI 25.76 kg/m   Alert nad Mobility:able to get up and down from exam table without assistance or distress.  Has to prop neck due to prior injury Abdomen - soft focally tender in mid R abdomen.  No pain in RUQ or murphy's sign.  No hernia appreciated.  Pain unchanged with heel lifts.  No guarding or rebound. No CVAT  ASSESSMENT/PLAN:   Abdominal pain Persistent subacute.  Pain is focal mid R side without peritoneal signs.  CT 5 days ago remarkable for gallstones without evidence of acute cholecystitis.  Unsure of cause.  Does not seem to be an acute abdomen.  No signs of renal stones or hernia.  Possible wall pain but history does not suggest.  Will recheck cbc and lipase and get LFTs.  Also RUQ [place].  Refer to general surgery      Lind Covert, MD [place]

## 2021-05-07 NOTE — Patient Instructions (Addendum)
Good to see you today - Thank you for coming in  Things we discussed today:  Abdominal Pain  I have ordered blood tests and will be touch with the results  I have ordered an ultrasound of your gall bladder.  We will make you an appointment today.  I will be in touch with results  I have referred you to General Surgery. They should call you to make an appointment.    IF you have fever or severe nausea and vomiting or worsening pain you should go to the ER or call us immediately

## 2021-05-08 ENCOUNTER — Telehealth: Payer: Self-pay | Admitting: Family Medicine

## 2021-05-08 ENCOUNTER — Encounter (INDEPENDENT_AMBULATORY_CARE_PROVIDER_SITE_OTHER): Payer: Medicare Other | Admitting: Ophthalmology

## 2021-05-08 LAB — CBC
Hematocrit: 45.7 % (ref 37.5–51.0)
Hemoglobin: 15.7 g/dL (ref 13.0–17.7)
MCH: 32 pg (ref 26.6–33.0)
MCHC: 34.4 g/dL (ref 31.5–35.7)
MCV: 93 fL (ref 79–97)
Platelets: 184 10*3/uL (ref 150–450)
RBC: 4.9 x10E6/uL (ref 4.14–5.80)
RDW: 11.9 % (ref 11.6–15.4)
WBC: 6.3 10*3/uL (ref 3.4–10.8)

## 2021-05-08 LAB — HEPATIC FUNCTION PANEL
ALT: 22 IU/L (ref 0–44)
AST: 31 IU/L (ref 0–40)
Albumin: 4.5 g/dL (ref 3.7–4.7)
Alkaline Phosphatase: 72 IU/L (ref 44–121)
Bilirubin Total: 0.4 mg/dL (ref 0.0–1.2)
Bilirubin, Direct: 0.13 mg/dL (ref 0.00–0.40)
Total Protein: 6.6 g/dL (ref 6.0–8.5)

## 2021-05-08 LAB — LIPASE: Lipase: 33 U/L (ref 13–78)

## 2021-05-08 NOTE — Telephone Encounter (Signed)
He is feeling the same  Told them the tests were not revealing  Recommend they call [facility] to see if will take their insurnace  Recommend if worsening to call or go to the ER  Gave them my cell for sudden questions

## 2021-05-15 ENCOUNTER — Telehealth: Payer: Self-pay | Admitting: *Deleted

## 2021-05-15 ENCOUNTER — Ambulatory Visit: Payer: Self-pay | Admitting: Surgery

## 2021-05-15 DIAGNOSIS — K8020 Calculus of gallbladder without cholecystitis without obstruction: Secondary | ICD-10-CM | POA: Diagnosis not present

## 2021-05-15 NOTE — H&P (View-Only) (Signed)
History of Present Illness: Keith Cunningham is a 80 y.o. male who was referred to me for evaluation of abdominal pain and gallstones.  About 2 weeks ago he had sudden onset of right-sided abdominal pain.  He initially thought it might be his appendix and went to his primary care doctor.  He ultimately underwent a CT scan on 11/17, which showed cholelithiasis but no evidence of cholecystitis or appendicitis, and no other abnormalities to explain his abdominal pain.  His pain has persisted and he says it is constant but gets worse with certain positions.  He is able to eat but endorses significant bloating which worsens after he eats.  He has not had any fevers or chills.  His pain is associated with nausea but no vomiting.  It is located in the right upper quadrant and radiates to the right shoulder.  He had an ultrasound on 11/22, which confirmed cholelithiasis without signs of acute cholecystitis.  LFTs and lipase were normal.  He has been referred to me to discuss cholecystectomy.   He has not had any prior abdominal surgeries.  He does have a history of coronary artery disease and underwent a CABG in 2013.  His cardiologist is Dr. Daneen Schick who he sees annually.  He is on a baby aspirin daily but does not take any other antiplatelets or anticoagulants.  He has had multiple orthopedic surgeries since his CABG and has not had any issues with anesthesia during the surgeries.     Review of Systems: A complete review of systems was obtained from the patient.  I have reviewed this information and discussed as appropriate with the patient.  See HPI as well for other ROS.     Medical History: Past Medical History Past Medical History: Diagnosis Date  Coronary artery disease    History of myocardial infarction    Hyperlipidemia    Melanoma (CMS-HCC)     scalp      Patient Active Problem List Diagnosis  Calculus of gallbladder without cholecystitis without obstruction     Past Surgical  History Past Surgical History: Procedure Laterality Date  CORONARY ARTERY BYPASS GRAFT      heart surgery   2013  JOINT REPLACEMENT      melanoma excision      Remroide   2011      Allergies Allergies Allergen Reactions  Atorvastatin Nausea And Vomiting and Other (See Comments)     CONSTIPATION FATIGUE  Doxycycline Rash  Codeine Nausea And Vomiting      Current Outpatient Medications on File Prior to Visit Medication Sig Dispense Refill  aspirin 81 MG EC tablet Take 1 tablet (81 mg total) by mouth once daily      co-enzyme Q-10, ubiquinone, 200 mg capsule Take by mouth      omega-3 fatty acids-fish oil 300-500 mg Cap Take by mouth      rosuvastatin (CRESTOR) 5 MG tablet Take 1 tablet (5 mg total) by mouth once daily       No current facility-administered medications on file prior to visit.     Family History Family History Problem Relation Age of Onset  Stroke Mother    Coronary Artery Disease (Blocked arteries around heart) Father        Social History   Tobacco Use Smoking Status Never Smokeless Tobacco Never     Social History Social History    Socioeconomic History  Marital status: Unknown Tobacco Use  Smoking status: Never  Smokeless tobacco: Never Substance and Sexual  Activity  Alcohol use: Never  Drug use: Never      Objective:     Vitals:   05/15/21 1045 BP: 122/80 Pulse: 100 Temp: 37.1 C (98.7 F) SpO2: 98% Weight: 72.1 kg (159 lb) Height: 165.1 cm (5\' 5" )   Body mass index is 26.46 kg/m.   Physical Exam Vitals reviewed.  Constitutional:      General: He is not in acute distress.    Appearance: Normal appearance.  HENT:     Head: Normocephalic and atraumatic.  Eyes:     General: No scleral icterus.    Conjunctiva/sclera: Conjunctivae normal.  Cardiovascular:     Rate and Rhythm: Normal rate and regular rhythm.     Comments: Well-healed sternotomy scar. Pulmonary:     Effort: Pulmonary effort is normal. No respiratory  distress.     Breath sounds: Normal breath sounds. No wheezing.  Abdominal:     General: There is no distension.     Palpations: Abdomen is soft.     Tenderness: There is no abdominal tenderness.     Comments: No surgical scars.  Abdomen is soft and mildly tender to palpation in the right upper quadrant.  Musculoskeletal:        General: No swelling. Normal range of motion.     Cervical back: Normal range of motion.  Skin:    General: Skin is warm and dry.     Coloration: Skin is not jaundiced.  Neurological:     General: No focal deficit present.     Mental Status: He is alert and oriented to person, place, and time.  Psychiatric:        Mood and Affect: Mood normal.        Behavior: Behavior normal.        Thought Content: Thought content normal.            Assessment and Plan: Diagnoses and all orders for this visit:   Calculus of gallbladder without cholecystitis without obstruction       This is a 80 year old male who has been referred for persistent right upper quadrant abdominal pain that began about 2 weeks ago.  The sudden onset is somewhat atypical of biliary colic, however the location in the right upper quadrant with radiation to the shoulder and associated nausea and bloating is more consistent with biliary colic.  In addition there are no other imaging or lab abnormalities to explain his symptoms.  I personally reviewed his ultrasound and his CT scan.  He has multiple stones within the neck of the gallbladder, but no pericholecystic fluid or stranding to suggest acute cholecystitis.  He has no signs of biliary obstruction and his LFTs are normal.  He does not have any other intra-abdominal abnormalities to explain his pain on CT, there is no sign of rectus sheath hematoma.  I think the gallbladder is the most likely source of his pain, but I did discuss it is possible there is a different etiology and that surgery may not completely resolve his pain. Laparoscopic  cholecystectomy was recommended. The details of this procedure were discussed with the patient, including the risks of bleeding, infection, bile leak, and <0.5% risk of common bile duct injury. The patient expressed understanding and agrees to proceed with surgery.  We will obtain medical clearance from his cardiologist and schedule surgery as soon as possible given the frequency and severity of his symptoms.  He does not need to hold his aspirin prior to surgery and should continue  taking it throughout the perioperative period.   Michaelle Birks, MD [facility] Surgery General, Hepatobiliary and Pancreatic Surgery 05/15/21 11:29 AM

## 2021-05-15 NOTE — Telephone Encounter (Signed)
   Pre-operative Risk Assessment    Patient Name: Keith Cunningham  DOB: [birth year] MRN: [id number]      Request for Surgical Clearance   Procedure:   LAPAROSCOPIC CHOLECYSTECTOMY  Date of Surgery: Clearance TBD [place]                                 Surgeon:  DR. Michaelle Birks Surgeon's Group or Practice Name:  [facility] Phone number:  [phone] Fax number:  [phone] ATTN: Emeline Gins, CMA   Type of Clearance Requested: - Medical  - Pharmacy:  Hold Aspirin     Type of Anesthesia:   General    Additional requests/questions:   Keith Cunningham   05/15/2021, 1:42 PM

## 2021-05-15 NOTE — H&P (Signed)
History of Present Illness: Keith Cunningham is a 80 y.o. male who was referred to me for evaluation of abdominal pain and gallstones.  About 2 weeks ago he had sudden onset of right-sided abdominal pain.  He initially thought it might be his appendix and went to his primary care doctor.  He ultimately underwent a CT scan on 11/17, which showed cholelithiasis but no evidence of cholecystitis or appendicitis, and no other abnormalities to explain his abdominal pain.  His pain has persisted and he says it is constant but gets worse with certain positions.  He is able to eat but endorses significant bloating which worsens after he eats.  He has not had any fevers or chills.  His pain is associated with nausea but no vomiting.  It is located in the right upper quadrant and radiates to the right shoulder.  He had an ultrasound on 11/22, which confirmed cholelithiasis without signs of acute cholecystitis.  LFTs and lipase were normal.  He has been referred to me to discuss cholecystectomy.   He has not had any prior abdominal surgeries.  He does have a history of coronary artery disease and underwent a CABG in 2013.  His cardiologist is Dr. Daneen Schick who he sees annually.  He is on a baby aspirin daily but does not take any other antiplatelets or anticoagulants.  He has had multiple orthopedic surgeries since his CABG and has not had any issues with anesthesia during the surgeries.     Review of Systems: A complete review of systems was obtained from the patient.  I have reviewed this information and discussed as appropriate with the patient.  See HPI as well for other ROS.     Medical History: Past Medical History Past Medical History: Diagnosis Date  Coronary artery disease    History of myocardial infarction    Hyperlipidemia    Melanoma (CMS-HCC)     scalp      Patient Active Problem List Diagnosis  Calculus of gallbladder without cholecystitis without obstruction     Past Surgical  History Past Surgical History: Procedure Laterality Date  CORONARY ARTERY BYPASS GRAFT      heart surgery   2013  JOINT REPLACEMENT      melanoma excision      Remroide   2011      Allergies Allergies Allergen Reactions  Atorvastatin Nausea And Vomiting and Other (See Comments)     CONSTIPATION FATIGUE  Doxycycline Rash  Codeine Nausea And Vomiting      Current Outpatient Medications on File Prior to Visit Medication Sig Dispense Refill  aspirin 81 MG EC tablet Take 1 tablet (81 mg total) by mouth once daily      co-enzyme Q-10, ubiquinone, 200 mg capsule Take by mouth      omega-3 fatty acids-fish oil 300-500 mg Cap Take by mouth      rosuvastatin (CRESTOR) 5 MG tablet Take 1 tablet (5 mg total) by mouth once daily       No current facility-administered medications on file prior to visit.     Family History Family History Problem Relation Age of Onset  Stroke Mother    Coronary Artery Disease (Blocked arteries around heart) Father        Social History   Tobacco Use Smoking Status Never Smokeless Tobacco Never     Social History Social History    Socioeconomic History  Marital status: Unknown Tobacco Use  Smoking status: Never  Smokeless tobacco: Never Substance and Sexual  Activity  Alcohol use: Never  Drug use: Never      Objective:     Vitals:   05/15/21 1045 BP: 122/80 Pulse: 100 Temp: 37.1 C (98.7 F) SpO2: 98% Weight: 72.1 kg (159 lb) Height: 165.1 cm (5\' 5" )   Body mass index is 26.46 kg/m.   Physical Exam Vitals reviewed.  Constitutional:      General: He is not in acute distress.    Appearance: Normal appearance.  HENT:     Head: Normocephalic and atraumatic.  Eyes:     General: No scleral icterus.    Conjunctiva/sclera: Conjunctivae normal.  Cardiovascular:     Rate and Rhythm: Normal rate and regular rhythm.     Comments: Well-healed sternotomy scar. Pulmonary:     Effort: Pulmonary effort is normal. No respiratory  distress.     Breath sounds: Normal breath sounds. No wheezing.  Abdominal:     General: There is no distension.     Palpations: Abdomen is soft.     Tenderness: There is no abdominal tenderness.     Comments: No surgical scars.  Abdomen is soft and mildly tender to palpation in the right upper quadrant.  Musculoskeletal:        General: No swelling. Normal range of motion.     Cervical back: Normal range of motion.  Skin:    General: Skin is warm and dry.     Coloration: Skin is not jaundiced.  Neurological:     General: No focal deficit present.     Mental Status: He is alert and oriented to person, place, and time.  Psychiatric:        Mood and Affect: Mood normal.        Behavior: Behavior normal.        Thought Content: Thought content normal.            Assessment and Plan: Diagnoses and all orders for this visit:   Calculus of gallbladder without cholecystitis without obstruction       This is a 80 year old male who has been referred for persistent right upper quadrant abdominal pain that began about 2 weeks ago.  The sudden onset is somewhat atypical of biliary colic, however the location in the right upper quadrant with radiation to the shoulder and associated nausea and bloating is more consistent with biliary colic.  In addition there are no other imaging or lab abnormalities to explain his symptoms.  I personally reviewed his ultrasound and his CT scan.  He has multiple stones within the neck of the gallbladder, but no pericholecystic fluid or stranding to suggest acute cholecystitis.  He has no signs of biliary obstruction and his LFTs are normal.  He does not have any other intra-abdominal abnormalities to explain his pain on CT, there is no sign of rectus sheath hematoma.  I think the gallbladder is the most likely source of his pain, but I did discuss it is possible there is a different etiology and that surgery may not completely resolve his pain. Laparoscopic  cholecystectomy was recommended. The details of this procedure were discussed with the patient, including the risks of bleeding, infection, bile leak, and <0.5% risk of common bile duct injury. The patient expressed understanding and agrees to proceed with surgery.  We will obtain medical clearance from his cardiologist and schedule surgery as soon as possible given the frequency and severity of his symptoms.  He does not need to hold his aspirin prior to surgery and should continue  taking it throughout the perioperative period.   Michaelle Birks, MD [facility] [place] Surgery General, Hepatobiliary and Pancreatic Surgery 05/15/21 11:29 AM

## 2021-05-15 NOTE — Telephone Encounter (Signed)
    Patient Name: Keith Cunningham  DOB: Jun 29, 1940 MRN: [id number]  Primary Cardiologist: Sinclair Grooms, MD  Chart reviewed as part of pre-operative protocol coverage. Patient was last seen by Dr. Tamala Julian in 06/2020 at which time he was doing well from cardiac standpoint. Patient was contacted today for further pre-op evaluation and reported doing well since last visit. He denies any chest pain, shortness of breath, orthopnea/PND, palpitations, lightheadedness, dizziness, or syncope. He stays active and is easily able to complete >4.0 METS of activity without any problems. Given past medical history and time since last visit, based on ACC/AHA guidelines, STROTHER EVERITT would be at acceptable risk for the planned procedure without further cardiovascular testing.   Regarding Aspirin therapy, we typically recommend continuation of Aspirin throughout the perioperative period.  However, if the surgeon feels that cessation of Aspirin is required in the perioperative period, it may be stopped 5-7 days prior to surgery with a plan to resume it as soon as felt to be feasible from a surgical standpoint in the post-operative period.  I will route this recommendation to the requesting party via Epic fax function and remove from pre-op pool.  Please call with questions.  Darreld Mclean, PA-C 05/15/2021, 2:29 PM

## 2021-05-22 ENCOUNTER — Encounter (HOSPITAL_COMMUNITY): Payer: Self-pay | Admitting: Surgery

## 2021-05-22 NOTE — Anesthesia Preprocedure Evaluation (Addendum)
Anesthesia Evaluation  Patient identified by MRN, date of birth, ID band Patient awake    Reviewed: Allergy & Precautions, NPO status , Patient's Chart, lab work & pertinent test results  History of Anesthesia Complications Negative for: history of anesthetic complications  Airway Mallampati: III  TM Distance: >3 FB Neck ROM: Full    Dental  (+) Dental Advisory Given, Teeth Intact   Pulmonary neg shortness of breath, neg sleep apnea, neg COPD, neg recent URI,    breath sounds clear to auscultation       Cardiovascular (-) hypertension(-) angina+ CAD, + Past MI and + CABG  (-) dysrhythmias  Rhythm:Regular  History includes never smoker, CAD (NSTEMI, s/p CABG x4: LIMA-LAD, SVG-INT-dCX, SVG-PDA 05/10/12), HLD, hiatal hernia, anemia, essential tremor, 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   Preoperative cardiology input outlined on 05/15/2021 by Sande Rives, PA-C,  "Patient was last seen by Dr. Tamala Julian in 06/2020 at which time he was doing well from cardiac standpoint. Patient was contacted today for further pre-op evaluation and reported doing well since last visit. He denies any chest pain, shortness of breath, orthopnea/PND, palpitations, lightheadedness, dizziness, or syncope. He stays active and is easily able to complete >4.0 METS of activity without any problems.Given past medical history and time since last visit, based on ACC/AHA guidelines,Keith R Saunderswould be at acceptable risk for the planned procedure without further cardiovascular testing.   Regarding Aspirintherapy, we typicallyrecommend continuation of Aspirinthroughout the perioperative period. However, if the surgeon feels that cessation of Aspirinis required in the perioperative period, it may be stopped 5-7 days prior to surgery with a plan to resume it as soon as felt  to be feasible from a surgical standpoint in the post-operative period."   Neuro/Psych negative neurological ROS  negative psych ROS   GI/Hepatic Neg liver ROS, hiatal hernia, CHOLELITHIASIS VERY SYMPTOMATIC   Endo/Other  negative endocrine ROS  Renal/GU negative Renal ROSLab Results      Component                Value               Date                      CREATININE               0.84                05/02/2021                Musculoskeletal  (+) Arthritis ,   Abdominal   Peds  Hematology negative hematology ROS (+) Lab Results      Component                Value               Date                      WBC                      6.3                 05/07/2021                HGB  15.7                05/07/2021                HCT                      45.7                05/07/2021                MCV                      93                  05/07/2021                PLT                      184                 05/07/2021              Anesthesia Other Findings   Reproductive/Obstetrics                             Anesthesia Physical Anesthesia Plan  ASA: 2  Anesthesia Plan: General   Post-op Pain Management: Tylenol PO (pre-op)   Induction: Intravenous  PONV Risk Score and Plan: 2 and Ondansetron and Dexamethasone  Airway Management Planned: Oral ETT  Additional Equipment: None  Intra-op Plan:   Post-operative Plan: Extubation in OR  Informed Consent: I have reviewed the patients History and Physical, chart, labs and discussed the procedure including the risks, benefits and alternatives for the proposed anesthesia with the patient or authorized representative who has indicated his/her understanding and acceptance.     Dental advisory given  Plan Discussed with: CRNA and Anesthesiologist  Anesthesia Plan Comments: (PAT note written 05/22/2021 by Myra Gianotti, PA-C. )       Anesthesia Quick Evaluation

## 2021-05-22 NOTE — Progress Notes (Signed)
DUE TO COVID-19 ONLY ONE VISITOR IS ALLOWED TO COME WITH YOU AND STAY IN THE WAITING ROOM ONLY DURING PRE OP AND PROCEDURE DAY OF SURGERY.   PCP - Dr Zola Button Cardiologist - Dr Daneen Schick Neurologist - Dr Arlice Colt  Chest x-ray - 06/17/18 (2V) EKG - 06/22/20 Stress Test - 05/06/12 ABN ECHO - 05/10/12 Cardiac Cath - 05/07/12  ICD Pacemaker/Loop - n/a  Sleep Study -  n/a CPAP - none  Aspirin Instructions: Follow your surgeon's instructions on when to stop aspirin prior to surgery,  If no instructions were given by your surgeon then you will need to call the office for those instructions.  Anesthesia review: Yes  STOP now taking any Aspirin (unless otherwise instructed by your surgeon), Aleve, Naproxen, Ibuprofen, Motrin, Advil, Goody's, BC's, all herbal medications, fish oil, and all vitamins.   Coronavirus Screening Covid test n/a Ambulatory Surgery  Do you have any of the following symptoms:  Cough yes/no: No Fever (>100.66F)  yes/no: No Runny nose yes/no: No Sore throat yes/no: No Difficulty breathing/shortness of breath  yes/no: No  Have you traveled in the last 14 days and where? yes/no: No  S.O. Karrie Meres verbalized understanding of instructions that were given via phone.

## 2021-05-22 NOTE — Progress Notes (Signed)
Anesthesia Chart Review: Kathleene Hazel  Case: [id number] Date/Time: 05/23/21 0845   Procedure: LAPAROSCOPIC CHOLECYSTECTOMY   Anesthesia type: General   Pre-op diagnosis: CHOLELITHIASIS VERY SYMPTOMATIC   Location: [facility] ROOM 09 / [facility] OR   Surgeons: Dwan Bolt, MD       DISCUSSION: Patient is a 80 year old male scheduled for the above procedure.   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    Preoperative cardiology input outlined on 05/15/2021 by Sande Rives, PA-C,  "Patient was last seen by Dr. Tamala Julian in 06/2020 at which time he was doing well from cardiac standpoint. Patient was contacted today for further pre-op evaluation and reported doing well since last visit. He denies any chest pain, shortness of breath, orthopnea/PND, palpitations, lightheadedness, dizziness, or syncope. He stays active and is easily able to complete >4.0 METS of activity without any problems. Given past medical history and time since last visit, based on ACC/AHA guidelines, JERRICO COVELLO would be at acceptable risk for the planned procedure without further cardiovascular testing.    Regarding Aspirin therapy, we typically recommend continuation of Aspirin throughout the perioperative period.  However, if the surgeon feels that cessation of Aspirin is required in the perioperative period, it may be stopped 5-7 days prior to surgery with a plan to resume it as soon as felt to be feasible from a surgical standpoint in the post-operative period."  He had labs on 05/02/2021 and 05/07/2021.  Anesthesia team to evaluate on the day of surgery.  VS:  BP Readings from Last 3 Encounters:  05/07/21 (!) 142/88  05/02/21 (!) 150/78  09/10/20 (!) 156/82   Pulse Readings from Last 3  Encounters:  05/07/21 84  05/02/21 91  09/10/20 98     PROVIDERS: Zola Button, MD is PCP  Daneen Schick, MD is cardiologist Arlice Colt, MD is neurologist. Last visit 12/3/200. "  No source of progressive visual loss or hearing loss noted on MRI.  He has chronic microvascular ischemic changes.  Optic nerves and 8th nerves look fine." Mysoline poorly tolerated for essential tremor, consider b-blocker if okay with cardiology. As need follow-up recommended.   LABS: Most recent lab results include: Lab Results  Component Value Date   WBC 6.3 05/07/2021   HGB 15.7 05/07/2021   HCT 45.7 05/07/2021   PLT 184 05/07/2021   GLUCOSE 109 (H) 05/02/2021   ALT 22 05/07/2021   AST 31 05/07/2021   NA 140 05/02/2021   K 4.1 05/02/2021   CL 103 05/02/2021   CREATININE 0.84 05/02/2021   BUN 12 05/02/2021   CO2 27 05/02/2021   HGBA1C 5.6 07/23/2020     IMAGES: [place] Abd 05/07/21: IMPRESSION: Gallbladder stones. There are no sonographic signs of acute cholecystitis. Hepatic steatosis.   CT Abd/pelvis 05/02/21: IMPRESSION: 1. No acute CT findings of the abdomen or pelvis to explain right upper quadrant pain. 2. Hepatic steatosis. 3. Cholelithiasis without evidence of acute cholecystitis. 4. Prostatomegaly. Evaluation of the low pelvis is significantly limited by dense metallic streak artifact from bilateral hip arthroplasty. 5. Coronary artery disease. - Aortic Atherosclerosis (ICD10-I70.0).    EKG: 06/22/20 ([facility]) Normal sinus rhythm Incomplete right bundle branch block Left anterior hemiblock   CV: TEE (Intra-Op CABG) 05/10/2012 Prebypass examination revealed the left ventricle to be concentrically, mildly  hypertrophied.  There were no segmental defects.  Contractility was good overall.  Left atrium was normal size.  The appendage was clean.  Interatrial septum was intact on color Doppler.  Mitral valve had two leaflets, opening and closing appropriately collapsing  the valvular plane.  There were no masses or vegetations noted.  Color Doppler revealed a trace central regurgitant flow.  The aortic valve had three leaflets, all opening and closing appropriately.  No calcifications were noted.  Color Doppler had no aortic insufficiency. The tricuspid valve also appeared normal.  There was a trace central regurgitant flow with the Swan-Ganz catheter noted across the valve. Right heart exam was otherwise normal.  - There were no new findings, postbypass.  Cardiac Cath (PRE-CABG) 05/07/2012  IMPRESSION: 1. Ostial LAD and ramus obstruction accounting for the early positive exercise treadmill test. 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  2. Normal left ventricular function  3. Early positive exercise treadmill test with progression of symptoms and clinical unstable angina with episodes of pain at rest. - S/p CABG x4 05/10/2012   US Carotid 05/07/2012 Summary:  - No significant extracranial carotid artery stenosis    demonstrated. Vertebrals are patent with antegrade flow.    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    Staph infection 03/2017   had I and D of infected abscess     Past Surgical History:  Procedure Laterality Date   CARDIAC CATHETERIZATION  04/21/2012   CATARACT EXTRACTION, BILATERAL Bilateral    CORONARY  ARTERY BYPASS GRAFT  05/10/2012   Procedure: CORONARY ARTERY BYPASS GRAFTING (CABG);  Surgeon: Grace Isaac, MD;  Location: [facility];  Service: Open Heart Surgery;  Laterality: N/A;  times three using Left Greater Saphenous Vein Graft harvested endoscopically and Left Internal Mammary Artery   EXCISIONAL HEMORRHOIDECTOMY  2012   "tried to shrink 1st w/needle; cut them out 2 wk later" (05/06/2012)   INCISION AND DRAINAGE ABSCESS Right 03/25/2017   Procedure: INCISION AND DRAINAGE ABSCESS;  Surgeon: Jovita Kussmaul, MD;  Location: [facility];  Service: General;  Laterality: Right;   INTRAOPERATIVE TRANSESOPHAGEAL ECHOCARDIOGRAM  05/10/2012   Procedure: INTRAOPERATIVE TRANSESOPHAGEAL ECHOCARDIOGRAM;  Surgeon: Grace Isaac, MD;  Location: [facility];  Service: Open Heart Surgery;  Laterality: N/A;   MELANOMA EXCISION  2009   "left side of head" (05/06/2012)   TONSILLECTOMY AND ADENOIDECTOMY  ~ 1950   TOTAL HIP ARTHROPLASTY Left 05/26/2016   Procedure: TOTAL HIP ARTHROPLASTY ANTERIOR APPROACH;  Surgeon: Dorna Leitz, MD;  Location: [facility];  Service: Orthopedics;  Laterality: Left;   TOTAL HIP ARTHROPLASTY Right 06/25/2018   Procedure: TOTAL HIP ARTHROPLASTY ANTERIOR APPROACH;  Surgeon: Dorna Leitz, MD;  Location: [facility];  Service: Orthopedics;  Laterality: Right;  MEDICATIONS: No current facility-administered medications for this encounter.    acetaminophen (TYLENOL) 500 MG tablet   aspirin 81 MG EC tablet   Coenzyme Q10 (COQ10) 200 MG CAPS   Omega-3 Fatty Acids (FISH OIL) 500 MG CAPS   rosuvastatin (CRESTOR) 5 MG tablet    Myra Gianotti, PA-C Surgical Short Stay/Anesthesiology [facility] Phone [phone] [facility] Phone [phone] 05/22/2021 11:10 AM

## 2021-05-23 ENCOUNTER — Ambulatory Visit (HOSPITAL_COMMUNITY): Payer: Medicare Other | Admitting: Vascular Surgery

## 2021-05-23 ENCOUNTER — Ambulatory Visit (HOSPITAL_COMMUNITY)
Admission: RE | Admit: 2021-05-23 | Discharge: 2021-05-23 | Disposition: A | Discharge status: Home / Self Care | Payer: Medicare Other | Attending: Surgery | Admitting: Surgery

## 2021-05-23 ENCOUNTER — Encounter (HOSPITAL_COMMUNITY)
Admission: RE | Disposition: A | Discharge status: Home / Self Care | Payer: Self-pay | Source: Home / Self Care | Attending: Surgery

## 2021-05-23 ENCOUNTER — Other Ambulatory Visit: Payer: Self-pay

## 2021-05-23 ENCOUNTER — Encounter (HOSPITAL_COMMUNITY): Payer: Self-pay | Admitting: Surgery

## 2021-05-23 DIAGNOSIS — E785 Hyperlipidemia, unspecified: Secondary | ICD-10-CM | POA: Diagnosis not present

## 2021-05-23 DIAGNOSIS — I25110 Atherosclerotic heart disease of native coronary artery with unstable angina pectoris: Secondary | ICD-10-CM | POA: Diagnosis not present

## 2021-05-23 DIAGNOSIS — M1990 Unspecified osteoarthritis, unspecified site: Secondary | ICD-10-CM | POA: Insufficient documentation

## 2021-05-23 DIAGNOSIS — Z951 Presence of aortocoronary bypass graft: Secondary | ICD-10-CM | POA: Insufficient documentation

## 2021-05-23 DIAGNOSIS — Z85820 Personal history of malignant melanoma of skin: Secondary | ICD-10-CM | POA: Insufficient documentation

## 2021-05-23 DIAGNOSIS — I252 Old myocardial infarction: Secondary | ICD-10-CM | POA: Diagnosis not present

## 2021-05-23 DIAGNOSIS — K8010 Calculus of gallbladder with chronic cholecystitis without obstruction: Secondary | ICD-10-CM | POA: Diagnosis not present

## 2021-05-23 DIAGNOSIS — K8020 Calculus of gallbladder without cholecystitis without obstruction: Secondary | ICD-10-CM | POA: Diagnosis not present

## 2021-05-23 HISTORY — PX: CHOLECYSTECTOMY: SHX55

## 2021-05-23 SURGERY — LAPAROSCOPIC CHOLECYSTECTOMY
Anesthesia: General | Site: Abdomen

## 2021-05-23 MED ORDER — DEXAMETHASONE SODIUM PHOSPHATE 10 MG/ML IJ SOLN
INTRAMUSCULAR | Status: DC | PRN
  Administered 2021-05-23: 10 mg via INTRAVENOUS

## 2021-05-23 MED ORDER — ACETAMINOPHEN 500 MG PO TABS
ORAL_TABLET | ORAL | Status: AC
  Administered 2021-05-23: 1000 mg via ORAL
  Filled 2021-05-23: qty 2

## 2021-05-23 MED ORDER — LIDOCAINE 2% (20 MG/ML) 5 ML SYRINGE
INTRAMUSCULAR | Status: AC
  Filled 2021-05-23: qty 5

## 2021-05-23 MED ORDER — OXYCODONE HCL 5 MG/5ML PO SOLN: 5.0000 mg | Freq: Once | ORAL | Status: DC | PRN

## 2021-05-23 MED ORDER — FENTANYL CITRATE (PF) 250 MCG/5ML IJ SOLN
INTRAMUSCULAR | Status: AC
  Filled 2021-05-23: qty 5

## 2021-05-23 MED ORDER — GLYCOPYRROLATE 0.2 MG/ML IJ SOLN
INTRAMUSCULAR | Status: DC | PRN
  Administered 2021-05-23: .2 mg via INTRAVENOUS

## 2021-05-23 MED ORDER — CHLORHEXIDINE GLUCONATE 0.12 % MT SOLN
OROMUCOSAL | Status: AC
  Administered 2021-05-23: 15 mL via OROMUCOSAL
  Filled 2021-05-23: qty 15

## 2021-05-23 MED ORDER — MIDAZOLAM HCL 2 MG/2ML IJ SOLN
INTRAMUSCULAR | Status: AC
  Filled 2021-05-23: qty 2

## 2021-05-23 MED ORDER — BUPIVACAINE HCL (PF) 0.25 % IJ SOLN
INTRAMUSCULAR | Status: DC | PRN
  Administered 2021-05-23: 30 mL

## 2021-05-23 MED ORDER — CEFAZOLIN SODIUM-DEXTROSE 2-4 GM/100ML-% IV SOLN
INTRAVENOUS | Status: AC
  Filled 2021-05-23: qty 100

## 2021-05-23 MED ORDER — ROCURONIUM BROMIDE 10 MG/ML (PF) SYRINGE
PREFILLED_SYRINGE | INTRAVENOUS | Status: DC | PRN
  Administered 2021-05-23: 70 mg via INTRAVENOUS

## 2021-05-23 MED ORDER — DEXAMETHASONE SODIUM PHOSPHATE 10 MG/ML IJ SOLN
INTRAMUSCULAR | Status: AC
  Filled 2021-05-23: qty 1

## 2021-05-23 MED ORDER — CHLORHEXIDINE GLUCONATE 0.12 % MT SOLN: 15.0000 mL | Freq: Once | OROMUCOSAL | Status: AC

## 2021-05-23 MED ORDER — ORAL CARE MOUTH RINSE: 15.0000 mL | Freq: Once | OROMUCOSAL | Status: AC

## 2021-05-23 MED ORDER — DEXMEDETOMIDINE (PRECEDEX) IN NS 20 MCG/5ML (4 MCG/ML) IV SYRINGE
PREFILLED_SYRINGE | INTRAVENOUS | Status: DC | PRN
  Administered 2021-05-23: 12 ug via INTRAVENOUS

## 2021-05-23 MED ORDER — HYDROCODONE-ACETAMINOPHEN 5-325 MG PO TABS: 1.0000 | ORAL_TABLET | Freq: Four times a day (QID) | ORAL | 0 refills | Status: DC | PRN

## 2021-05-23 MED ORDER — LACTATED RINGERS IV SOLN: INTRAVENOUS | Status: DC

## 2021-05-23 MED ORDER — FENTANYL CITRATE (PF) 100 MCG/2ML IJ SOLN: 25.0000 ug | INTRAMUSCULAR | Status: DC | PRN

## 2021-05-23 MED ORDER — ACETAMINOPHEN 500 MG PO TABS: 1000.0000 mg | ORAL_TABLET | Freq: Once | ORAL | Status: DC | PRN

## 2021-05-23 MED ORDER — ACETAMINOPHEN 500 MG PO TABS: 1000.0000 mg | ORAL_TABLET | ORAL | Status: AC

## 2021-05-23 MED ORDER — 0.9 % SODIUM CHLORIDE (POUR BTL) OPTIME
TOPICAL | Status: DC | PRN
  Administered 2021-05-23: 1000 mL

## 2021-05-23 MED ORDER — BUPIVACAINE HCL (PF) 0.25 % IJ SOLN
INTRAMUSCULAR | Status: AC
  Filled 2021-05-23: qty 30

## 2021-05-23 MED ORDER — ACETAMINOPHEN 160 MG/5ML PO SOLN: 1000.0000 mg | Freq: Once | ORAL | Status: DC | PRN

## 2021-05-23 MED ORDER — ROCURONIUM BROMIDE 10 MG/ML (PF) SYRINGE
PREFILLED_SYRINGE | INTRAVENOUS | Status: AC
  Filled 2021-05-23: qty 10

## 2021-05-23 MED ORDER — CEFAZOLIN SODIUM-DEXTROSE 2-4 GM/100ML-% IV SOLN
2.0000 g | INTRAVENOUS | Status: AC
  Administered 2021-05-23: 2 g via INTRAVENOUS

## 2021-05-23 MED ORDER — ACETAMINOPHEN 10 MG/ML IV SOLN: 1000.0000 mg | Freq: Once | INTRAVENOUS | Status: DC | PRN

## 2021-05-23 MED ORDER — ONDANSETRON HCL 4 MG/2ML IJ SOLN
INTRAMUSCULAR | Status: DC | PRN
  Administered 2021-05-23: 4 mg via INTRAVENOUS

## 2021-05-23 MED ORDER — OXYCODONE HCL 5 MG PO TABS: 5.0000 mg | ORAL_TABLET | Freq: Once | ORAL | Status: DC | PRN

## 2021-05-23 MED ORDER — PROPOFOL 10 MG/ML IV BOLUS
INTRAVENOUS | Status: DC | PRN
  Administered 2021-05-23: 120 mg via INTRAVENOUS

## 2021-05-23 MED ORDER — PHENYLEPHRINE 40 MCG/ML (10ML) SYRINGE FOR IV PUSH (FOR BLOOD PRESSURE SUPPORT)
PREFILLED_SYRINGE | INTRAVENOUS | Status: DC | PRN
  Administered 2021-05-23: 80 ug via INTRAVENOUS

## 2021-05-23 MED ORDER — LIDOCAINE 2% (20 MG/ML) 5 ML SYRINGE
INTRAMUSCULAR | Status: DC | PRN
  Administered 2021-05-23: 60 mg via INTRAVENOUS

## 2021-05-23 MED ORDER — SODIUM CHLORIDE 0.9 % IR SOLN
Status: DC | PRN
  Administered 2021-05-23: 1000 mL

## 2021-05-23 MED ORDER — FENTANYL CITRATE (PF) 250 MCG/5ML IJ SOLN
INTRAMUSCULAR | Status: DC | PRN
  Administered 2021-05-23 (×2): 50 ug via INTRAVENOUS
  Administered 2021-05-23: 100 ug via INTRAVENOUS

## 2021-05-23 MED ORDER — ONDANSETRON HCL 4 MG/2ML IJ SOLN
INTRAMUSCULAR | Status: AC
  Filled 2021-05-23: qty 2

## 2021-05-23 MED ORDER — SUGAMMADEX SODIUM 200 MG/2ML IV SOLN
INTRAVENOUS | Status: DC | PRN
  Administered 2021-05-23: 200 mg via INTRAVENOUS

## 2021-05-23 SURGICAL SUPPLY — 41 items
APPLIER CLIP 5 13 M/L LIGAMAX5 (MISCELLANEOUS) ×2
BAG COUNTER SPONGE SURGICOUNT (BAG) ×2 IMPLANT
BLADE CLIPPER SURG (BLADE) IMPLANT
CANISTER SUCT 3000ML PPV (MISCELLANEOUS) ×2 IMPLANT
CHLORAPREP W/TINT 26 (MISCELLANEOUS) ×2 IMPLANT
CLIP APPLIE 5 13 M/L LIGAMAX5 (MISCELLANEOUS) ×1 IMPLANT
COVER SURGICAL LIGHT HANDLE (MISCELLANEOUS) ×2 IMPLANT
DERMABOND ADVANCED (GAUZE/BANDAGES/DRESSINGS) ×1
DERMABOND ADVANCED .7 DNX12 (GAUZE/BANDAGES/DRESSINGS) ×1 IMPLANT
ELECT REM PT RETURN 9FT ADLT (ELECTROSURGICAL) ×2
ELECTRODE REM PT RTRN 9FT ADLT (ELECTROSURGICAL) ×1 IMPLANT
GLOVE SURG POLY MICRO LF SZ5.5 (GLOVE) ×2 IMPLANT
GLOVE SURG UNDER POLY LF SZ6 (GLOVE) ×2 IMPLANT
GOWN STRL REUS W/ TWL LRG LVL3 (GOWN DISPOSABLE) ×3 IMPLANT
GOWN STRL REUS W/TWL LRG LVL3 (GOWN DISPOSABLE) ×6
KIT BASIN OR (CUSTOM PROCEDURE TRAY) ×2 IMPLANT
KIT TURNOVER KIT B (KITS) ×2 IMPLANT
L-HOOK LAP DISP 36CM (ELECTROSURGICAL) ×2
LHOOK LAP DISP 36CM (ELECTROSURGICAL) ×1 IMPLANT
NEEDLE INSUFFLATION 14GA 120MM (NEEDLE) IMPLANT
NS IRRIG 1000ML POUR BTL (IV SOLUTION) ×2 IMPLANT
PAD ARMBOARD 7.5X6 YLW CONV (MISCELLANEOUS) ×2 IMPLANT
PENCIL BUTTON HOLSTER BLD 10FT (ELECTRODE) ×2 IMPLANT
POUCH SPECIMEN RETRIEVAL 10MM (ENDOMECHANICALS) ×2 IMPLANT
SCISSORS LAP 5X35 DISP (ENDOMECHANICALS) ×2 IMPLANT
SET IRRIG TUBING LAPAROSCOPIC (IRRIGATION / IRRIGATOR) ×2 IMPLANT
SET TUBE SMOKE EVAC HIGH FLOW (TUBING) ×2 IMPLANT
SLEEVE ENDOPATH XCEL 5M (ENDOMECHANICALS) ×4 IMPLANT
SPECIMEN JAR SMALL (MISCELLANEOUS) ×2 IMPLANT
SUT MNCRL AB 4-0 PS2 18 (SUTURE) ×2 IMPLANT
SUT VIC AB 3-0 SH 27 (SUTURE)
SUT VIC AB 3-0 SH 27XBRD (SUTURE) IMPLANT
SUT VICRYL 0 UR6 27IN ABS (SUTURE) ×2 IMPLANT
TOWEL GREEN STERILE (TOWEL DISPOSABLE) ×2 IMPLANT
TOWEL GREEN STERILE FF (TOWEL DISPOSABLE) ×2 IMPLANT
TRAY LAPAROSCOPIC MC (CUSTOM PROCEDURE TRAY) ×2 IMPLANT
TROCAR XCEL 12X100 BLDLESS (ENDOMECHANICALS) IMPLANT
TROCAR XCEL BLUNT TIP 100MML (ENDOMECHANICALS) ×2 IMPLANT
TROCAR XCEL NON-BLD 5MMX100MML (ENDOMECHANICALS) ×2 IMPLANT
WARMER LAPAROSCOPE (MISCELLANEOUS) ×2 IMPLANT
WATER STERILE IRR 1000ML POUR (IV SOLUTION) ×2 IMPLANT

## 2021-05-23 NOTE — Transfer of Care (Signed)
Immediate Anesthesia Transfer of Care Note  Patient: Keith Cunningham  Procedure(s) Performed: LAPAROSCOPIC CHOLECYSTECTOMY (Abdomen)  Patient Location: PACU  Anesthesia Type:General  Level of Consciousness: drowsy  Airway & Oxygen Therapy: Patient Spontanous Breathing  Post-op Assessment: Report given to RN and Post -op Vital signs reviewed and stable  Post vital signs: Reviewed and stable  Last Vitals:  Vitals Value Taken Time  BP 152/90 05/23/21 1028  Temp    Pulse 77 05/23/21 1029  Resp 17 05/23/21 1029  SpO2 95 % 05/23/21 1029  Vitals shown include unvalidated device data.  Last Pain:  Vitals:   05/23/21 0738  TempSrc:   PainSc: 0-No pain      Patients Stated Pain Goal: 2 (13/88/71 9597)  Complications: No notable events documented.

## 2021-05-23 NOTE — Interval H&P Note (Signed)
History and Physical Interval Note:  05/23/2021 8:45 AM  Keith Cunningham  has presented today for surgery, with the diagnosis of CHOLELITHIASIS VERY SYMPTOMATIC.  The various methods of treatment have been discussed with the patient and family. After consideration of risks, benefits and other options for treatment, the patient has consented to  Procedure(s): LAPAROSCOPIC CHOLECYSTECTOMY (N/A) as a surgical intervention.  The patient's history has been reviewed, patient examined, no change in status, stable for surgery.  I have reviewed the patient's chart and labs.  Questions were answered to the patient's satisfaction.  Proceed to OR, plan for discharge home from PACU.   Dwan Bolt

## 2021-05-23 NOTE — Anesthesia Postprocedure Evaluation (Signed)
Anesthesia Post Note  Patient: Keith Cunningham  Procedure(s) Performed: LAPAROSCOPIC CHOLECYSTECTOMY (Abdomen)     Patient location during evaluation: PACU Anesthesia Type: General Level of consciousness: awake and alert Pain management: pain level controlled Vital Signs Assessment: post-procedure vital signs reviewed and stable Respiratory status: spontaneous breathing, nonlabored ventilation, respiratory function stable and patient connected to nasal cannula oxygen Cardiovascular status: blood pressure returned to baseline and stable Postop Assessment: no apparent nausea or vomiting Anesthetic complications: no   No notable events documented.  Last Vitals:  Vitals:   05/23/21 1044 05/23/21 1059  BP: (!) 142/74 (!) 143/89  Pulse: 82 73  Resp: (!) 22 13  Temp:  36.8 C  SpO2: 98% 95%    Last Pain:  Vitals:   05/23/21 1059  TempSrc:   PainSc: 0-No pain                 Murphy Duzan

## 2021-05-23 NOTE — Discharge Instructions (Addendum)
[facility] DISCHARGE INSTRUCTIONS  Activity No heavy lifting greater than 15 pounds for 4 weeks after surgery. Ok to shower in 24 hours, but do not bathe or submerge incisions underwater. Do not drive while taking narcotic pain medication.  Wound Care Your incisions are covered with skin glue called Dermabond. This will peel off on its own over time. You may shower and allow warm soapy water to run over your incisions. Gently pat dry. Do not submerge your incision underwater. Monitor your incision for any new redness, tenderness, or drainage.  When to Call us: Fever greater than 100.5 New redness, drainage, or swelling at incision site Severe pain, nausea, or vomiting Jaundice (yellowing of the whites of the eyes or skin)  Follow-up You have an appointment scheduled with Dr. Zenia Resides on June 11, 2021 at 9:30am. This will be at the [facility] Surgery office at [address], [place], [place], [place]. Please arrive at least 15 minutes prior to your scheduled appointment time.  For questions or concerns, please call the office at [phone].

## 2021-05-23 NOTE — Anesthesia Procedure Notes (Signed)
Procedure Name: Intubation Date/Time: 05/23/2021 9:22 AM Performed by: Janace Litten, CRNA Pre-anesthesia Checklist: Patient identified, Emergency Drugs available, Suction available and Patient being monitored Patient Re-evaluated:Patient Re-evaluated prior to induction Oxygen Delivery Method: Circle System Utilized Preoxygenation: Pre-oxygenation with 100% oxygen Induction Type: IV induction Ventilation: Mask ventilation without difficulty Laryngoscope Size: Glidescope and 4 Grade View: Grade I Tube type: Oral Tube size: 7.5 mm Number of attempts: 1 Airway Equipment and Method: Stylet and Oral airway Placement Confirmation: ETT inserted through vocal cords under direct vision, positive ETCO2 and breath sounds checked- equal and bilateral Secured at: 22 cm Tube secured with: Tape Dental Injury: Teeth and Oropharynx as per pre-operative assessment  Comments: Elective glidescope: patient presents with severe kyphosis and states "he passes out if he extends his neck significantly". Patient positioned with multiple support prior to induction and comfortable with positioning.

## 2021-05-23 NOTE — Op Note (Signed)
Date: 05/23/21  Patient: Keith Cunningham MRN: [id number]  Preoperative Diagnosis: Symptomatic cholelithiasis Postoperative Diagnosis: Same  Procedure: Laparoscopic cholecystectomy  Surgeon: Michaelle Birks, MD  EBL: Minimal  Anesthesia: General endotracheal  Specimens: Gallbladder  Indications: [name] is a 80 year old male who presented with severe right upper quadrant abdominal pain with radiation to the right shoulder.  Imaging work-up showed cholelithiasis without evidence of acute cholecystitis.  After an extensive discussion of the risks and benefits of surgery, he agreed to proceed with cholecystectomy.  Findings: Cholelithiasis with a stone lodged within the neck of the gallbladder at the cystic duct junction.  No evidence of acute cholecystitis.  Procedure details: Informed consent was obtained in the preoperative area prior to the procedure. The patient was brought to the operating room and placed on the table in the supine position.  General anesthesia was induced and appropriate lines and drains were placed for intraoperative monitoring. Perioperative antibiotics were administered per SCIP guidelines. The abdomen was prepped and draped in the usual sterile fashion. A pre-procedure timeout was taken verifying patient identity, surgical site and procedure to be performed.  A small infraumbilical skin incision was made, the subcutaneous tissue was divided with cautery, and the umbilical stalk was grasped and elevated. The fascia was incised and the peritoneal cavity was directly visualized. A 75mm Hassan trocar was placed. The peritoneal cavity was inspected with no evidence of visceral or vascular injury. Three 17mm ports were placed in the right subcostal margin, all under direct visualization. The fundus of the gallbladder was grasped and retracted cephalad. The infundibulum was retracted laterally. The cystic triangle was dissected out using cautery and blunt dissection, and  the critical view of safety was obtained.  The cystic artery was clipped and then divided with cautery.  There was a stone lodged within the proximal cystic duct, and this was milked back up into the gallbladder.  The cystic duct then clipped and ligated, leaving 2 clips on the cystic duct stump. The gallbladder was taken off the liver using cautery. The specimen was placed in an endocatch bag and removed. The surgical site was irrigated with saline until the effluent was clear. Hemostasis was achieved in the gallbladder fossa using cautery. The cystic duct and artery stumps were visually inspected and there was no evidence of bile leak or bleeding. The ports were removed under direct visualization. There was some bleeding from the subcutaneous tissue at the epigastric port site, and this was controlled with cautery. The abdomen was desufflated. The umbilical port site fascia was closed with a 0 vicryl suture. The skin at all port sites was closed with 4-0 monocryl subcuticular suture. Dermabond was applied.  The patient tolerated the procedure with no apparent complications.  All counts were correct x2 at the end of the procedure. The patient was extubated and taken to PACU in stable condition.  Michaelle Birks, MD 05/23/21 10:28 AM

## 2021-05-24 ENCOUNTER — Encounter (HOSPITAL_COMMUNITY): Payer: Self-pay | Admitting: Surgery

## 2021-05-24 LAB — SURGICAL PATHOLOGY

## 2021-07-01 DIAGNOSIS — L820 Inflamed seborrheic keratosis: Secondary | ICD-10-CM | POA: Diagnosis not present

## 2021-08-05 NOTE — Progress Notes (Signed)
Cardiology Office Note:    Date:  08/06/2021   ID:  Keith Cunningham, DOB [birth year], MRN [id number]  PCP:  Zola Button, MD  Cardiologist:  Sinclair Grooms, MD   Referring MD: Zola Button, MD   Chief Complaint  Patient presents with   Coronary Artery Disease   Hyperlipidemia    History of Present Illness:    Keith Cunningham is a 81 y.o. male with a hx of coronary artery disease with prior sequential SVG to OM1 and OM 2, LIMA to LAD, and SVG to PDA in 2013, hyperlipidemia, and statin intolerance.   No muscle aches or complications with low-dose statin therapy.  He has some dyspnea on exertion.  Stop walking to play golf and now rides.  He denies angina, orthopnea, PND, lower extremity swelling.  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    Staph infection 03/2017   had I and D of infected abscess     Past Surgical History:  Procedure Laterality Date   CARDIAC CATHETERIZATION  04/21/2012   CATARACT EXTRACTION, BILATERAL Bilateral    CHOLECYSTECTOMY N/A 05/23/2021   Procedure: LAPAROSCOPIC CHOLECYSTECTOMY;  Surgeon: Dwan Bolt, MD;  Location: [facility];  Service: General;  Laterality: N/A;   CORONARY ARTERY BYPASS GRAFT  05/10/2012   Procedure: CORONARY ARTERY BYPASS GRAFTING (CABG);  Surgeon: Grace Isaac, MD;  Location: [facility];  Service: Open Heart Surgery;   Laterality: N/A;  times three using Left Greater Saphenous Vein Graft harvested endoscopically and Left Internal Mammary Artery   EXCISIONAL HEMORRHOIDECTOMY  2012   "tried to shrink 1st w/needle; cut them out 2 wk later" (05/06/2012)   EYE SURGERY     INCISION AND DRAINAGE ABSCESS Right 03/25/2017   Procedure: INCISION AND DRAINAGE ABSCESS;  Surgeon: Jovita Kussmaul, MD;  Location: [facility];  Service: General;  Laterality: Right;   INTRAOPERATIVE TRANSESOPHAGEAL ECHOCARDIOGRAM  05/10/2012   Procedure: INTRAOPERATIVE TRANSESOPHAGEAL ECHOCARDIOGRAM;  Surgeon: Grace Isaac, MD;  Location: [facility];  Service: Open Heart Surgery;  Laterality: N/A;   MELANOMA EXCISION  2009   "left side of head" (05/06/2012)   TONSILLECTOMY AND ADENOIDECTOMY  ~ 1950   TOTAL HIP ARTHROPLASTY Left 05/26/2016   Procedure: TOTAL HIP ARTHROPLASTY ANTERIOR APPROACH;  Surgeon: Dorna Leitz, MD;  Location: [facility];  Service: Orthopedics;  Laterality: Left;   TOTAL HIP ARTHROPLASTY Right 06/25/2018   Procedure: TOTAL HIP ARTHROPLASTY ANTERIOR APPROACH;  Surgeon: Dorna Leitz, MD;  Location: [facility];  Service: Orthopedics;  Laterality: Right;    Current Medications: Current Meds  Medication Sig   acetaminophen (TYLENOL) 500 MG tablet Take 500 mg by mouth every 6 (six) hours as needed for moderate pain.   aspirin 81 MG EC tablet Take 1 tablet  by mouth daily.    Coenzyme Q10 (COQ10) 200 MG CAPS Take 200 mg by mouth daily.   HYDROcodone-acetaminophen (NORCO/VICODIN) 5-325 MG tablet Take 1 tablet by mouth every 6 (six) hours as needed for severe pain.   Omega-3 Fatty Acids (FISH OIL) 500 MG CAPS Take 500 mg by mouth in the morning and at bedtime.   [DISCONTINUED] rosuvastatin (CRESTOR) 5 MG tablet Take 1 tablet by mouth once daily     Allergies:   Atorvastatin, Doxycycline, Statins, and Codeine   Social History   Socioeconomic History   Marital status: Married    Spouse name: Not on file   Number of children: Not on file    Years of education: Not on file   Highest education level: Not on file  Occupational History   Occupation: retired    Comment: Press photographer  Tobacco Use   Smoking status: Never   Smokeless tobacco: Never  Vaping Use   Vaping Use: Never used  Substance and Sexual Activity   Alcohol use: No   Drug use: No   Sexual activity: Yes  Other Topics Concern   Not on file  Social History Narrative   Lives with wife   Caffeine use: Tea: 2 glasses per day (8 oz_   Coffee: every morning    Right handed    Social Determinants of Health   Financial Resource Strain: Not on file  Food Insecurity: Not on file  Transportation Needs: Not on file  Physical Activity: Not on file  Stress: Not on file  Social Connections: Not on file     Family History: The patient's family history includes Alcohol abuse in his sister; Diabetes in his sister; Diabetes Mellitus II in his mother; Heart attack in his father; Heart disease in his mother; Stroke in his mother.  ROS:   Please see the history of present illness.    Chronic low back discomfort.  Benign essential tremor.  All other systems reviewed and are negative.  EKGs/Labs/Other Studies Reviewed:    The following studies were reviewed today: No recent laboratory data especially lipids.  No echocardiography assessment of LV function.  EKG:  EKG sinus rhythm, left anterior hemiblock, incomplete right bundle, poor R wave progression, and when compared to June 22, 2020, today's heart rate is slightly faster.  Recent Labs: 05/02/2021: BUN 12; Creatinine, Ser 0.84; Potassium 4.1; Sodium 140 05/07/2021: ALT 22; Hemoglobin 15.7; Platelets 184  Recent Lipid Panel    Component Value Date/Time   CHOL 130 07/01/2019 0724   TRIG 80 07/01/2019 0724   HDL 63 07/01/2019 0724   CHOLHDL 2.1 07/01/2019 0724   CHOLHDL 3.7 04/04/2015 0908   VLDL 27 04/04/2015 0908   LDLCALC 51 07/01/2019 0724    Physical Exam:    VS:  BP 138/72    Pulse 88    Ht 5\' 6"   (1.676 m)    Wt 158 lb 12.8 oz (72 kg)    SpO2 96%    BMI 25.63 kg/m     Wt Readings from Last 3 Encounters:  08/06/21 158 lb 12.8 oz (72 kg)  05/23/21 150 lb (68 kg)  05/07/21 159 lb 9.6 oz (72.4 kg)     GEN: Appears younger than stated age. No acute distress HEENT: Normal NECK: No JVD. LYMPHATICS: No lymphadenopathy CARDIAC: No murmur. RRR no gallop, or edema. VASCULAR:  Normal Pulses. No bruits. RESPIRATORY:  Clear to auscultation without rales, wheezing or rhonchi  ABDOMEN: Soft, non-tender, non-distended, No pulsatile mass, MUSCULOSKELETAL:  No deformity  SKIN: Warm and dry NEUROLOGIC: Bilateral equivalent tremor of hands on intention.  Alert and oriented x 3 PSYCHIATRIC:  Normal affect   ASSESSMENT:    1. Atherosclerosis of native coronary artery of native heart with stable angina pectoris ([place])   2. Other hyperlipidemia   3. Benign essential tremor   4. Elevated hemoglobin A1c    PLAN:    In order of problems listed above:  Secondary prevention reviewed.  150 minutes of moderate activity encouraged. Continue low-dose rosuvastatin.  Liver and lipid panel will be done today.  He is fasting. No particular treatment We will recheck A1c today.  Overall education and awareness concerning secondary risk prevention was discussed in detail: LDL less than 70, hemoglobin A1c less than 7, blood pressure target less than 130/80 mmHg, >150 minutes of moderate aerobic activity per week, avoidance of smoking, weight control (via diet and exercise), and continued surveillance/management of/for obstructive sleep apnea.    Medication Adjustments/Labs and Tests Ordered: Current medicines are reviewed at length with the patient today.  Concerns regarding medicines are outlined above.  Orders Placed This Encounter  Procedures   EKG 12-Lead   Meds ordered this encounter  Medications   rosuvastatin (CRESTOR) 5 MG tablet    Sig: Take 1 tablet (5 mg total) by mouth daily.    Dispense:   90 tablet    Refill:  3    There are no Patient Instructions on file for this visit.   Signed, Sinclair Grooms, MD  08/06/2021 8:52 AM    [place]

## 2021-08-06 ENCOUNTER — Encounter: Payer: Self-pay | Admitting: Interventional Cardiology

## 2021-08-06 ENCOUNTER — Ambulatory Visit: Payer: Medicare Other | Admitting: Interventional Cardiology

## 2021-08-06 ENCOUNTER — Other Ambulatory Visit: Payer: Self-pay

## 2021-08-06 VITALS — BP 138/72 | HR 88 | Ht 66.0 in | Wt 158.8 lb

## 2021-08-06 DIAGNOSIS — I25118 Atherosclerotic heart disease of native coronary artery with other forms of angina pectoris: Secondary | ICD-10-CM

## 2021-08-06 DIAGNOSIS — G250 Essential tremor: Secondary | ICD-10-CM

## 2021-08-06 DIAGNOSIS — E7849 Other hyperlipidemia: Secondary | ICD-10-CM

## 2021-08-06 DIAGNOSIS — I5031 Acute diastolic (congestive) heart failure: Secondary | ICD-10-CM

## 2021-08-06 DIAGNOSIS — R7309 Other abnormal glucose: Secondary | ICD-10-CM | POA: Diagnosis not present

## 2021-08-06 LAB — HEMOGLOBIN A1C
Est. average glucose Bld gHb Est-mCnc: 117 mg/dL
Hgb A1c MFr Bld: 5.7 % — ABNORMAL HIGH (ref 4.8–5.6)

## 2021-08-06 LAB — LIPID PANEL
Chol/HDL Ratio: 2.5 ratio (ref 0.0–5.0)
Cholesterol, Total: 140 mg/dL (ref 100–199)
HDL: 55 mg/dL (ref >39)
LDL Chol Calc (NIH): 60 mg/dL (ref 0–99)
Triglycerides: 144 mg/dL (ref 0–149)
VLDL Cholesterol Cal: 25 mg/dL (ref 5–40)

## 2021-08-06 LAB — HEPATIC FUNCTION PANEL
ALT: 18 IU/L (ref 0–44)
AST: 28 IU/L (ref 0–40)
Albumin: 4.5 g/dL (ref 3.7–4.7)
Alkaline Phosphatase: 78 IU/L (ref 44–121)
Bilirubin Total: 0.6 mg/dL (ref 0.0–1.2)
Bilirubin, Direct: 0.19 mg/dL (ref 0.00–0.40)
Total Protein: 6.4 g/dL (ref 6.0–8.5)

## 2021-08-06 MED ORDER — ROSUVASTATIN CALCIUM 5 MG PO TABS
5.0000 mg | ORAL_TABLET | Freq: Every day | ORAL | 3 refills | Status: DC
Start: 2021-08-06 — End: 2022-08-04

## 2021-08-06 NOTE — Patient Instructions (Addendum)
Medication Instructions:  Your physician recommends that you continue on your current medications as directed. Please refer to the Current Medication list given to you today.  *If you need a refill on your cardiac medications before your next appointment, please call your pharmacy*   Lab Work: TODAY: lipids and liver panel, Hgb A1c If you have labs (blood work) drawn today and your tests are completely normal, you will receive your results only by: [place] (if you have MyChart) OR A paper copy in the mail If you have any lab test that is abnormal or we need to change your treatment, we will call you to review the results.   Testing/Procedures: Your physician has requested that you have an echocardiogram. Echocardiography is a painless test that uses sound waves to create images of your heart. It provides your doctor with information about the size and shape of your heart and how well your hearts chambers and valves are working. This procedure takes approximately one hour. There are no restrictions for this procedure.    Follow-Up: At [facility], you and your health needs are our priority.  As part of our continuing mission to provide you with exceptional heart care, we have created designated Provider Care Teams.  These Care Teams include your primary Cardiologist (physician) and Advanced Practice Providers (APPs -  Physician Assistants and Nurse Practitioners) who all work together to provide you with the care you need, when you need it.  We recommend signing up for the patient portal called "MyChart".  Sign up information is provided on this After Visit Summary.  MyChart is used to connect with patients for Virtual Visits (Telemedicine).  Patients are able to view lab/test results, encounter notes, upcoming appointments, etc.  Non-urgent messages can be sent to your provider as well.   To learn more about what you can do with MyChart, go to [email or web address].    Your next  appointment:   1 year(s)  The format for your next appointment:   In Person  Provider:   Sinclair Grooms, MD

## 2021-08-19 ENCOUNTER — Other Ambulatory Visit: Payer: Self-pay

## 2021-08-19 ENCOUNTER — Ambulatory Visit (HOSPITAL_COMMUNITY): Payer: Medicare Other | Attending: Internal Medicine

## 2021-08-19 DIAGNOSIS — I5031 Acute diastolic (congestive) heart failure: Secondary | ICD-10-CM | POA: Diagnosis not present

## 2021-08-19 LAB — ECHOCARDIOGRAM COMPLETE
Area-P 1/2: 1.89 cm2
S' Lateral: 1.7 cm

## 2021-08-19 MED ORDER — PERFLUTREN LIPID MICROSPHERE
1.0000 mL | INTRAVENOUS | Status: AC | PRN
  Administered 2021-08-19: 2 mL via INTRAVENOUS

## 2021-11-19 ENCOUNTER — Encounter: Payer: Self-pay | Admitting: *Deleted

## 2021-11-21 ENCOUNTER — Ambulatory Visit (INDEPENDENT_AMBULATORY_CARE_PROVIDER_SITE_OTHER): Payer: Medicare Other | Admitting: Family Medicine

## 2021-11-21 VITALS — BP 142/81 | HR 106 | Temp 98.8°F | Ht 66.0 in | Wt 158.6 lb

## 2021-11-21 DIAGNOSIS — Z20822 Contact with and (suspected) exposure to covid-19: Secondary | ICD-10-CM

## 2021-11-21 DIAGNOSIS — U071 COVID-19: Secondary | ICD-10-CM | POA: Diagnosis not present

## 2021-11-21 MED ORDER — NIRMATRELVIR/RITONAVIR (PAXLOVID)TABLET: ORAL_TABLET | ORAL | 0 refills | Status: DC

## 2021-11-21 NOTE — Progress Notes (Signed)
    SUBJECTIVE:   CHIEF COMPLAINT / HPI:   Viral URI symptoms: 81 year old male present with the above.  He states it started Saturday or Sunday after a trip to [place]. Sunday he started feeling like he was getting sick and started coughing up some phlegm. His spouse got similar symptoms. He noticed a tinge of blood with the phlegm. He has felt fatigued and some shortness of breath. No vomiting, no fever. He took a covid test at home that was positive but it was expired. He was riding in the bus for about 5 hours.  Denies calf pain.  He has lost his sense of taste. He is concerned he may have Covid.  His spouse is experiencing similar symptoms.  PERTINENT  PMH / PSH: None relevant  OBJECTIVE:   BP (!) 142/81   Pulse (!) 106   Temp 98.8 F (37.1 C)   Ht '5\' 6"'$  (1.676 m)   Wt 158 lb 9.6 oz (71.9 kg)   SpO2 97%   BMI 25.60 kg/m    Heart rate recheck 98  General: NAD, pleasant, able to participate in exam HEENT: No pharyngeal erythema, no obvious bleeding noted Cardiac: Borderline tachycardic with no murmurs. Respiratory: CTAB, normal effort, no crackles, no wheezes Extremities: No lower extremity edema Skin: warm and dry, no rashes noted Neuro: alert, no obvious focal deficits Psych: Normal affect and mood  ASSESSMENT/PLAN:   COVID-19: Patient notices symptoms started around Saturday or Sunday.  He has lost his sense of taste has had some shortness of breath and has had some viral respiratory symptoms such as runny nose and congestion.  He did take COVID test that was expired and it was positive.  Discussed with him that this should be taken as a positive especially given his other symptoms.  He has felt some shortness of breath but has normal O2 sats at this time and him and his spouse do have a way of monitoring that at home.  He was mildly tachycardic upon entering the room but after getting situated his heart rate improved to below 100.  Due to his medical history I discussed that it  does seem reasonable to start Paxlovid.  The only medication that he takes that would be contraindicated is the Crestor.  He is going to hold the Crestor for 5 days while takes Paxlovid.  His creatinine is fine per last labs.  Discussed strict return/ED precautions should he develop any shortness of breath or other concerning symptoms.  Discussed with his spouse since she also has symptoms that she should be tested and they should isolate per [facility] recommendations.   Lurline Del, [place]

## 2021-11-21 NOTE — Patient Instructions (Signed)
We are starting medication today for COVID called Paxlovid.  You will take this for the next 5 days.  You must not take your Crestor while you are taking this medicine.  If you develop any trouble breathing, chest pain, or other concerning symptoms you should immediately go to the emergency department.

## 2021-12-09 DIAGNOSIS — L570 Actinic keratosis: Secondary | ICD-10-CM | POA: Diagnosis not present

## 2021-12-09 DIAGNOSIS — Z85820 Personal history of malignant melanoma of skin: Secondary | ICD-10-CM | POA: Diagnosis not present

## 2021-12-09 DIAGNOSIS — L738 Other specified follicular disorders: Secondary | ICD-10-CM | POA: Diagnosis not present

## 2021-12-09 DIAGNOSIS — L723 Sebaceous cyst: Secondary | ICD-10-CM | POA: Diagnosis not present

## 2021-12-09 DIAGNOSIS — D1801 Hemangioma of skin and subcutaneous tissue: Secondary | ICD-10-CM | POA: Diagnosis not present

## 2021-12-09 DIAGNOSIS — L821 Other seborrheic keratosis: Secondary | ICD-10-CM | POA: Diagnosis not present

## 2021-12-27 DIAGNOSIS — H31003 Unspecified chorioretinal scars, bilateral: Secondary | ICD-10-CM | POA: Diagnosis not present

## 2021-12-27 DIAGNOSIS — H16223 Keratoconjunctivitis sicca, not specified as Sjogren's, bilateral: Secondary | ICD-10-CM | POA: Diagnosis not present

## 2021-12-27 DIAGNOSIS — H52202 Unspecified astigmatism, left eye: Secondary | ICD-10-CM | POA: Diagnosis not present

## 2021-12-27 DIAGNOSIS — H353121 Nonexudative age-related macular degeneration, left eye, early dry stage: Secondary | ICD-10-CM | POA: Diagnosis not present

## 2022-01-24 DIAGNOSIS — H31001 Unspecified chorioretinal scars, right eye: Secondary | ICD-10-CM | POA: Diagnosis not present

## 2022-01-24 DIAGNOSIS — H353121 Nonexudative age-related macular degeneration, left eye, early dry stage: Secondary | ICD-10-CM | POA: Diagnosis not present

## 2022-08-04 ENCOUNTER — Other Ambulatory Visit: Payer: Self-pay

## 2022-08-04 MED ORDER — ROSUVASTATIN CALCIUM 5 MG PO TABS: 5.0000 mg | ORAL_TABLET | Freq: Every day | ORAL | 0 refills | Status: DC

## 2022-09-18 ENCOUNTER — Ambulatory Visit: Payer: Medicare Other | Attending: Cardiology | Admitting: Cardiology

## 2022-09-18 ENCOUNTER — Encounter: Payer: Self-pay | Admitting: Cardiology

## 2022-09-18 VITALS — BP 132/74 | HR 72 | Ht 66.0 in | Wt 160.0 lb

## 2022-09-18 DIAGNOSIS — E7849 Other hyperlipidemia: Secondary | ICD-10-CM | POA: Diagnosis not present

## 2022-09-18 DIAGNOSIS — Z951 Presence of aortocoronary bypass graft: Secondary | ICD-10-CM

## 2022-09-18 DIAGNOSIS — I25118 Atherosclerotic heart disease of native coronary artery with other forms of angina pectoris: Secondary | ICD-10-CM

## 2022-09-18 NOTE — Progress Notes (Signed)
Cardiology Office Note:    Date:  09/18/2022   ID:  Keith Cunningham, DOB 02-11-1941, MRN [id number]  PCP:  Keith Button, MD   [facility] Cardiologist:  Keith Furbish, MD     Referring MD: Keith Button, MD    History of Present Illness:    Keith Cunningham is a 82 y.o. male coronary artery disease with prior sequential SVG to OM1 and OM 2, LIMA to LAD, and SVG to PDA in 2013 with NSTEMI, hyperlipidemia, and statin intolerance. Former patient of Dr. Daneen Cunningham.   Symptoms of shortness of breath and pressure, vomiting with prior MI.   Gall bladder is removed.  Occasionally will have indigestion.  Recommended Pepcid as needed.    No muscle aches or complications with low-dose statin therapy.   He has some dyspnea on exertion.  Stop walking to play golf and now rides.  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  He also worked in the [employer].  He raced cars for about 10 years, worked at [employer] and retired at 12.  His wife Keith Cunningham is here with him.  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    Staph infection 03/2017   had I and D of infected abscess     Past Surgical History:  Procedure Laterality Date   CARDIAC CATHETERIZATION  04/21/2012   CATARACT EXTRACTION, BILATERAL Bilateral    CHOLECYSTECTOMY N/A 05/23/2021   Procedure: LAPAROSCOPIC  CHOLECYSTECTOMY;  Surgeon: Keith Bolt, MD;  Location: [facility];  Service: General;  Laterality: N/A;   CORONARY ARTERY BYPASS GRAFT  05/10/2012   Procedure: CORONARY ARTERY BYPASS GRAFTING (CABG);  Surgeon: Keith Isaac, MD;  Location: [place];  Service: Open Heart Surgery;  Laterality: N/A;  times three using Left Greater Saphenous Vein Graft harvested endoscopically and Left Internal Mammary Artery   EXCISIONAL HEMORRHOIDECTOMY  2012   "tried to shrink 1st w/needle; cut them out 2 wk later" (05/06/2012)   EYE SURGERY     INCISION AND DRAINAGE ABSCESS Right 03/25/2017   Procedure: INCISION AND DRAINAGE ABSCESS;  Surgeon: Keith Kussmaul, MD;  Location: [facility];  Service: General;  Laterality: Right;   INTRAOPERATIVE TRANSESOPHAGEAL ECHOCARDIOGRAM  05/10/2012   Procedure: INTRAOPERATIVE TRANSESOPHAGEAL ECHOCARDIOGRAM;  Surgeon: Keith Isaac, MD;  Location: [facility];  Service: Open Heart Surgery;  Laterality: N/A;   MELANOMA EXCISION  2009   "left side of head" (05/06/2012)   TONSILLECTOMY AND ADENOIDECTOMY  ~ 1950   TOTAL HIP ARTHROPLASTY Left 05/26/2016   Procedure: TOTAL HIP ARTHROPLASTY ANTERIOR APPROACH;  Surgeon: Keith Leitz, MD;  Location: [facility];  Service: Orthopedics;  Laterality: Left;   TOTAL HIP ARTHROPLASTY Right 06/25/2018   Procedure: TOTAL HIP ARTHROPLASTY ANTERIOR  APPROACH;  Surgeon: Keith Leitz, MD;  Location: [facility];  Service: Orthopedics;  Laterality: Right;    Current Medications: Current Meds  Medication Sig   acetaminophen (TYLENOL) 500 MG tablet Take 500 mg by mouth every 6 (six) hours as needed for moderate pain.   aspirin 81 MG EC tablet Take 1 tablet by mouth daily.    Coenzyme Q10 (COQ10) 200 MG CAPS Take 200 mg by mouth daily.   Omega-3 Fatty Acids (FISH OIL) 500 MG CAPS Take 500 mg by mouth in the morning and at bedtime.   rosuvastatin (CRESTOR) 5 MG tablet Take 1 tablet (5 mg total) by mouth daily.     Allergies:   Atorvastatin, Doxycycline, Statins,  and Codeine   Social History   Socioeconomic History   Marital status: Married    Spouse name: Not on file   Number of children: Not on file   Years of education: Not on file   Highest education level: Not on file  Occupational History   Occupation: retired    Comment: Press photographer  Tobacco Use   Smoking status: Never   Smokeless tobacco: Never  Vaping Use   Vaping Use: Never used  Substance and Sexual Activity   Alcohol use: No   Drug use: No   Sexual activity: Yes  Other Topics Concern   Not on file  Social History Narrative   Lives with wife   Caffeine use: Tea: 2 glasses per day (8 oz_   Coffee: every morning    Right handed    Social Determinants of Health   Financial Resource Strain: Not on file  Food Insecurity: Not on file  Transportation Needs: Not on file  Physical Activity: Not on file  Stress: Not on file  Social Connections: Not on file     Family History: The patient's family history includes Alcohol abuse in his sister; Diabetes in his sister; Diabetes Mellitus II in his mother; Heart attack in his father; Heart disease in his mother; Stroke in his mother.  ROS:   Please see the history of present illness.     All other systems reviewed and are negative.  EKGs/Labs/Other Studies Reviewed:    The following studies were reviewed today: Cardiac Studies & Procedures       ECHOCARDIOGRAM  ECHOCARDIOGRAM COMPLETE 08/19/2021  Narrative ECHOCARDIOGRAM REPORT    Patient Name:   Keith Cunningham Date of Exam: 08/19/2021 Medical Rec #:  [id number]         Height:       66.0 in Accession #:    [id number]        Weight:       158.8 lb Date of Birth:  27-Aug-1940         BSA:          1.813 m Patient Age:    59 years          BP:           138/72 mmHg Patient Gender: M                 HR:           77 bpm. Exam Location:  [address]  Procedure: 2D Echo, Cardiac Doppler, Color Doppler and Intracardiac Opacification Agent  Indications:    [id number] Acute  diastolic (congestive) heart failure  History:        Patient has no prior history of Echocardiogram examinations. Previous Myocardial Infarction and CAD, Prior CABG, Signs/Symptoms:Chest Pain;  Risk Factors:Dyslipidemia. Post pericardiotomy pericarditis. Vertigo. Anemia. Abdominal pain.  Sonographer:    Diamond Nickel RCS Referring Phys: [place]   1. Left ventricular ejection fraction, by estimation, is 65 to 70%. The left ventricle has normal function. The left ventricle has no regional wall motion abnormalities. There is mild left ventricular hypertrophy. Left ventricular diastolic parameters were normal. 2. Right ventricular systolic function is normal. The right ventricular size is mildly enlarged. 3. The mitral valve is normal in structure. Mild mitral valve regurgitation. 4. The aortic valve is tricuspid. Aortic valve regurgitation is mild.  FINDINGS Left Ventricle: Left ventricular ejection fraction, by estimation, is 65 to 70%. The left ventricle has normal function. The left ventricle has no regional wall motion abnormalities. The left ventricular internal cavity size was small. There is mild left ventricular hypertrophy. Left ventricular diastolic parameters were normal.  Right Ventricle: The right ventricular size is mildly enlarged. Right vetricular wall thickness was not assessed. Right ventricular systolic function is normal.  Left Atrium: Left atrial size was normal in size.  Right Atrium: Right atrial size was normal in size.  Pericardium: There is no evidence of pericardial effusion.  Mitral Valve: The mitral valve is normal in structure. Mild mitral valve regurgitation.  Tricuspid Valve: The tricuspid valve is normal in structure. Tricuspid valve regurgitation is trivial.  Aortic Valve: The aortic valve is tricuspid. Aortic valve regurgitation is mild.  Pulmonic Valve: The pulmonic valve was normal in structure. Pulmonic valve regurgitation is  mild.  Aorta: The aortic root and ascending aorta are structurally normal, with no evidence of dilitation.  IAS/Shunts: The interatrial septum was not assessed.   LEFT VENTRICLE PLAX 2D LVIDd:         2.90 cm   Diastology LVIDs:         1.70 cm   LV e' medial:    7.07 cm/s LV PW:         1.00 cm   LV E/e' medial:  9.0 LV IVS:        1.40 cm   LV e' lateral:   8.16 cm/s LVOT diam:     1.95 cm   LV E/e' lateral: 7.8 LV SV:         64 LV SV Index:   35 LVOT Area:     2.99 cm   RIGHT VENTRICLE RV S prime:     10.20 cm/s TAPSE (M-mode): 0.7 cm  LEFT ATRIUM             Index LA diam:        3.00 cm 1.65 cm/m LA Vol (A2C):   22.5 ml 12.41 ml/m LA Vol (A4C):   33.9 ml 18.70 ml/m LA Biplane Vol: 28.7 ml 15.83 ml/m AORTIC VALVE LVOT Vmax:   95.00 cm/s LVOT Vmean:  55.700 cm/s LVOT VTI:    0.213 m  AORTA Ao Root diam: 2.80 cm Ao Asc diam:  3.10 cm  MITRAL VALVE MV Area (PHT): 1.89 cm    SHUNTS MV Decel Time: 401 msec    Systemic VTI:  0.21 m MV E velocity: 63.40 cm/s  Systemic Diam: 1.95 cm MV A velocity: 76.50 cm/s MV E/A ratio:  0.83  Dorris Carnes MD Electronically signed by Dorris Carnes MD Signature Date/Time: 08/19/2021/2:11:26 PM    Final              EKG:/4/24-normal rhythm 72 left anterior fascicular block poor R wave progression, prior left anterior hemiblock, incomplete right  bundle, poor R wave progression   Recent Labs: No results found for requested labs within last 365 days.  Recent Lipid Panel    Component Value Date/Time   CHOL 140 08/06/2021 0918   TRIG 144 08/06/2021 0918   HDL 55 08/06/2021 0918   CHOLHDL 2.5 08/06/2021 0918   CHOLHDL 3.7 04/04/2015 0908   VLDL 27 04/04/2015 0908   LDLCALC 60 08/06/2021 0918     Risk Assessment/Calculations:               Physical Exam:    VS:  BP 132/74   Pulse 72   Ht 5\' 6"  (1.676 m)   Wt 160 lb (72.6 kg)   SpO2 97%   BMI 25.82 kg/m     Wt Readings from Last 3 Encounters:  09/18/22 160  lb (72.6 kg)  11/21/21 158 lb 9.6 oz (71.9 kg)  08/06/21 158 lb 12.8 oz (72 kg)     GEN:  Well nourished, well developed in no acute distress HEENT: Normal NECK: No JVD; No carotid bruits LYMPHATICS: No lymphadenopathy CARDIAC: RRR, no murmurs, rubs, gallops RESPIRATORY:  Clear to auscultation without rales, wheezing or rhonchi  ABDOMEN: Soft, non-tender, non-distended MUSCULOSKELETAL:  No edema; No deformity  SKIN: Warm and dry NEUROLOGIC:  Alert and oriented x 3 PSYCHIATRIC:  Normal affect   ASSESSMENT:    1. Atherosclerosis of native coronary artery of native heart with stable angina pectoris   2. Other hyperlipidemia    PLAN:    In order of problems listed above:  Coronary artery disease prior bypass - Continue with secondary prevention efforts.  Doing well.  150 minutes of moderate exercise encouraged.  No anginal symptoms.  Hyperlipidemia - On low-dose rosuvastatin.  Had statin intolerance in the past.  Benign tremor - No treatment at this point.  GERD - Pepcid as needed.  Hemoglobin A1c 5.7.  Checking lab work today.  1 year follow-up.         Medication Adjustments/Labs and Tests Ordered: Current medicines are reviewed at length with the patient today.  Concerns regarding medicines are outlined above.  Orders Placed This Encounter  Procedures   Lipid panel   CBC   Comprehensive metabolic panel   EKG [id number]   No orders of the defined types were placed in this encounter.   Patient Instructions  Medication Instructions:  No changes *If you need a refill on your cardiac medications before your next appointment, please call your pharmacy*   Lab Work: Today: lipds/cmet/cbc  If you have labs (blood work) drawn today and your tests are completely normal, you will receive your results only by: [place] (if you have MyChart) OR A paper copy in the mail If you have any lab test that is abnormal or we need to change your treatment, we will  call you to review the results.   Testing/Procedures: none   Follow-Up: At [facility], you and your health needs are our priority.  As part of our continuing mission to provide you with exceptional heart care, we have created designated Provider Care Teams.  These Care Teams include your primary Cardiologist (physician) and Advanced Practice Providers (APPs -  Physician Assistants and Nurse Practitioners) who all work together to provide you with the care you need, when you need it.    Your next appointment:   12 month(s)  Provider:   Candee Furbish, MD        Signed, Keith Furbish, MD  09/18/2022 10:45 AM  [place]

## 2022-09-18 NOTE — Patient Instructions (Signed)
Medication Instructions:  No changes *If you need a refill on your cardiac medications before your next appointment, please call your pharmacy*   Lab Work: Today: lipds/cmet/cbc  If you have labs (blood work) drawn today and your tests are completely normal, you will receive your results only by: [place] (if you have MyChart) OR A paper copy in the mail If you have any lab test that is abnormal or we need to change your treatment, we will call you to review the results.   Testing/Procedures: none   Follow-Up: At [facility], you and your health needs are our priority.  As part of our continuing mission to provide you with exceptional heart care, we have created designated Provider Care Teams.  These Care Teams include your primary Cardiologist (physician) and Advanced Practice Providers (APPs -  Physician Assistants and Nurse Practitioners) who all work together to provide you with the care you need, when you need it.    Your next appointment:   12 month(s)  Provider:   Candee Furbish, MD

## 2022-09-19 LAB — COMPREHENSIVE METABOLIC PANEL
ALT: 21 IU/L (ref 0–44)
AST: 28 IU/L (ref 0–40)
Albumin/Globulin Ratio: 2 (ref 1.2–2.2)
Albumin: 4.4 g/dL (ref 3.8–4.8)
Alkaline Phosphatase: 72 IU/L (ref 44–121)
BUN/Creatinine Ratio: 14 (ref 10–24)
BUN: 13 mg/dL (ref 8–27)
Bilirubin Total: 0.5 mg/dL (ref 0.0–1.2)
CO2: 25 mmol/L (ref 20–29)
Calcium: 9.7 mg/dL (ref 8.6–10.2)
Chloride: 103 mmol/L (ref 96–106)
Creatinine, Ser: 0.91 mg/dL (ref 0.76–1.27)
Globulin, Total: 2.2 g/dL (ref 1.5–4.5)
Glucose: 105 mg/dL — ABNORMAL HIGH (ref 70–99)
Potassium: 4.8 mmol/L (ref 3.5–5.2)
Sodium: 143 mmol/L (ref 134–144)
Total Protein: 6.6 g/dL (ref 6.0–8.5)
eGFR: 85 mL/min/{1.73_m2} (ref >59)

## 2022-09-19 LAB — CBC
Hematocrit: 49.8 % (ref 37.5–51.0)
Hemoglobin: 16.7 g/dL (ref 13.0–17.7)
MCH: 31.7 pg (ref 26.6–33.0)
MCHC: 33.5 g/dL (ref 31.5–35.7)
MCV: 95 fL (ref 79–97)
Platelets: 195 10*3/uL (ref 150–450)
RBC: 5.27 x10E6/uL (ref 4.14–5.80)
RDW: 12.5 % (ref 11.6–15.4)
WBC: 6.5 10*3/uL (ref 3.4–10.8)

## 2022-09-19 LAB — LIPID PANEL
Chol/HDL Ratio: 2.6 ratio (ref 0.0–5.0)
Cholesterol, Total: 147 mg/dL (ref 100–199)
HDL: 57 mg/dL (ref >39)
LDL Chol Calc (NIH): 65 mg/dL (ref 0–99)
Triglycerides: 148 mg/dL (ref 0–149)
VLDL Cholesterol Cal: 25 mg/dL (ref 5–40)

## 2022-10-28 ENCOUNTER — Other Ambulatory Visit: Payer: Self-pay | Admitting: Cardiology

## 2022-12-10 DIAGNOSIS — D1801 Hemangioma of skin and subcutaneous tissue: Secondary | ICD-10-CM | POA: Diagnosis not present

## 2022-12-10 DIAGNOSIS — L821 Other seborrheic keratosis: Secondary | ICD-10-CM | POA: Diagnosis not present

## 2022-12-10 DIAGNOSIS — Z85820 Personal history of malignant melanoma of skin: Secondary | ICD-10-CM | POA: Diagnosis not present

## 2022-12-10 DIAGNOSIS — D2262 Melanocytic nevi of left upper limb, including shoulder: Secondary | ICD-10-CM | POA: Diagnosis not present

## 2022-12-10 DIAGNOSIS — L812 Freckles: Secondary | ICD-10-CM | POA: Diagnosis not present

## 2022-12-10 DIAGNOSIS — D225 Melanocytic nevi of trunk: Secondary | ICD-10-CM | POA: Diagnosis not present

## 2022-12-16 ENCOUNTER — Ambulatory Visit (INDEPENDENT_AMBULATORY_CARE_PROVIDER_SITE_OTHER): Payer: Medicare Other | Admitting: *Deleted

## 2022-12-16 DIAGNOSIS — Z0000 Encounter for general adult medical examination without abnormal findings: Secondary | ICD-10-CM

## 2022-12-16 NOTE — Progress Notes (Signed)
Subjective:   Keith Cunningham is a 82 y.o. male who presents for an Initial Medicare Annual Wellness Visit.  Visit Complete: Virtual  I connected with  Keith Cunningham on 12/16/22 by a audio enabled telemedicine application and verified that I am speaking with the correct person using two identifiers.  Patient Location: Home  Provider Location: Home Office  I discussed the limitations of evaluation and management by telemedicine. The patient expressed understanding and agreed to proceed.  Patient Medicare AWV questionnaire was completed by the patient on 12-12-2022; I have confirmed that all information answered by patient is correct and no changes since this date.  Review of Systems     Cardiac Risk Factors include: advanced age (>53men, >74 women);family history of premature cardiovascular disease;male gender     Objective:    Today's Vitals   There is no height or weight on file to calculate BMI.     12/16/2022    9:36 AM 11/21/2021    9:11 AM 05/07/2021   11:00 AM 07/23/2020    9:12 AM 06/25/2018    6:00 PM 03/25/2017    4:08 PM 05/26/2016   12:09 PM  Advanced Directives  Does Patient Have a Medical Advance Directive? Yes No No Yes No Yes   Type of Clinical research associate of [place];Living will;Out of facility DNR (pink MOST or yellow form)  Healthcare Power of [place];Living will   Does patient want to make changes to medical advance directive?    No - Patient declined  No - Patient declined   Copy of Healthcare Power of Attorney in Chart? Yes - validated most recent copy scanned in chart (See row information)   Yes - validated most recent copy scanned in chart (See row information)  No - copy requested Yes  Would patient like information on creating a medical advance directive?  No - Patient declined No - Patient declined  No - Patient declined      Current Medications (verified) Outpatient Encounter Medications as of  12/16/2022  Medication Sig   acetaminophen (TYLENOL) 500 MG tablet Take 500 mg by mouth every 6 (six) hours as needed for moderate pain.   aspirin 81 MG EC tablet Take 1 tablet by mouth daily.    Coenzyme Q10 (COQ10) 200 MG CAPS Take 200 mg by mouth daily.   Omega-3 Fatty Acids (FISH OIL) 500 MG CAPS Take 500 mg by mouth in the morning and at bedtime.   rosuvastatin (CRESTOR) 5 MG tablet Take 1 tablet by mouth once daily   No facility-administered encounter medications on file as of 12/16/2022.    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  Staph infection 03/2017   had I and D of infected abscess    Past Surgical History:  Procedure Laterality Date   CARDIAC CATHETERIZATION  04/21/2012   CATARACT EXTRACTION, BILATERAL Bilateral    CHOLECYSTECTOMY N/A 05/23/2021   Procedure: LAPAROSCOPIC CHOLECYSTECTOMY;  Surgeon: Fritzi Mandes, MD;  Location: [facility] OR;  Service: General;  Laterality: N/A;   CORONARY ARTERY BYPASS GRAFT  05/10/2012   Procedure: CORONARY ARTERY BYPASS GRAFTING (CABG);  Surgeon: Delight Ovens, MD;  Location: [facility] OR;  Service: Open Heart Surgery;  Laterality: N/A;  times three using Left Greater Saphenous Vein Graft harvested endoscopically and Left Internal Mammary Artery   EXCISIONAL  HEMORRHOIDECTOMY  2012   "tried to shrink 1st w/needle; cut them out 2 wk later" (05/06/2012)   EYE SURGERY     INCISION AND DRAINAGE ABSCESS Right 03/25/2017   Procedure: INCISION AND DRAINAGE ABSCESS;  Surgeon: Griselda Miner, MD;  Location: [facility] ORS;  Service: General;  Laterality: Right;   INTRAOPERATIVE TRANSESOPHAGEAL ECHOCARDIOGRAM  05/10/2012   Procedure: INTRAOPERATIVE TRANSESOPHAGEAL ECHOCARDIOGRAM;  Surgeon: Delight Ovens, MD;  Location: [facility] OR;  Service: Open Heart Surgery;  Laterality: N/A;   MELANOMA EXCISION  2009   "left side of head" (05/06/2012)   TONSILLECTOMY AND ADENOIDECTOMY  ~ 1950   TOTAL HIP ARTHROPLASTY Left 05/26/2016   Procedure: TOTAL HIP ARTHROPLASTY ANTERIOR APPROACH;  Surgeon: Jodi Geralds, MD;  Location: [facility] OR;  Service: Orthopedics;  Laterality: Left;   TOTAL HIP ARTHROPLASTY Right 06/25/2018   Procedure: TOTAL HIP ARTHROPLASTY ANTERIOR APPROACH;  Surgeon: Jodi Geralds, MD;  Location: [facility] ORS;  Service: Orthopedics;  Laterality: Right;   Family History  Problem Relation Age of Onset   Diabetes Mellitus II Mother    Heart disease Mother    Stroke Mother    Heart attack Father    Diabetes Sister    Alcohol abuse Sister    Social History   Socioeconomic History   Marital status: Married    Spouse name: Not on file   Number of children: Not on file   Years of education: Not on file   Highest education level: Not on file  Occupational History   Occupation: retired    Comment: Airline pilot  Tobacco Use   Smoking status: Never   Smokeless tobacco: Never  Vaping Use   Vaping Use: Never used  Substance and Sexual Activity   Alcohol use: No   Drug use: No   Sexual activity: Yes  Other Topics Concern   Not on file  Social History Narrative   Lives with wife   Caffeine use: Tea: 2 glasses per day (8 oz_   Coffee: every morning    Right handed    Social Determinants of Health   Financial Resource Strain: Low Risk  (12/16/2022)   Overall Financial  Resource Strain (CARDIA)    Difficulty of Paying Living Expenses: Not hard at all  Food Insecurity: No Food Insecurity (12/16/2022)   Hunger Vital Sign    Worried About Running Out of Food in the Last Year: Never true    Ran Out of Food in the Last Year: Never true  Transportation Needs: No Transportation Needs (12/16/2022)   PRAPARE - Administrator, Civil Service (Medical): No    Lack of Transportation (Non-Medical): No  Physical Activity: Insufficiently Active (12/16/2022)   Exercise Vital Sign    Days of Exercise per Week: 3 days    Minutes of Exercise per Session: 30 min  Stress: No Stress Concern Present (12/16/2022)   [employer] of Occupational Health - Occupational Stress Questionnaire    Feeling of Stress : Not at all  Social Connections: Moderately Isolated (12/16/2022)   Social Connection and Isolation Panel [NHANES]    Frequency of Communication with Friends and Family: Twice a week    Frequency of Social Gatherings with Friends and Family: Twice a week    Attends Religious Services: Never    Diplomatic Services operational officer: No    Attends Engineer, structural: Never    Marital Status: Married    Tobacco Counseling Counseling given: Not Answered   Clinical Intake:  Pre-visit preparation completed: Yes  Pain : No/denies pain     Diabetes: No  How often do you need to have someone help you when you read instructions, pamphlets, or other written materials from your doctor or pharmacy?: 1 - Never  Interpreter Needed?: No  Information entered by :: Remi Haggard LPN   Activities of Daily Living    12/16/2022    9:42 AM 12/16/2022    9:41 AM  In your present state of health, do you have any difficulty performing the following activities:  Hearing? 1 1  Vision? 1 0  Difficulty concentrating or making decisions? 0 0  Walking or climbing stairs? 0 0  Dressing or bathing? 0 0  Doing errands, shopping? 0 0  Preparing Food and eating ? N  N  Using the Toilet? N N  In the past six months, have you accidently leaked urine? N N  Do you have problems with loss of bowel control? N N  Managing your Medications? N N  Managing your Finances? N N  Housekeeping or managing your Housekeeping? N N    Patient Care Team: Tiffany Kocher, DO as PCP - General (Family Medicine) Jake Bathe, MD as PCP - Cardiology (Cardiology)  Indicate any recent Medical Services you may have received from other than [facility] providers in the past year (date may be approximate).     Assessment:   This is a routine wellness examination for [place].  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  Does not wear hearing aids Vision Screening - Comments:: Up to date [place]  Dietary issues and exercise activities discussed:     Goals Addressed             This Visit's Progress    Patient Stated       Continue current lifestyle       Depression Screen    12/16/2022    9:43 AM 11/21/2021    9:14 AM 05/07/2021   11:00 AM 05/02/2021   10:52 AM 09/10/2020   10:15 AM 09/03/2020   10:14 AM 07/23/2020    9:12 AM  PHQ 2/9 Scores  PHQ - 2 Score 0 1 0 0 0 0 0  PHQ- 9 Score 0 6 0 0 0 0 0    Fall Risk    12/16/2022    9:36 AM 12/12/2022    8:25 PM 11/21/2021    9:10 AM 05/07/2021   11:00 AM 05/02/2021   10:52 AM  Fall Risk   Falls in the past year? 0 0 0 0 0  Number falls in past yr: 0  0 0 0  Injury with Fall? 0  0 0 0  Follow up Falls evaluation completed;Education provided;Falls prevention discussed        MEDICARE  RISK AT HOME:  Medicare Risk at Home - 12/16/22 0936     Any stairs in or around the home? No    If so, are there any without handrails? No    Home free of loose throw rugs in walkways, pet beds, electrical cords, etc? Yes    Adequate lighting in your home to reduce risk of falls? Yes    Life alert? No    Use of a cane, walker or w/c? No    Grab bars in the bathroom? No     Shower chair or bench in shower? No    Elevated toilet seat or a handicapped toilet? No             TIMED UP AND GO:  Was the test performed? No    Cognitive Function:        12/16/2022    9:40 AM  6CIT Screen  What Year? 0 points  What month? 0 points  What time? 0 points  Count back from 20 0 points  Months in reverse 2 points  Repeat phrase 0 points  Total Score 2 points    Immunizations Immunization History  Administered Date(s) Administered   Moderna Sars-Covid-2 Vaccination 01/19/2020, 07/18/2020, 11/16/2020   PFIZER Comirnaty(Gray Top)Covid-19 Tri-Sucrose Vaccine 03/13/2021   Tdap 09/10/2020    TDAP status: Up to date  Flu Vaccine status: Up to date  Pneumococcal vaccine status: Declined,  Education has been provided regarding the importance of this vaccine but patient still declined. Advised may receive this vaccine at local pharmacy or Health Dept. Aware to provide a copy of the vaccination record if obtained from local pharmacy or Health Dept. Verbalized acceptance and understanding.   Covid-19 vaccine status: Information provided on how to obtain vaccines.   Qualifies for Shingles Vaccine? Yes   Zostavax completed No   Shingrix Completed?: No.    Education has been provided regarding the importance of this vaccine. Patient has been advised to call insurance company to determine out of pocket expense if they have not yet received this vaccine. Advised may also receive vaccine at local pharmacy or Health Dept. Verbalized acceptance and understanding.  Screening Tests Health Maintenance  Topic Date Due   COVID-19 Vaccine (5 - 2023-24 season) 01/01/2023 (Originally 02/14/2022)   Zoster Vaccines- Shingrix (1 of 2) 03/18/2023 (Originally 03/03/1992)   Pneumonia Vaccine 64+ Years old (1 of 1 - PCV) 12/16/2023 (Originally 03/04/2007)   INFLUENZA VACCINE  01/15/2023   Medicare Annual Wellness (AWV)  12/16/2023   DTaP/Tdap/Td (2 - Td or Tdap) 09/11/2030   HPV  VACCINES  Aged Out   Hepatitis C Screening  Discontinued    Health Maintenance  There are no preventive care reminders to display for this patient.   Colorectal cancer screening: No longer required.   Lung Cancer Screening: (Low Dose CT Chest recommended if Age 46-80 years, 20 pack-year currently smoking OR have quit w/in 15years.) does not qualify.   Lung Cancer Screening Referral:   Additional Screening:  Hepatitis C Screening: does not qualify; Completed 2022  Vision Screening: Recommended annual ophthalmology exams for early detection of glaucoma and other disorders of the eye. Is the patient up to date with their annual eye exam?  Yes  Who is the provider or what is the name of the office in which the patient attends annual eye exams? [facility] If pt is not established with a provider, would they like to be referred to a provider to establish care? No .  Dental Screening: Recommended annual dental exams for proper oral hygiene    Community Resource Referral / Chronic Care Management: CRR required this visit?  No   CCM required this visit?  No    Plan:     I have personally reviewed and noted the following in the patient's chart:   Medical and social history Use of alcohol, tobacco or illicit drugs  Current medications and supplements including opioid prescriptions. Patient is not currently taking opioid prescriptions. Functional ability and status Nutritional status Physical activity Advanced directives List of other physicians Hospitalizations, surgeries, and ER visits in previous 12 months Vitals Screenings to include cognitive, depression, and falls Referrals and appointments  In addition, I have reviewed and discussed with patient certain preventive protocols, quality metrics, and best practice recommendations. A written personalized care plan for preventive services as well as general preventive health recommendations were provided to patient.     Remi Haggard, LPN   06/21/1094   After Visit Summary: (MyChart) Due to this being a telephonic visit, the after visit summary with patients personalized plan was offered to patient via MyChart   Nurse Notes:

## 2022-12-16 NOTE — Patient Instructions (Signed)
Keith Cunningham , Thank you for taking time to come for your Medicare Wellness Visit. I appreciate your ongoing commitment to your health goals. Please review the following plan we discussed and let me know if I can assist you in the future.   Screening recommendations/referrals: Colonoscopy: no longer required Recommended yearly ophthalmology/optometry visit for glaucoma screening and checkup Recommended yearly dental visit for hygiene and checkup  Vaccinations: Influenza vaccine: declined Pneumococcal vaccine: declined Tdap vaccine: up to date Shingles vaccine: Education provided    Advanced directives: on file    Preventive Care 65 Years and Older, Male Preventive care refers to lifestyle choices and visits with your health care provider that can promote health and wellness. What does preventive care include? A yearly physical exam. This is also called an annual well check. Dental exams once or twice a year. Routine eye exams. Ask your health care provider how often you should have your eyes checked. Personal lifestyle choices, including: Daily care of your teeth and gums. Regular physical activity. Eating a healthy diet. Avoiding tobacco and drug use. Limiting alcohol use. Practicing safe sex. Taking low doses of aspirin every day. Taking vitamin and mineral supplements as recommended by your health care provider. What happens during an annual well check? The services and screenings done by your health care provider during your annual well check will depend on your age, overall health, lifestyle risk factors, and family history of disease. Counseling  Your health care provider may ask you questions about your: Alcohol use. Tobacco use. Drug use. Emotional well-being. Home and relationship well-being. Sexual activity. Eating habits. History of falls. Memory and ability to understand (cognition). Work and work Astronomer. Screening  You may have the following tests or  measurements: Height, weight, and BMI. Blood pressure. Lipid and cholesterol levels. These may be checked every 5 years, or more frequently if you are over 72 years old. Skin check. Lung cancer screening. You may have this screening every year starting at age 76 if you have a 30-pack-year history of smoking and currently smoke or have quit within the past 15 years. Fecal occult blood test (FOBT) of the stool. You may have this test every year starting at age 8. Flexible sigmoidoscopy or colonoscopy. You may have a sigmoidoscopy every 5 years or a colonoscopy every 10 years starting at age 37. Prostate cancer screening. Recommendations will vary depending on your family history and other risks. Hepatitis C blood test. Hepatitis B blood test. Sexually transmitted disease (STD) testing. Diabetes screening. This is done by checking your blood sugar (glucose) after you have not eaten for a while (fasting). You may have this done every 1-3 years. Abdominal aortic aneurysm (AAA) screening. You may need this if you are a current or former smoker. Osteoporosis. You may be screened starting at age 38 if you are at high risk. Talk with your health care provider about your test results, treatment options, and if necessary, the need for more tests. Vaccines  Your health care provider may recommend certain vaccines, such as: Influenza vaccine. This is recommended every year. Tetanus, diphtheria, and acellular pertussis (Tdap, Td) vaccine. You may need a Td booster every 10 years. Zoster vaccine. You may need this after age 60. Pneumococcal 13-valent conjugate (PCV13) vaccine. One dose is recommended after age 81. Pneumococcal polysaccharide (PPSV23) vaccine. One dose is recommended after age 23. Talk to your health care provider about which screenings and vaccines you need and how often you need them. This information is not intended  to replace advice given to you by your health care provider. Make sure  you discuss any questions you have with your health care provider. Document Released: 06/29/2015 Document Revised: 02/20/2016 Document Reviewed: 04/03/2015 Elsevier Interactive Patient Education  2017 [place] Prevention in the Home Falls can cause injuries. They can happen to people of all ages. There are many things you can do to make your home safe and to help prevent falls. What can I do on the outside of my home? Regularly fix the edges of walkways and driveways and fix any cracks. Remove anything that might make you trip as you walk through a door, such as a raised step or threshold. Trim any bushes or trees on the path to your home. Use bright outdoor lighting. Clear any walking paths of anything that might make someone trip, such as rocks or tools. Regularly check to see if handrails are loose or broken. Make sure that both sides of any steps have handrails. Any raised decks and porches should have guardrails on the edges. Have any leaves, snow, or ice cleared regularly. Use sand or salt on walking paths during winter. Clean up any spills in your garage right away. This includes oil or grease spills. What can I do in the bathroom? Use night lights. Install grab bars by the toilet and in the tub and shower. Do not use towel bars as grab bars. Use non-skid mats or decals in the tub or shower. If you need to sit down in the shower, use a plastic, non-slip stool. Keep the floor dry. Clean up any water that spills on the floor as soon as it happens. Remove soap buildup in the tub or shower regularly. Attach bath mats securely with double-sided non-slip rug tape. Do not have throw rugs and other things on the floor that can make you trip. What can I do in the bedroom? Use night lights. Make sure that you have a light by your bed that is easy to reach. Do not use any sheets or blankets that are too big for your bed. They should not hang down onto the floor. Have a firm  chair that has side arms. You can use this for support while you get dressed. Do not have throw rugs and other things on the floor that can make you trip. What can I do in the kitchen? Clean up any spills right away. Avoid walking on wet floors. Keep items that you use a lot in easy-to-reach places. If you need to reach something above you, use a strong step stool that has a grab bar. Keep electrical cords out of the way. Do not use floor polish or wax that makes floors slippery. If you must use wax, use non-skid floor wax. Do not have throw rugs and other things on the floor that can make you trip. What can I do with my stairs? Do not leave any items on the stairs. Make sure that there are handrails on both sides of the stairs and use them. Fix handrails that are broken or loose. Make sure that handrails are as long as the stairways. Check any carpeting to make sure that it is firmly attached to the stairs. Fix any carpet that is loose or worn. Avoid having throw rugs at the top or bottom of the stairs. If you do have throw rugs, attach them to the floor with carpet tape. Make sure that you have a light switch at the top of the stairs and  the bottom of the stairs. If you do not have them, ask someone to add them for you. What else can I do to help prevent falls? Wear shoes that: Do not have high heels. Have rubber bottoms. Are comfortable and fit you well. Are closed at the toe. Do not wear sandals. If you use a stepladder: Make sure that it is fully opened. Do not climb a closed stepladder. Make sure that both sides of the stepladder are locked into place. Ask someone to hold it for you, if possible. Clearly mark and make sure that you can see: Any grab bars or handrails. First and last steps. Where the edge of each step is. Use tools that help you move around (mobility aids) if they are needed. These include: Canes. Walkers. Scooters. Crutches. Turn on the lights when you go  into a dark area. Replace any light bulbs as soon as they burn out. Set up your furniture so you have a clear path. Avoid moving your furniture around. If any of your floors are uneven, fix them. If there are any pets around you, be aware of where they are. Review your medicines with your doctor. Some medicines can make you feel dizzy. This can increase your chance of falling. Ask your doctor what other things that you can do to help prevent falls. This information is not intended to replace advice given to you by your health care provider. Make sure you discuss any questions you have with your health care provider. Document Released: 03/29/2009 Document Revised: 11/08/2015 Document Reviewed: 07/07/2014 Elsevier Interactive Patient Education  2017 [employer].

## 2022-12-29 DIAGNOSIS — H52202 Unspecified astigmatism, left eye: Secondary | ICD-10-CM | POA: Diagnosis not present

## 2022-12-29 DIAGNOSIS — H353121 Nonexudative age-related macular degeneration, left eye, early dry stage: Secondary | ICD-10-CM | POA: Diagnosis not present

## 2022-12-29 DIAGNOSIS — H31001 Unspecified chorioretinal scars, right eye: Secondary | ICD-10-CM | POA: Diagnosis not present

## 2022-12-29 DIAGNOSIS — H5310 Unspecified subjective visual disturbances: Secondary | ICD-10-CM | POA: Diagnosis not present

## 2022-12-30 ENCOUNTER — Other Ambulatory Visit (HOSPITAL_COMMUNITY): Payer: Self-pay | Admitting: Optometry

## 2022-12-30 DIAGNOSIS — G453 Amaurosis fugax: Secondary | ICD-10-CM

## 2022-12-31 ENCOUNTER — Ambulatory Visit (HOSPITAL_COMMUNITY)
Admission: RE | Admit: 2022-12-31 | Discharge: 2022-12-31 | Disposition: A | Discharge status: Home / Self Care | Payer: Medicare Other | Source: Ambulatory Visit | Attending: Cardiology | Admitting: Cardiology

## 2022-12-31 DIAGNOSIS — G453 Amaurosis fugax: Secondary | ICD-10-CM | POA: Diagnosis not present

## 2023-07-31 ENCOUNTER — Other Ambulatory Visit: Payer: Self-pay | Admitting: Cardiology

## 2023-08-04 DIAGNOSIS — M542 Cervicalgia: Secondary | ICD-10-CM | POA: Diagnosis not present

## 2023-08-04 DIAGNOSIS — M545 Low back pain, unspecified: Secondary | ICD-10-CM | POA: Diagnosis not present

## 2023-08-04 DIAGNOSIS — M791 Myalgia, unspecified site: Secondary | ICD-10-CM | POA: Diagnosis not present

## 2023-08-11 DIAGNOSIS — M79662 Pain in left lower leg: Secondary | ICD-10-CM | POA: Diagnosis not present

## 2023-08-11 DIAGNOSIS — M79661 Pain in right lower leg: Secondary | ICD-10-CM | POA: Diagnosis not present

## 2023-08-11 DIAGNOSIS — M545 Low back pain, unspecified: Secondary | ICD-10-CM | POA: Diagnosis not present

## 2023-08-12 DIAGNOSIS — M545 Low back pain, unspecified: Secondary | ICD-10-CM | POA: Diagnosis not present

## 2023-08-20 DIAGNOSIS — M4722 Other spondylosis with radiculopathy, cervical region: Secondary | ICD-10-CM | POA: Diagnosis not present

## 2023-09-04 DIAGNOSIS — M5416 Radiculopathy, lumbar region: Secondary | ICD-10-CM | POA: Diagnosis not present

## 2023-09-07 DIAGNOSIS — B078 Other viral warts: Secondary | ICD-10-CM | POA: Diagnosis not present

## 2023-09-07 DIAGNOSIS — L820 Inflamed seborrheic keratosis: Secondary | ICD-10-CM | POA: Diagnosis not present

## 2023-09-10 DIAGNOSIS — M5416 Radiculopathy, lumbar region: Secondary | ICD-10-CM | POA: Diagnosis not present

## 2023-10-05 DIAGNOSIS — M5416 Radiculopathy, lumbar region: Secondary | ICD-10-CM | POA: Diagnosis not present

## 2023-10-08 ENCOUNTER — Encounter: Payer: Self-pay | Admitting: Cardiology

## 2023-10-08 ENCOUNTER — Ambulatory Visit: Payer: Medicare Other | Attending: Cardiology | Admitting: Cardiology

## 2023-10-08 VITALS — BP 162/98 | HR 86 | Ht 66.0 in | Wt 166.0 lb

## 2023-10-08 DIAGNOSIS — R030 Elevated blood-pressure reading, without diagnosis of hypertension: Secondary | ICD-10-CM

## 2023-10-08 DIAGNOSIS — E7849 Other hyperlipidemia: Secondary | ICD-10-CM

## 2023-10-08 DIAGNOSIS — I25118 Atherosclerotic heart disease of native coronary artery with other forms of angina pectoris: Secondary | ICD-10-CM

## 2023-10-08 DIAGNOSIS — Z951 Presence of aortocoronary bypass graft: Secondary | ICD-10-CM

## 2023-10-08 NOTE — Progress Notes (Signed)
 Cardiology Office Note:  .   Date:  10/08/2023  ID:  Keith Cunningham, DOB 1940/11/07, MRN [id number] PCP: Lavada Porteous, DO  [facility] Providers Cardiologist:  Dorothye Gathers, MD     History of Present Illness: .   Keith Cunningham is a 83 y.o. male Discussed the use of AI scribe software for clinical note transcription with the patient, who gave verbal consent to proceed.  History of Present Illness Keith Cunningham is an 83 year old male with coronary artery disease who presents for follow-up.  He has a history of coronary artery disease and underwent coronary artery bypass grafting (CABG) in 2013, with saphenous vein grafts (SVG) to obtuse marginal one and two, left internal mammary artery (LIMA) to left anterior descending (LAD), and SVG to posterior descending artery (PDA) in the setting of non-ST elevation myocardial infarction (NSTEMI). No current chest pain, shortness of breath, or fainting episodes. He states, 'heart's feeling good.'  He has hyperlipidemia with a history of statin intolerance. He is currently on a low dose of rosuvastatin  (Crestor ) 5 mg daily, which he tolerates well. His LDL cholesterol was 65 mg/dL last year. He also takes 81 mg of aspirin  daily, CoQ10, and omega-3 fish oil (1000 mg daily).  He reports a herniated disc in his back, which has been present for five weeks, and he is undergoing treatment with shots to satisfy insurance requirements before surgery. He notes that pain affects his blood pressure readings.  His echocardiogram in 2023 showed an ejection fraction (EF) of 70% with mild left ventricular hypertrophy (LVH). A prior EKG showed normal sinus rhythm with left anterior fascicular block and poor R-wave progression. Recent EKG shows sinus rhythm, right bundle branch block, left anterior fascicular block, and bifascicular block, all stable from last year.  He maintains an active lifestyle, exercising every morning and lifting weights. 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       Studies Reviewed: Aaron Aas   EKG Interpretation Date/Time:  Thursday October 08 2023 09:05:19 EDT Ventricular Rate:  86 PR Interval:  166 QRS Duration:  142 QT Interval:  400 QTC Calculation: 478 R Axis:   -65  Text Interpretation: Normal sinus rhythm Right bundle branch block Left anterior fascicular block Bifascicular block Possible Lateral infarct , age undetermined When compared with ECG of 25-Mar-2017 17:24, No significant change since last tracing Confirmed by Dorothye Gathers ([id number]) on 10/08/2023 9:10:53 AM    Results LABS LDL: 65 mg/dL (1308) M5H: 8.4% (6962) Hb: 15.7 g/dL (9528) Cr: 0.9 mg/dL (4132)  DIAGNOSTIC Echocardiogram: EF 70%, mild LVH (2023) EKG: Sinus rhythm, 86 bpm, right bundle branch block, left anterior fascicular block, bifascicular block, stable (2025) Risk Assessment/Calculations:           Physical Exam:   VS:  BP (!) 162/98   Pulse 86   Ht 5\' 6"  (1.676 m)   Wt 166 lb (75.3 kg)   SpO2 97%   BMI 26.79 kg/m    Wt Readings from Last 3 Encounters:  10/08/23 166 lb (75.3 kg)  09/18/22 160 lb (72.6 kg)  11/21/21 158 lb 9.6 oz (71.9 kg)    GEN: Well nourished, well developed in no acute distress NECK: No JVD; No carotid bruits CARDIAC: Rare ectopy RRR, no murmurs, no rubs, no gallops RESPIRATORY:  Clear to auscultation without  rales, wheezing or rhonchi  ABDOMEN: Soft, non-tender, non-distended EXTREMITIES:  No edema; No deformity   ASSESSMENT AND PLAN: .    Assessment and Plan Assessment & Plan Herniated disc Herniated disc causing significant pain, affecting blood pressure readings. He is undergoing insurance-mandated steps before surgery, including receiving injections. - Continue with insurance-mandated steps for surgery approval.  Hypertension Blood pressure  was elevated during the visit, likely due to pain from a herniated disc. No chronic hypertension diagnosis was discussed. He is advised to monitor blood pressure at home to ensure it remains within target range. - Monitor blood pressure at home using arm and wrist monitors. - Aim for blood pressure readings in the 130s/120s over 70s/80s. - Repeat here was 148/78  Hyperlipidemia with statin intolerance Hyperlipidemia managed with low-dose rosuvastatin  due to previous statin intolerance. LDL cholesterol was 65 last year, indicating good control. He tolerates low-dose statin without significant side effects. - Continue rosuvastatin  5 mg daily. - Continue aspirin  81 mg daily. - Continue CoQ10 200 mg daily. - Continue omega-3 fatty acids 1000 mg daily.          Signed, Dorothye Gathers, MD

## 2023-10-08 NOTE — Patient Instructions (Signed)

## 2023-10-09 ENCOUNTER — Other Ambulatory Visit: Payer: Self-pay | Admitting: Orthopedic Surgery

## 2023-10-09 DIAGNOSIS — M542 Cervicalgia: Secondary | ICD-10-CM

## 2023-10-09 DIAGNOSIS — G959 Disease of spinal cord, unspecified: Secondary | ICD-10-CM | POA: Diagnosis not present

## 2023-10-09 DIAGNOSIS — M4696 Unspecified inflammatory spondylopathy, lumbar region: Secondary | ICD-10-CM | POA: Diagnosis not present

## 2023-10-17 ENCOUNTER — Ambulatory Visit
Admission: RE | Admit: 2023-10-17 | Discharge: 2023-10-17 | Disposition: A | Discharge status: Home / Self Care | Source: Ambulatory Visit | Attending: Orthopedic Surgery | Admitting: Orthopedic Surgery

## 2023-10-17 ENCOUNTER — Other Ambulatory Visit: Payer: Self-pay | Admitting: Cardiology

## 2023-10-17 DIAGNOSIS — M542 Cervicalgia: Secondary | ICD-10-CM

## 2023-10-17 DIAGNOSIS — M4802 Spinal stenosis, cervical region: Secondary | ICD-10-CM | POA: Diagnosis not present

## 2023-10-21 NOTE — Progress Notes (Unsigned)
 [facility]  PATIENT: Keith Cunningham DOB: [birth year]  REFERRING DOCTOR OR PCP:  Dr. Jackee Marus SOURCE: Patient, notes from orthopedics, imaging reports, MRI images personally reviewed.  _________________________________   HISTORICAL  CHIEF COMPLAINT:  Chief Complaint  Patient presents with   Follow-up    Pt in 11 with wife Pt states numbness and tingling in  both legs and feet     HISTORY OF PRESENT ILLNESS:  I had the pleasure of seeing your patient, Keith Cunningham, at [facility] for neurologic consultation regarding his leg numbness and weakness.  He is an 83 year old man who has had stable gait difficulties and tremor since 2020 and worsening back pain over the last year.   Before 2024, he would only get occasional back and neck pain.  He plays golf and notes in March 2024 when he started playing again he had more lower back pain and some difficulty.    He began to note issues standing up straight after he moved over to get the all.  He was doing even worse this year and he had multiple spinal injections (ESI's).   These have not helped.    He also has had trigger point injections.  Unfortunately, none of these procedures has given him significant benefit.  Looking at the notes, it does not appear as though he has had medial branch blocks or facet joint injections  Currently, he has tingling in both legs (more than in feet).  He has pain in his lower back.   He gets a lot of back spasms.   He reports the worst pain is in the lower back (points to L5-S2).  He feels mildly weaker since the pain worsened.    In 2014, after CABG, he had the onset of vertigo,, right hearing loss, left arm weakness and right visual loss while in the hospital.  He reports that gait has been poor since that time though this has not prevented him from doing tasks such as playing golf.  On 3 different occasions, I asked both his wife and him if he felt that his current  gait has significantly change since he began to experience more pain and he reports that it is essentially stable since his CABG in 2014.  He does note that when the back is hurting more his gait does worse.Keith Cunningham  He notes that the vertigo was worse when he was in the hospital in 2014 but has been stable over the past few years and occurs most of the time when he stands up from a seated position and then quickly improves.  Once up, he feels his balance is better.  The reduced hearing on the right started in 2014 and has progressed.  The left arm weakness he experienced while in the hospital resolved.  Vision is stable.  He reports bladder function is stable over the past few years.  He has nocturia once a night and no incontinence.   Imaging personally reviewed: MRI of the cervical spine 10/17/2023 (not officially read yet) by my read shows: The visible brain and cervicomedullary junction appears normal.  The spinal cord has a normal caliber and has normal signal.  Mild disc bulging at C2-C3 causing mild foraminal narrowing on the left but no spinal stenosis or nerve root compression.  C3-C4: There is minimal disc bulging but no spinal stenosis or nerve root compression.  At C4-C5: There is minimal disc bulging causing mild foraminal narrowing but no spinal stenosis or nerve root  compression.  At C5-C6, there is disc bulging, ligamenta flava hypertrophy and mild facet hypertrophy causing mild left foraminal narrowing and mild spinal stenosis.  No nerve root compression.  At C6-C7: There is disc bulging and left uncovertebral spurring causing left foraminal narrowing and borderline spinal stenosis.  No nerve root compression.    MRI lumbar spine shows L4L5 disc bulging, facet hypertrophy and mild spinal stenosis.  He has bilateral lateral recess stenosis that might affect the L5 nerve roots.    MRI of the brain 02/03/2019  with and without contrast shows the following: 1.   Mild generalized cortical atrophy and  mild to moderate chronic microvascular ischemic change.  Both of these have mildly progressed when compared to the 2014 MRI. 2.   Mild chronic ethmoid sinusitis. 3.   The 7th/8th nerves and mastoid air cells appear normal.   4.   There are no acute findings and there is a normal enhancement pattern.  REVIEW OF SYSTEMS: Constitutional: No fevers, chills, sweats, or change in appetite Eyes: He has right visual loss  ear, nose and throat: He has right hearing loss.  No ear pain, nasal congestion, sore throat Cardiovascular: No chest pain, palpitations.  He has coronary artery disease, status post CABG Respiratory:  No shortness of breath at rest or with exertion.   No wheezes GastrointestinaI: No nausea, vomiting, diarrhea, abdominal pain, fecal incontinence Genitourinary:  No dysuria, urinary retention or frequency.  No nocturia. Musculoskeletal: As above Integumentary: No rash, pruritus, skin lesions Neurological: as above Psychiatric: No depression at this time.  No anxiety Endocrine: No palpitations, diaphoresis, change in appetite, change in weigh or increased thirst Hematologic/Lymphatic:  No anemia, purpura, petechiae. Allergic/Immunologic: No itchy/runny eyes, nasal congestion, recent allergic reactions, rashes  ALLERGIES: Allergies  Allergen Reactions   Atorvastatin  Nausea And Vomiting and Other (See Comments)    CONSTIPATION FATIGUE   Doxycycline  Rash   Statins Other (See Comments)    Myalgias    Codeine Nausea And Vomiting    HOME MEDICATIONS:  Current Outpatient Medications:    acetaminophen  (TYLENOL ) 500 MG tablet, Take 500 mg by mouth every 6 (six) hours as needed for moderate pain., Disp: , Rfl:    aspirin  81 MG EC tablet, Take 1 tablet by mouth daily. 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    Staph infection  03/2017   had I and D of infected abscess     PAST SURGICAL HISTORY: Past Surgical History:  Procedure Laterality Date   CARDIAC CATHETERIZATION  04/21/2012   CATARACT EXTRACTION, BILATERAL Bilateral    CHOLECYSTECTOMY N/A 05/23/2021   Procedure: LAPAROSCOPIC CHOLECYSTECTOMY;  Surgeon: Lujean Sake, MD;  Location: [facility] OR;  Service: General;  Laterality: N/A;   CORONARY ARTERY BYPASS GRAFT  05/10/2012   Procedure: CORONARY ARTERY BYPASS GRAFTING (CABG);  Surgeon: Norita Beauvais, MD;  Location: [facility] OR;  Service: Open Heart Surgery;  Laterality: N/A;  times three using Left Greater Saphenous Vein Graft harvested endoscopically and Left Internal Mammary Artery   EXCISIONAL HEMORRHOIDECTOMY  2012   "tried to shrink 1st w/needle; cut them out 2 wk later" (05/06/2012)   EYE SURGERY     INCISION AND DRAINAGE ABSCESS Right 03/25/2017   Procedure: INCISION AND DRAINAGE ABSCESS;  Surgeon: Caralyn Chandler, MD;  Location: [facility] ORS;  Service: General;  Laterality: Right;   INTRAOPERATIVE TRANSESOPHAGEAL ECHOCARDIOGRAM  05/10/2012   Procedure: INTRAOPERATIVE TRANSESOPHAGEAL ECHOCARDIOGRAM;  Surgeon: Norita Beauvais, MD;   Location: [facility] OR;  Service: Open Heart Surgery;  Laterality: N/A;   MELANOMA EXCISION  2009   "left side of head" (05/06/2012)   TONSILLECTOMY AND ADENOIDECTOMY  ~ 1950   TOTAL HIP ARTHROPLASTY Left 05/26/2016   Procedure: TOTAL HIP ARTHROPLASTY ANTERIOR APPROACH;  Surgeon: Neil Balls, MD;  Location: [facility] OR;  Service: Orthopedics;  Laterality: Left;   TOTAL HIP ARTHROPLASTY Right 06/25/2018   Procedure: TOTAL HIP ARTHROPLASTY ANTERIOR APPROACH;  Surgeon: Neil Balls, MD;  Location: [facility];  Service: Orthopedics;  Laterality: Right;    FAMILY HISTORY: Family History  Problem Relation Age of Onset   Diabetes Mellitus II Mother    Heart disease Mother    Stroke Mother    Heart attack Father    Diabetes Sister    Alcohol abuse Sister    Neuropathy Neg Hx     SOCIAL HISTORY: Social History   Socioeconomic History   Marital status: Married    Spouse name: Not on file   Number of children: Not on file   Years of education: Not on file   Highest education level: Not on file  Occupational History   Occupation: retired    Comment: Airline pilot  Tobacco Use   Smoking status: Never   Smokeless tobacco: Never  Vaping Use   Vaping status: Never Used  Substance and Sexual Activity   Alcohol use: No   Drug use: No   Sexual activity: Yes  Other Topics Concern   Not on file  Social History Narrative   Lives with wife   Caffeine use: Tea: 2 glasses per day (8 oz_   Coffee: every morning    Right handed    Retired    Chief Executive Officer Drivers of Corporate investment banker Strain: Low Risk  (12/16/2022)   Overall Financial Resource Strain (CARDIA)    Difficulty of Paying Living Expenses: Not hard at all  Food Insecurity: No Food Insecurity (12/16/2022)   Hunger Vital Sign    Worried About Running Out of Food in the Last Year: Never true    Ran Out of Food in the Last Year: Never true  Transportation Needs: No Transportation Needs (12/16/2022)   PRAPARE - Administrator, Civil Service  (Medical): No    Lack of Transportation (Non-Medical): No  Physical Activity: Insufficiently Active (12/16/2022)   Exercise Vital Sign  Days of Exercise per Week: 3 days    Minutes of Exercise per Session: 30 min  Stress: No Stress Concern Present (12/16/2022)   [employer] of Occupational Health - Occupational Stress Questionnaire    Feeling of Stress : Not at all  Social Connections: Moderately Isolated (12/16/2022)   Social Connection and Isolation Panel [NHANES]    Frequency of Communication with Friends and Family: Twice a week    Frequency of Social Gatherings with Friends and Family: Twice a week    Attends Religious Services: Never    Database administrator or Organizations: No    Attends Banker Meetings: Never    Marital Status: Married  Catering manager Violence: Not At Risk (12/16/2022)   Humiliation, Afraid, Rape, and Kick questionnaire    Fear of Current or Ex-Partner: No    Emotionally Abused: No    Physically Abused: No    Sexually Abused: No       PHYSICAL EXAM  Vitals:   10/22/23 1034  BP: (!) 146/80  Pulse: 74  Weight: 165 lb (74.8 kg)  Height: 5\' 6"  (1.676 m)    Body mass index is 26.63 kg/m.   General: The patient is well-developed and well-nourished and in no acute distress  HEENT:  Head is Schuyler/AT.  Sclera are anicteric.  Funduscopic exam shows normal optic discs and retinal vessels.  Neck: No carotid bruits are noted.  The neck is nontender.  Cardiovascular: The heart has a regular rate and rhythm with a normal S1 and S2. There were no murmurs, gallops or rubs.    Skin: Extremities are without rash or  edema.  Musculoskeletal:  Back is nontender  Neurologic Exam  Mental status: The patient is alert and oriented x 3 at the time of the examination. The patient has apparent normal recent and remote memory, with an apparently normal attention span and concentration ability.   Speech is normal.  Cranial nerves: Extraocular  movements are full. Pupils are equal, round, and reactive to light and accomodation.  Visual fields are full.  Facial symmetry is present. There is good facial sensation to soft touch bilaterally.Facial strength is normal.  Trapezius and sternocleidomastoid strength is normal. No dysarthria is noted.  The tongue is midline, and the patient has symmetric elevation of the soft palate. No obvious hearing deficits are noted.  Motor: He has an intention tremor predominantly in the hands, left greater than right.  Muscle bulk is normal.   Tone is normal..  Specifically no cogwheeling.  Strength is  5 / 5 in all 4 extremities.   Sensory: Sensory testing is intact to pinprick, soft touch and vibration sensation in all 4 extremities.  Coordination: Cerebellar testing reveals good finger-nose-finger and heel-to-shin bilaterally.  Gait and station: Station is unsteady when he first stands up and then improves after about 15 seconds.   Gait is unsteady, worse with his initial steps and then improving.   He has a widened stance but reasonably good stride.  He turns in 6 steps.  He has mild retropulsion.  Tandem cannot safely be tested. Romberg is negative.   Reflexes: Deep tendon reflexes are symmetric and normal bilaterally.   Plantar responses are flexor.    DIAGNOSTIC DATA (LABS, IMAGING, TESTING) - I reviewed patient records, labs, notes, testing and imaging myself where available.  Lab Results  Component Value Date   WBC 6.5 09/18/2022   HGB 16.7 09/18/2022   HCT 49.8 09/18/2022   MCV 95 09/18/2022  PLT 195 09/18/2022      Component Value Date/Time   NA 143 09/18/2022 0910   K 4.8 09/18/2022 0910   CL 103 09/18/2022 0910   CO2 25 09/18/2022 0910   GLUCOSE 105 (H) 09/18/2022 0910   GLUCOSE 95 06/17/2018 0821   BUN 13 09/18/2022 0910   CREATININE 0.91 09/18/2022 0910   CALCIUM  9.7 09/18/2022 0910   PROT 6.6 09/18/2022 0910   ALBUMIN  4.4 09/18/2022 0910   AST 28 09/18/2022 0910   ALT 21  09/18/2022 0910   ALKPHOS 72 09/18/2022 0910   BILITOT 0.5 09/18/2022 0910   GFRNONAA >60 06/17/2018 0821   GFRAA >60 06/17/2018 0821   Lab Results  Component Value Date   CHOL 147 09/18/2022   HDL 57 09/18/2022   LDLCALC 65 09/18/2022   TRIG 148 09/18/2022   CHOLHDL 2.6 09/18/2022   Lab Results  Component Value Date   HGBA1C 5.7 (H) 08/06/2021   Lab Results  Component Value Date   VITAMINB12 756 04/22/2012   Lab Results  Component Value Date   TSH 2.54 07/09/2017       ASSESSMENT AND PLAN  Gait disturbance  Essential tremor  Chronic midline low back pain without sciatica  Facet hypertrophy   In summary, Mr. Reager is an 83 year old man reporting neurologic symptoms since 2014 when he had CABG.  Over the past year he has had worsening lower back pain.  He has some difficulty with his gait but feels the majority of the problems started in 2014 and that there has not been much progression.  He feels off balance when he initially stands.    As far as the pain, this is fairly localized to the midline lower back.  MRI does show mild spinal stenosis, lateral recess stenosis that could affect the L5 nerve roots and facet hypertrophy at L4-L5.  ESI's have not helped.  Consider facet joint injection/medial branch blocks with RFA if good response.  Etiology of his reduced gait is unclear and is likely multifactorial due to vertigo, age-related changes that have been noted on MRI, and arthritic change.  Because he and his wife both state that his gait is just a little worse over the past few years, I do not think that this would be due to new or reversible neurologic change.  Therefore, neuroimaging is not needed.  He does not have evidence of any significant neuropathy on exam.  I do not believe he has Parkinson's.  His tremor is consistent with essential tremor.  Findings on the recent cervical spine MRI are not significant enough to affect gait  I have advised them to let us   know if he experiences much progression in his gait disturbance.  If so, I would reconsider brain MRI.  He will return to see me if progression is noted.  Thank you for asking me to see Mr. Marlane Silver.  Please let me know if there are further assistance with him or other patients in the future. Fahmida Jurich A. Godwin Lat, MD, [facility] 10/22/2023, 7:59 PM Certified in Neurology, Clinical Neurophysiology, Sleep Medicine and Neuroimaging  [facility] [address] [place], [place] [address] [phone]

## 2023-10-22 ENCOUNTER — Encounter: Payer: Self-pay | Admitting: Neurology

## 2023-10-22 ENCOUNTER — Ambulatory Visit: Admitting: Neurology

## 2023-10-22 VITALS — BP 146/80 | HR 74 | Ht 66.0 in | Wt 165.0 lb

## 2023-10-22 DIAGNOSIS — R269 Unspecified abnormalities of gait and mobility: Secondary | ICD-10-CM | POA: Diagnosis not present

## 2023-10-22 DIAGNOSIS — M47819 Spondylosis without myelopathy or radiculopathy, site unspecified: Secondary | ICD-10-CM

## 2023-10-22 DIAGNOSIS — G250 Essential tremor: Secondary | ICD-10-CM | POA: Diagnosis not present

## 2023-10-22 DIAGNOSIS — G8929 Other chronic pain: Secondary | ICD-10-CM

## 2023-10-22 DIAGNOSIS — M545 Low back pain, unspecified: Secondary | ICD-10-CM

## 2023-10-26 DIAGNOSIS — M545 Low back pain, unspecified: Secondary | ICD-10-CM | POA: Diagnosis not present

## 2023-11-05 ENCOUNTER — Encounter: Payer: Self-pay | Admitting: Podiatry

## 2023-11-05 ENCOUNTER — Ambulatory Visit: Admitting: Podiatry

## 2023-11-05 VITALS — Ht 66.0 in | Wt 165.0 lb

## 2023-11-05 DIAGNOSIS — L84 Corns and callosities: Secondary | ICD-10-CM

## 2023-11-05 DIAGNOSIS — L309 Dermatitis, unspecified: Secondary | ICD-10-CM

## 2023-11-06 NOTE — Progress Notes (Signed)
 Subjective:   Patient ID: Keith Cunningham, male   DOB: 83 y.o.   MRN: [id number]   HPI Patient presents stating he is getting a lot of cracking flaking and peeling skin both feet and was wondering if there is any treatments that might be available.  Has tried different creams and ointments without relief and it presents with caregiver.  Does not smoke likes to be active   Review of Systems  All other systems reviewed and are negative.       Objective:  Physical Exam Vitals and nursing note reviewed.  Constitutional:      Appearance: He is well-developed.  Pulmonary:     Effort: Pulmonary effort is normal.  Musculoskeletal:        General: Normal range of motion.  Skin:    General: Skin is warm.  Neurological:     Mental Status: He is alert.     Neurovascular status intact muscle strength found to be adequate range of motion within normal limits with patient noted to have cracking skin in the mid arch area bilaterally extending into the digits family history of condition localized no proximal edema or edema drainage noted     Assessment:  Difficult pathological condition also dealing with this on his hands     Plan:  H we reviewed different medicines he has tried in the past do not recommend anything new except for going to start the utilization of Vaseline under occlusion twice a week and soaks as needed.  Also has very sweaty feet and we discussed treatment options for this to

## 2023-11-16 DIAGNOSIS — M47812 Spondylosis without myelopathy or radiculopathy, cervical region: Secondary | ICD-10-CM | POA: Diagnosis not present

## 2023-11-16 DIAGNOSIS — M47816 Spondylosis without myelopathy or radiculopathy, lumbar region: Secondary | ICD-10-CM | POA: Diagnosis not present

## 2023-11-18 DIAGNOSIS — M47816 Spondylosis without myelopathy or radiculopathy, lumbar region: Secondary | ICD-10-CM | POA: Diagnosis not present

## 2023-11-26 DIAGNOSIS — M47812 Spondylosis without myelopathy or radiculopathy, cervical region: Secondary | ICD-10-CM | POA: Diagnosis not present

## 2023-12-21 DIAGNOSIS — M47816 Spondylosis without myelopathy or radiculopathy, lumbar region: Secondary | ICD-10-CM | POA: Diagnosis not present

## 2023-12-22 DIAGNOSIS — Z85820 Personal history of malignant melanoma of skin: Secondary | ICD-10-CM | POA: Diagnosis not present

## 2023-12-22 DIAGNOSIS — L858 Other specified epidermal thickening: Secondary | ICD-10-CM | POA: Diagnosis not present

## 2023-12-22 DIAGNOSIS — D225 Melanocytic nevi of trunk: Secondary | ICD-10-CM | POA: Diagnosis not present

## 2023-12-22 DIAGNOSIS — L821 Other seborrheic keratosis: Secondary | ICD-10-CM | POA: Diagnosis not present

## 2023-12-22 DIAGNOSIS — D1801 Hemangioma of skin and subcutaneous tissue: Secondary | ICD-10-CM | POA: Diagnosis not present

## 2023-12-30 DIAGNOSIS — H31003 Unspecified chorioretinal scars, bilateral: Secondary | ICD-10-CM | POA: Diagnosis not present

## 2023-12-30 DIAGNOSIS — H52202 Unspecified astigmatism, left eye: Secondary | ICD-10-CM | POA: Diagnosis not present

## 2024-01-12 DIAGNOSIS — L820 Inflamed seborrheic keratosis: Secondary | ICD-10-CM | POA: Diagnosis not present

## 2024-01-12 DIAGNOSIS — Z85820 Personal history of malignant melanoma of skin: Secondary | ICD-10-CM | POA: Diagnosis not present

## 2024-01-12 DIAGNOSIS — L03011 Cellulitis of right finger: Secondary | ICD-10-CM | POA: Diagnosis not present

## 2024-02-01 ENCOUNTER — Ambulatory Visit (INDEPENDENT_AMBULATORY_CARE_PROVIDER_SITE_OTHER): Admitting: Family Medicine

## 2024-02-01 ENCOUNTER — Encounter: Payer: Self-pay | Admitting: Family Medicine

## 2024-02-01 VITALS — BP 126/78 | HR 88 | Ht 66.0 in | Wt 162.4 lb

## 2024-02-01 DIAGNOSIS — R1031 Right lower quadrant pain: Secondary | ICD-10-CM | POA: Diagnosis not present

## 2024-02-01 NOTE — Progress Notes (Signed)
    SUBJECTIVE:   CHIEF COMPLAINT / HPI:    Keith Cunningham is a 83yo M that pf abm pain - Reports ~1 month of RLQ abm pain - Has hx of gall bladder removal  - Pain is constant, but has occasional sharp pains  - Has been nauseous, but not vomiting. - Feels like abm is tight and feels like it gets bloated.  - Denies dysuria, fevers. No change in BM. No blood in stool.  - They have looked online and he is worried this could be appendicitis    PERTINENT  PMH / PSH: CAD s/p CABG, prior gall bladder removal  OBJECTIVE:   BP 126/78   Pulse 88   Ht 5' 6 (1.676 m)   Wt 162 lb 6.4 oz (73.7 kg)   SpO2 100%   BMI 26.21 kg/m   General: Alert, pleasant man in comfortably in full sentences and walking with normal gait. NAD. HEENT: NCAT. MMM. CV: RRR, no murmurs. Cap refill <2. Resp: CTAB, no wheezing or crackles. Normal WOB on RA.  Abm:  TTP along RLQ. Mild distention, soft. No rebound, guarding, or rigidity. Normal BS.  Ext: Moves all ext spontaneously Skin: Warm, well perfused   ASSESSMENT/PLAN:   Assessment & Plan Right lower quadrant abdominal pain Differential includes developing SBO, constipation, diverticulosis. Low cf appendicitis given lack of fever, duration of symptoms, and lack of peritoneal signs. Low cf urinary source.  - CMP and CBC  - CT abm pelvis w/ con - Encouraged soft diet for 5 days to give bowel rest - Return precautions discussed     Keith Nearing, MD [facility], [facility]

## 2024-02-01 NOTE — Patient Instructions (Signed)
 1) We will check a few labs for you today including your liver labs.  2) We will schedule you for a CT scan of your abdomen  Please seek emergency care if you: - have blood in your stool - unable to eat - vomiting uncontrollably - have fevers - abdomen pain is uncontrolled

## 2024-02-02 LAB — COMPREHENSIVE METABOLIC PANEL WITH GFR
ALT: 20 IU/L (ref 0–44)
AST: 30 IU/L (ref 0–40)
Albumin: 4.3 g/dL (ref 3.7–4.7)
Alkaline Phosphatase: 75 IU/L (ref 44–121)
BUN/Creatinine Ratio: 11 (ref 10–24)
BUN: 10 mg/dL (ref 8–27)
Bilirubin Total: 0.5 mg/dL (ref 0.0–1.2)
CO2: 24 mmol/L (ref 20–29)
Calcium: 9.8 mg/dL (ref 8.6–10.2)
Chloride: 105 mmol/L (ref 96–106)
Creatinine, Ser: 0.92 mg/dL (ref 0.76–1.27)
Globulin, Total: 2.1 g/dL (ref 1.5–4.5)
Glucose: 95 mg/dL (ref 70–99)
Potassium: 4.6 mmol/L (ref 3.5–5.2)
Sodium: 143 mmol/L (ref 134–144)
Total Protein: 6.4 g/dL (ref 6.0–8.5)
eGFR: 84 mL/min/1.73 (ref >59)

## 2024-02-02 LAB — CBC
Hematocrit: 48.1 % (ref 37.5–51.0)
Hemoglobin: 15.9 g/dL (ref 13.0–17.7)
MCH: 31.6 pg (ref 26.6–33.0)
MCHC: 33.1 g/dL (ref 31.5–35.7)
MCV: 96 fL (ref 79–97)
Platelets: 192 x10E3/uL (ref 150–450)
RBC: 5.03 x10E6/uL (ref 4.14–5.80)
RDW: 12.5 % (ref 11.6–15.4)
WBC: 7.2 x10E3/uL (ref 3.4–10.8)

## 2024-02-03 NOTE — Assessment & Plan Note (Signed)
 Differential includes developing SBO, constipation, diverticulosis. Low cf appendicitis given lack of fever, duration of symptoms, and lack of peritoneal signs. Low cf urinary source.  - CMP and CBC  - CT abm pelvis w/ con - Encouraged soft diet for 5 days to give bowel rest - Return precautions discussed

## 2024-02-04 ENCOUNTER — Ambulatory Visit: Payer: Self-pay | Admitting: Family Medicine

## 2024-02-05 ENCOUNTER — Ambulatory Visit (HOSPITAL_COMMUNITY)
Admission: RE | Admit: 2024-02-05 | Discharge: 2024-02-05 | Disposition: A | Discharge status: Home / Self Care | Source: Ambulatory Visit | Attending: Family Medicine | Admitting: Family Medicine

## 2024-02-05 DIAGNOSIS — K449 Diaphragmatic hernia without obstruction or gangrene: Secondary | ICD-10-CM | POA: Diagnosis not present

## 2024-02-05 DIAGNOSIS — R1031 Right lower quadrant pain: Secondary | ICD-10-CM | POA: Insufficient documentation

## 2024-02-05 DIAGNOSIS — K4020 Bilateral inguinal hernia, without obstruction or gangrene, not specified as recurrent: Secondary | ICD-10-CM | POA: Diagnosis not present

## 2024-02-05 MED ORDER — IOHEXOL 300 MG/ML  SOLN
100.0000 mL | Freq: Once | INTRAMUSCULAR | Status: AC | PRN
  Administered 2024-02-05: 100 mL via INTRAVENOUS

## 2024-02-16 ENCOUNTER — Telehealth: Payer: Self-pay

## 2024-02-16 NOTE — Telephone Encounter (Signed)
 Patient's wife calls nurse line requesting call from provider to discuss results from CT scan from 02/05/24.  Will forward to ordering provider Brunilda)  Best call back number is: [phone].  Chiquita JAYSON English, RN

## 2024-02-16 NOTE — Telephone Encounter (Signed)
 Called pt and wife. Discussed that CT scan was overall reassurring, no signs of acute pathology. It showed 2 inguinal hernias w/o strangulation or obstruction.   Pt reports symptoms are improving. Discussed return precautions such as weight loss, blood in stool, uncontrolled pain.

## 2024-06-22 ENCOUNTER — Emergency Department (HOSPITAL_COMMUNITY)

## 2024-06-22 ENCOUNTER — Other Ambulatory Visit: Payer: Self-pay

## 2024-06-22 ENCOUNTER — Emergency Department (HOSPITAL_COMMUNITY)
Admission: EM | Admit: 2024-06-22 | Discharge: 2024-06-23 | Disposition: A | Discharge status: Home / Self Care | Attending: Emergency Medicine | Admitting: Emergency Medicine

## 2024-06-22 DIAGNOSIS — Z7982 Long term (current) use of aspirin: Secondary | ICD-10-CM | POA: Diagnosis not present

## 2024-06-22 DIAGNOSIS — R7401 Elevation of levels of liver transaminase levels: Secondary | ICD-10-CM | POA: Diagnosis not present

## 2024-06-22 DIAGNOSIS — J101 Influenza due to other identified influenza virus with other respiratory manifestations: Secondary | ICD-10-CM | POA: Insufficient documentation

## 2024-06-22 DIAGNOSIS — Z951 Presence of aortocoronary bypass graft: Secondary | ICD-10-CM | POA: Insufficient documentation

## 2024-06-22 DIAGNOSIS — I2510 Atherosclerotic heart disease of native coronary artery without angina pectoris: Secondary | ICD-10-CM | POA: Insufficient documentation

## 2024-06-22 DIAGNOSIS — Z8673 Personal history of transient ischemic attack (TIA), and cerebral infarction without residual deficits: Secondary | ICD-10-CM | POA: Diagnosis not present

## 2024-06-22 DIAGNOSIS — J189 Pneumonia, unspecified organism: Secondary | ICD-10-CM | POA: Diagnosis not present

## 2024-06-22 DIAGNOSIS — R42 Dizziness and giddiness: Secondary | ICD-10-CM | POA: Diagnosis present

## 2024-06-22 LAB — URINALYSIS, ROUTINE W REFLEX MICROSCOPIC
Bilirubin Urine: NEGATIVE
Glucose, UA: NEGATIVE mg/dL
Ketones, ur: 5 mg/dL — AB
Leukocytes,Ua: NEGATIVE
Nitrite: NEGATIVE
Protein, ur: NEGATIVE mg/dL
Specific Gravity, Urine: 1.019 (ref 1.005–1.030)
pH: 5 (ref 5.0–8.0)

## 2024-06-22 LAB — COMPREHENSIVE METABOLIC PANEL WITH GFR
ALT: 28 U/L (ref 0–44)
AST: 49 U/L — ABNORMAL HIGH (ref 15–41)
Albumin: 4.2 g/dL (ref 3.5–5.0)
Alkaline Phosphatase: 69 U/L (ref 38–126)
Anion gap: 13 (ref 5–15)
BUN: 14 mg/dL (ref 8–23)
CO2: 21 mmol/L — ABNORMAL LOW (ref 22–32)
Calcium: 9.2 mg/dL (ref 8.9–10.3)
Chloride: 105 mmol/L (ref 98–111)
Creatinine, Ser: 0.94 mg/dL (ref 0.61–1.24)
GFR, Estimated: >60 mL/min
Glucose, Bld: 111 mg/dL — ABNORMAL HIGH (ref 70–99)
Potassium: 4.1 mmol/L (ref 3.5–5.1)
Sodium: 139 mmol/L (ref 135–145)
Total Bilirubin: 0.8 mg/dL (ref 0.0–1.2)
Total Protein: 6.5 g/dL (ref 6.5–8.1)

## 2024-06-22 LAB — CBG MONITORING, ED: Glucose-Capillary: 121 mg/dL — ABNORMAL HIGH (ref 70–99)

## 2024-06-22 LAB — CBC
HCT: 42.8 % (ref 39.0–52.0)
Hemoglobin: 14.6 g/dL (ref 13.0–17.0)
MCH: 32.6 pg (ref 26.0–34.0)
MCHC: 34.1 g/dL (ref 30.0–36.0)
MCV: 95.5 fL (ref 80.0–100.0)
Platelets: 163 K/uL (ref 150–400)
RBC: 4.48 MIL/uL (ref 4.22–5.81)
RDW: 12 % (ref 11.5–15.5)
WBC: 9.3 K/uL (ref 4.0–10.5)
nRBC: 0 % (ref 0.0–0.2)

## 2024-06-22 LAB — RESP PANEL BY RT-PCR (RSV, FLU A&B, COVID)  RVPGX2
Influenza A by PCR: POSITIVE — AB
Influenza B by PCR: NEGATIVE
Resp Syncytial Virus by PCR: NEGATIVE
SARS Coronavirus 2 by RT PCR: NEGATIVE

## 2024-06-22 LAB — TROPONIN T, HIGH SENSITIVITY
Troponin T High Sensitivity: 32 ng/L — ABNORMAL HIGH (ref 0–19)
Troponin T High Sensitivity: 38 ng/L — ABNORMAL HIGH (ref 0–19)

## 2024-06-22 LAB — I-STAT CG4 LACTIC ACID, ED: Lactic Acid, Venous: 1.7 mmol/L (ref 0.5–1.9)

## 2024-06-22 MED ORDER — ACETAMINOPHEN 325 MG PO TABS
650.0000 mg | ORAL_TABLET | Freq: Once | ORAL | Status: AC
  Administered 2024-06-22: 650 mg via ORAL
  Filled 2024-06-22: qty 2

## 2024-06-22 MED ORDER — SODIUM CHLORIDE 0.9 % IV BOLUS
1000.0000 mL | Freq: Once | INTRAVENOUS | Status: AC
  Administered 2024-06-22: 1000 mL via INTRAVENOUS

## 2024-06-22 MED ORDER — IOHEXOL 350 MG/ML SOLN
75.0000 mL | Freq: Once | INTRAVENOUS | Status: AC | PRN
  Administered 2024-06-22: 75 mL via INTRAVENOUS

## 2024-06-22 MED ORDER — OSELTAMIVIR PHOSPHATE 75 MG PO CAPS
75.0000 mg | ORAL_CAPSULE | Freq: Once | ORAL | Status: AC
  Administered 2024-06-22: 75 mg via ORAL
  Filled 2024-06-22: qty 1

## 2024-06-22 NOTE — ED Provider Triage Note (Signed)
 Emergency Medicine Provider Triage Evaluation Note  KARRY CAUSER , a 84 y.o. male  was evaluated in triage.  Pt complains of dizziness, intermittent confusion, overall not feeling well  Review of Systems  Positive: Dizziness, confusion, overall not feeling well Negative: Nausea, vomiting, cough, congestion, chest pain, shortness of breath  Physical Exam  BP 127/74 (BP Location: Left Arm)   Pulse (!) 115   Temp (!) 100.6 F (38.1 C) (Temporal)   Resp 16   Ht 5' 6 (1.676 m)   Wt 68.9 kg   SpO2 96%   BMI 24.53 kg/m  Gen:   Awake, no distress   Resp:  Normal effort  MSK:   Moves extremities without difficulty  Other:  Febrile and tachycardic in triage  Medical Decision Making  Medically screening exam initiated at 5:12 PM.  Appropriate orders placed.  INDIANA PECHACEK was informed that the remainder of the evaluation will be completed by another provider, this initial triage assessment does not replace that evaluation, and the importance of remaining in the ED until their evaluation is complete.  Labs and imaging ordered   Francis Ileana LOISE DEVONNA 06/22/24 1714

## 2024-06-22 NOTE — ED Provider Notes (Signed)
 " [place] [facility] Provider Note   CSN: [id number] Arrival date & time: 06/22/24  1613     Patient presents with: Dizziness   Keith Cunningham is a 84 y.o. male with medical history to include CABG 2015, MI 2013, anemia, CAD, hard of hearing, iron deficiency anemia, essential tremor, vertigo.  Patient presents to ED complaining of intermittent confusion, dizziness, staring off.  Patient reports that earlier today he was playing golf with friends.  Reports that he had gotten through about 4 or 5 holes when he began to experience dizziness which he states is chronic for him due to his vertigo.  Reports that he was able to finish the rest of the front 9, decided to stop playing after this and went to his car while his friends continue to play golf.  Reports that he got to his car and then froze for about 1 hour or 2 hours.  Patient goes on to report that he was simply standing at his trunk with his hand on the trunk but was unable to move.  He denies a history of this.  He reports his friends finished the back 9 and then noticed patient was still there.  Patient friends then called EMS and patient was transported to ED.  Patient reports he remembers the entire event.  States that he was just locked in and unable to move.  Denies any one-sided weakness or numbness during this event.  Denies chest pain, shortness of breath.  Denies any fevers, nausea or vomiting at home.  Denies headache, neck pain, blurred vision.  Patient is ANO x 4 at this time.  Denies blood thinners.  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   Dizziness      Prior to Admission medications  Medication Sig Start Date End Date Taking? Authorizing Provider  amoxicillin -clavulanate (AUGMENTIN ) 875-125 MG tablet Take 1 tablet by mouth every 12 (twelve) hours. 06/23/24  Yes Ruthell Lonni FALCON, PA-C  azithromycin  (ZITHROMAX ) 250 MG tablet Take 2 tablets (500 mg total) by mouth daily for 1 day, THEN 1 tablet  (250 mg total) daily for 4 days. Take first 2 tablets together, then 1 every day until finished. 06/23/24 06/28/24 Yes Ruthell Lonni FALCON, PA-C  oseltamivir  (TAMIFLU ) 75 MG capsule Take 1 capsule (75 mg total) by mouth every 12 (twelve) hours. 06/23/24  Yes Ruthell Lonni FALCON, PA-C  acetaminophen  (TYLENOL ) 500 MG tablet Take 500 mg by mouth every 6 (six) hours as needed for moderate pain.    [provider]  aspirin  81 MG EC tablet Take 1 tablet by mouth daily.     [provider]  Coenzyme Q10 (COQ10) 200 MG CAPS Take 200 mg by mouth daily.    [provider]  Omega-3 Fatty Acids (FISH OIL) 500 MG CAPS Take 500 mg by mouth in the morning and at bedtime.    [provider]  rosuvastatin  (CRESTOR ) 5 MG tablet Take 1 tablet by mouth once daily 10/20/23   Jeffrie Oneil BROCKS, MD    Allergies: Atorvastatin , Doxycycline , Statins, and Codeine    Review of Systems  Neurological:  Positive for dizziness.  All other systems reviewed and are negative.   Updated Vital Signs BP (!) 125/97 (BP Location: Left Arm)   Pulse 93   Temp 98 F (36.7 C) (Oral)   Resp 18   Ht 5' 6 (1.676 m)   Wt 68.9 kg   SpO2 95%   BMI 24.53 kg/m  Physical Exam Vitals and nursing note reviewed.  Constitutional:      General: He is not in acute distress.    Appearance: He is well-developed.  HENT:     Head: Normocephalic and atraumatic.  Eyes:     Conjunctiva/sclera: Conjunctivae normal.  Cardiovascular:     Rate and Rhythm: Normal rate and regular rhythm.     Heart sounds: No murmur heard. Pulmonary:     Effort: Pulmonary effort is normal. No respiratory distress.     Breath sounds: Normal breath sounds.  Abdominal:     Palpations: Abdomen is soft.     Tenderness: There is no abdominal tenderness.  Musculoskeletal:        General: No swelling.     Cervical back: Neck supple.  Skin:    General: Skin is warm and dry.     Capillary Refill: Capillary refill takes less than 2  seconds.  Neurological:     General: No focal deficit present.     Mental Status: He is alert and oriented to person, place, and time. Mental status is at baseline.     GCS: GCS eye subscore is 4. GCS verbal subscore is 5. GCS motor subscore is 6.     Cranial Nerves: Cranial nerves 2-12 are intact. No cranial nerve deficit.     Sensory: Sensation is intact. No sensory deficit.     Motor: Motor function is intact. No weakness.     Comments: CN III through XII are intact.  Intact finger-to-nose, heel-to-shin.  No pronator drift.  No slurred speech.  Equal strength throughout.  Equal sensation throughout.  PERRL.  Tracks cross midline.  Alert and oriented x 4.  Ambulates steady gait.  Psychiatric:        Mood and Affect: Mood normal.     (all labs ordered are listed, but only abnormal results are displayed) Labs Reviewed  RESP PANEL BY RT-PCR (RSV, FLU A&B, COVID)  RVPGX2 - Abnormal; Notable for the following components:      Result Value   Influenza A by PCR POSITIVE (*)    All other components within normal limits  COMPREHENSIVE METABOLIC PANEL WITH GFR - Abnormal; Notable for the following components:   CO2 21 (*)    Glucose, Bld 111 (*)    AST 49 (*)    All other components within normal limits  URINALYSIS, ROUTINE W REFLEX MICROSCOPIC - Abnormal; Notable for the following components:   Hgb urine dipstick SMALL (*)    Ketones, ur 5 (*)    Bacteria, UA RARE (*)    All other components within normal limits  CBG MONITORING, ED - Abnormal; Notable for the following components:   Glucose-Capillary 121 (*)    All other components within normal limits  TROPONIN T, HIGH SENSITIVITY - Abnormal; Notable for the following components:   Troponin T High Sensitivity 32 (*)    All other components within normal limits  TROPONIN T, HIGH SENSITIVITY - Abnormal; Notable for the following components:   Troponin T High Sensitivity 38 (*)    All other components within normal limits  CBC  I-STAT  CG4 LACTIC ACID, ED    EKG: EKG Interpretation Date/Time:  Wednesday June 22 2024 17:02:51 EST Ventricular Rate:  115 PR Interval:  182 QRS Duration:  82 QT Interval:  308 QTC Calculation: 426 R Axis:   -60  Text Interpretation: Sinus tachycardia Left anterior fascicular block Cannot rule out Anterior infarct , age undetermined Abnormal ECG Since last  tracing rate faster Otherwise no significant change Confirmed by Emil Share [phone]) on 06/22/2024 11:00:59 PM  Radiology: MR Brain W and Wo Contrast Result Date: 06/23/2024 EXAM: MRI BRAIN WITH CONTRAST 06/23/2024 12:55:00 AM TECHNIQUE: Multiplanar multisequence MRI of the head/brain was performed with the administration of intravenous contrast. COMPARISON: MRI head 02/03/2019 CLINICAL HISTORY: Transient ischemic attack (TIA) FINDINGS: BRAIN AND VENTRICLES: No acute infarct. No acute intracranial hemorrhage. No mass effect or midline shift. No hydrocephalus. Mild to moderate scattered T2 hyperintensities in the white matter, compatible with chronic microvascular ischemic change. Normal flow voids. No mass or abnormal enhancement. ORBITS: No acute abnormality. SINUSES: No acute abnormality. BONES AND SOFT TISSUES: Normal bone marrow signal and enhancement. No acute soft tissue abnormality. IMPRESSION: 1. No acute intracranial abnormality. 2. No mass or abnormal enhancement. Electronically signed by: Gilmore Molt MD 06/23/2024 01:12 AM EST RP Workstation: [id number]   CT ANGIO HEAD NECK W WO CM Result Date: 06/23/2024 EXAM: CTA Head and Neck with Intravenous Contrast. CT Head without Contrast. CLINICAL HISTORY: Neuro deficit, acute, stroke suspected TECHNIQUE: Axial CTA images of the head and neck performed with intravenous contrast. MIP reconstructed images were created and reviewed. Axial computed tomography images of the head/brain performed without intravenous contrast. Note: Per PQRS, the description of internal carotid artery percent stenosis,  including 0 percent or normal exam, is based on [employer] American Symptomatic Carotid Endarterectomy Trial (NASCET) criteria. Dose reduction technique was used including one or more of the following: automated exposure control, adjustment of mA and kV according to patient size, and/or iterative reconstruction. CONTRAST: 75 mL Omnipaque  COMPARISON: MRI head 02/03/2019 FINDINGS: CT HEAD: BRAIN: No acute intraparenchymal hemorrhage. No mass lesion. No CT evidence for acute territorial infarct. No midline shift or extra-axial collection. Moderate patchy white matter hypodensities, compatible with chronic microvascular ischemic change. VENTRICLES: No hydrocephalus. ORBITS: The orbits are unremarkable. SINUSES AND MASTOIDS: The paranasal sinuses and mastoid air cells are clear. CTA NECK: COMMON CAROTID ARTERIES: No significant stenosis. No dissection or occlusion. INTERNAL CAROTID ARTERIES: No stenosis by NASCET criteria. No dissection or occlusion. VERTEBRAL ARTERIES: Moderate left intraarticular vertebral artery stenosis. No dissection or occlusion. CTA HEAD: ANTERIOR CEREBRAL ARTERIES: No significant stenosis. No occlusion. No aneurysm. MIDDLE CEREBRAL ARTERIES: No significant stenosis. No occlusion. No aneurysm. POSTERIOR CEREBRAL ARTERIES: No significant stenosis. No occlusion. No aneurysm. BASILAR ARTERY: No significant stenosis. No occlusion. No aneurysm. OTHER: SOFT TISSUES: No acute finding. No masses or lymphadenopathy. BONES: No acute osseous abnormality. IMPRESSION: 1. No acute intracranial abnormality. 2. No large vessel occlusion. 3. Moderate left intradural vertebral artery stenosis. Electronically signed by: Gilmore Molt MD 06/23/2024 12:21 AM EST RP Workstation: [id number]   DG Chest 2 View Result Date: 06/22/2024 CLINICAL DATA:  Dizziness EXAM: CHEST - 2 VIEW COMPARISON:  06/17/2018 FINDINGS: Sternotomy. Normal cardiac size with aortic atherosclerosis. Hazy left CP angle, possible small volume pleural  fluid or focal airspace disease. IMPRESSION: Hazy left CP angle, possible small volume pleural fluid or focal airspace disease. Electronically Signed   By: Luke Bun M.D.   On: 06/22/2024 18:40    Procedures   Medications Ordered in the ED  acetaminophen  (TYLENOL ) tablet 650 mg (650 mg Oral Given 06/22/24 1710)  sodium chloride  0.9 % bolus 1,000 mL (0 mLs Intravenous Stopped 06/22/24 2340)  oseltamivir  (TAMIFLU ) capsule 75 mg (75 mg Oral Given 06/22/24 2345)  iohexol  (OMNIPAQUE ) 350 MG/ML injection 75 mL (75 mLs Intravenous Contrast Given 06/22/24 2336)  gadobutrol  (GADAVIST ) 1 MMOL/ML injection 7 mL (7 mLs  Intravenous Contrast Given 06/23/24 0044)  ondansetron  (ZOFRAN ) injection 4 mg (4 mg Intravenous Given 06/23/24 0205)    Clinical Course as of 06/23/24 0254  Wed Jun 22, 2024  2307 Cta head neck, mri brain per arora md [CG]    Clinical Course User Index [CG] Ruthell Lonni FALCON, PA-C    Medical Decision Making Amount and/or Complexity of Data Reviewed Labs: ordered. Radiology: ordered.  Risk Prescription drug management.   84 year old male presenting for evaluation.  On exam, HD stable.  Lung sounds clear bilaterally, no hypoxia.  Abdomen soft and compressible.  Neurological examination at baseline without focal neurodeficits.  Overall patient nontoxic in appearance.  Patient seen in triage where labs were ordered to include CBC CMP lactic acid troponin urinalysis viral panel chest x-ray, EKG.  CBC without leukocytosis or anemia.  Metabolic panel is grossly unremarkable.  AST slightly elevated at 49 with no abdominal pain.  Anion gap 13.  Lactic acid 1.7.  Troponins are flat 32, 38, patient denies chest pain.  EKG nonischemic and shows sinus tachycardia which is normalized without intervention.  Patient arrived febrile, most likely tachycardia due to viral illness.  Urinalysis with rare bacteria, small hemoglobin but patient denies dysuria.  Viral panel positive for influenza A,  given Tamiflu .  Patient chest x-ray reveals concern for community-acquired pneumonia.  Will start on doxycycline .  Consulted with neurologist, Dr. Arora.  He recommends CTA head and neck, MRI brain for further investigation of patient locking up earlier.  MRI, CTA obtained.  MRI unremarkable.  CTA unremarkable.  At this time patient reports that he feels back to baseline.  Patient will be discharged home at this time, he will follow-up outpatient with neurology.  He had all questions answered to his satisfaction.  He is stable to discharge home.    Final diagnoses:  Dizziness  Community acquired pneumonia, unspecified laterality    ED Discharge Orders          Ordered    amoxicillin -clavulanate (AUGMENTIN ) 875-125 MG tablet  Every 12 hours        06/23/24 0253    azithromycin  (ZITHROMAX ) 250 MG tablet  Daily        06/23/24 0253    oseltamivir  (TAMIFLU ) 75 MG capsule  Every 12 hours        06/23/24 0253               Ruthell Lonni FALCON, PA-C 06/23/24 0254    Floyd, Dan, DO 06/23/24 0300  "

## 2024-06-22 NOTE — ED Triage Notes (Signed)
 Patient arrives via [facility] for dizziness from [place]. Patient was playing golf with sudden onset dizziness. Patient was ambulatory back to car. Patient was still sitting in vehicle after friends got done playing. HR 130, intermittent confusion. Patient given 1L fluids. Dizziness has decreased and patients mentation has improved. Hx of vertigo .  HR 110 BP 130/76 RR 20 CBG 138  29 L FA

## 2024-06-22 NOTE — ED Provider Notes (Incomplete)
 " [place] [facility] Provider Note   CSN: [id number] Arrival date & time: 06/22/24  1613     Patient presents with: Dizziness   Keith Cunningham is a 84 y.o. male with medical history to include CABG 2015, MI 2013, anemia, CAD, hard of hearing, iron deficiency anemia, essential tremor, vertigo.  Patient presents to ED complaining of intermittent confusion, dizziness, staring off.  Patient reports that earlier today he was playing golf with friends.  Reports that he had gotten through about 4 or 5 holes when he began to experience dizziness which he states is chronic for him due to his vertigo.  Reports that he was able to finish the rest of the front 9, decided to stop playing after this and went to his car while his friends continue to play golf.  Reports that he got to his car and then froze for about 1 hour or 2 hours.  Patient goes on to report that he was simply standing at his trunk with his hand on the trunk but was unable to move.  He denies a history of this.  He reports his friends finished the back 9 and then noticed patient was still there.  Patient friends then called EMS and patient was transported to ED.  Patient reports he remembers the entire event.  States that he was just locked in and unable to move.  Denies any one-sided weakness or numbness during this event.  Denies chest pain, shortness of breath.  Denies any fevers, nausea or vomiting at home.  Denies headache, neck pain, blurred vision.  Patient is ANO x 4 at this time.  Denies blood thinners.  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   Dizziness      Prior to Admission medications  Medication Sig Start Date End Date Taking? Authorizing Provider  acetaminophen  (TYLENOL ) 500 MG tablet Take 500 mg by mouth every 6 (six) hours as needed for moderate pain.    [provider]  aspirin  81 MG EC tablet Take 1 tablet by mouth daily.     [provider]  Coenzyme Q10 (COQ10) 200 MG CAPS  Take 200 mg by mouth daily.    [provider]  Omega-3 Fatty Acids (FISH OIL) 500 MG CAPS Take 500 mg by mouth in the morning and at bedtime.    [provider]  rosuvastatin  (CRESTOR ) 5 MG tablet Take 1 tablet by mouth once daily 10/20/23   Jeffrie Oneil BROCKS, MD    Allergies: Atorvastatin , Doxycycline , Statins, and Codeine    Review of Systems  Neurological:  Positive for dizziness.    Updated Vital Signs BP 118/73 (BP Location: Left Arm)   Pulse (!) 104   Temp 99 F (37.2 C) (Oral)   Resp 16   Ht 5' 6 (1.676 m)   Wt 68.9 kg   SpO2 95%   BMI 24.53 kg/m   Physical Exam  (all labs ordered are listed, but only abnormal results are displayed) Labs Reviewed  RESP PANEL BY RT-PCR (RSV, FLU A&B, COVID)  RVPGX2 - Abnormal; Notable for the following components:      Result Value   Influenza A by PCR POSITIVE (*)    All other components within normal limits  COMPREHENSIVE METABOLIC PANEL WITH GFR - Abnormal; Notable for the following components:   CO2 21 (*)    Glucose, Bld 111 (*)    AST 49 (*)    All other components within normal limits  URINALYSIS,  ROUTINE W REFLEX MICROSCOPIC - Abnormal; Notable for the following components:   Hgb urine dipstick SMALL (*)    Ketones, ur 5 (*)    Bacteria, UA RARE (*)    All other components within normal limits  CBG MONITORING, ED - Abnormal; Notable for the following components:   Glucose-Capillary 121 (*)    All other components within normal limits  TROPONIN T, HIGH SENSITIVITY - Abnormal; Notable for the following components:   Troponin T High Sensitivity 32 (*)    All other components within normal limits  TROPONIN T, HIGH SENSITIVITY - Abnormal; Notable for the following components:   Troponin T High Sensitivity 38 (*)    All other components within normal limits  CBC  I-STAT CG4 LACTIC ACID, ED    EKG: None  Radiology: DG Chest 2 View Result Date: 06/22/2024 CLINICAL DATA:  Dizziness EXAM: CHEST - 2 VIEW  COMPARISON:  06/17/2018 FINDINGS: Sternotomy. Normal cardiac size with aortic atherosclerosis. Hazy left CP angle, possible small volume pleural fluid or focal airspace disease. IMPRESSION: Hazy left CP angle, possible small volume pleural fluid or focal airspace disease. Electronically Signed   By: Luke Bun M.D.   On: 06/22/2024 18:40    {Document cardiac monitor, telemetry assessment procedure when appropriate:32947} Procedures   Medications Ordered in the ED  acetaminophen  (TYLENOL ) tablet 650 mg (650 mg Oral Given 06/22/24 1710)      {Click here for ABCD2, HEART and other calculators REFRESH Note before signing:1}                              Medical Decision Making  ***  {Document critical care time when appropriate  Document review of labs and clinical decision tools ie [id number], etc  Document your independent review of radiology images and any outside records  Document your discussion with family members, caretakers and with consultants  Document social determinants of health affecting pt's care  Document your decision making why or why not admission, treatments were needed:32947:::1}   Final diagnoses:  None    ED Discharge Orders     None        "

## 2024-06-22 NOTE — ED Notes (Signed)
 Patient transported to X-ray

## 2024-06-23 ENCOUNTER — Telehealth: Payer: Self-pay | Admitting: Neurology

## 2024-06-23 ENCOUNTER — Ambulatory Visit

## 2024-06-23 ENCOUNTER — Emergency Department (HOSPITAL_COMMUNITY)

## 2024-06-23 VITALS — Wt 155.0 lb

## 2024-06-23 DIAGNOSIS — Z0000 Encounter for general adult medical examination without abnormal findings: Secondary | ICD-10-CM

## 2024-06-23 MED ORDER — OSELTAMIVIR PHOSPHATE 75 MG PO CAPS: 75.0000 mg | ORAL_CAPSULE | Freq: Two times a day (BID) | ORAL | 0 refills | Status: DC

## 2024-06-23 MED ORDER — AMOXICILLIN-POT CLAVULANATE 875-125 MG PO TABS: 1.0000 | ORAL_TABLET | Freq: Two times a day (BID) | ORAL | 0 refills | Status: DC

## 2024-06-23 MED ORDER — GADOBUTROL 1 MMOL/ML IV SOLN
7.0000 mL | Freq: Once | INTRAVENOUS | Status: AC | PRN
  Administered 2024-06-23: 7 mL via INTRAVENOUS

## 2024-06-23 MED ORDER — AZITHROMYCIN 250 MG PO TABS: ORAL_TABLET | ORAL | 0 refills | Status: AC

## 2024-06-23 MED ORDER — ONDANSETRON HCL 4 MG/2ML IJ SOLN
4.0000 mg | Freq: Once | INTRAMUSCULAR | Status: AC
  Administered 2024-06-23: 4 mg via INTRAVENOUS
  Filled 2024-06-23: qty 2

## 2024-06-23 NOTE — Discharge Instructions (Signed)
 As discussed, your chest x-ray did reveal that you have a small area concerning for pneumonia.  Please begin taking Augmentin  twice a day for 7 days.  Please begin taking azithromycin  500 mg day 1 followed by 2 and 50 mg on days 2, 3, 4 and 5.  Please also begin taking Tamiflu  75 mg twice a day for 5 days.  The imaging of your head, the MRI, was unremarkable.  Please follow-up outpatient with your neurologist.  Return to the ED with new symptoms.

## 2024-06-23 NOTE — Progress Notes (Addendum)
 "  Chief Complaint  Patient presents with   Medicare Wellness    SUBSEQUENT     Subjective:   Keith Cunningham is a 84 y.o. male who presents for a Medicare Annual Wellness Visit.  Visit info / Clinical Intake: Medicare Wellness Visit Type:: Subsequent Annual Wellness Visit Persons participating in visit and providing information:: patient & caregiver Medicare Wellness Visit Mode:: Telephone If telephone:: video error Since this visit was completed virtually, some vitals may be partially provided or unavailable. Missing vitals are due to the limitations of the virtual format.: Documented vitals are patient reported If Telephone or Video please confirm:: I connected with patient using audio/video enable telemedicine. I verified patient identity with two identifiers, discussed telehealth limitations, and patient agreed to proceed. Patient Location:: Home Provider Location:: Home Office (Remote) Interpreter Needed?: No Pre-visit prep was completed: yes AWV questionnaire completed by patient prior to visit?: no Living arrangements:: lives with spouse/significant other Patient's Overall Health Status Rating: good Typical amount of pain: none Does pain affect daily life?: no Are you currently prescribed opioids?: no  Dietary Habits and Nutritional Risks How many meals a day?: 3 Eats fruit and vegetables daily?: yes Most meals are obtained by: preparing own meals (wife prepares meals) In the last 2 weeks, have you had any of the following?: none Diabetic:: no  Functional Status Activities of Daily Living (to include ambulation/medication): Independent Ambulation: Independent with device- listed below Home Assistive Devices/Equipment: Eyeglasses; Shower/tub chair Medication Administration: Independent Home Management (perform basic housework or laundry): Independent Manage your own finances?: yes Primary transportation is: driving Concerns about vision?: no *vision screening is  required for [facility]* (Wears eyeglasses) Concerns about hearing?: (!) yes (Progressive hearing loss) Uses hearing aids?: no  Fall Screening Falls in the past year?: 0 Number of falls in past year: 0 Was there an injury with Fall?: 0 Fall Risk Category Calculator: 0 Patient Fall Risk Level: Low Fall Risk  Fall Risk Patient at Risk for Falls Due to: No Fall Risks Fall risk Follow up: Falls evaluation completed; Education provided  Home and Transportation Safety: All rugs have non-skid backing?: N/A, no rugs All stairs or steps have railings?: N/A, no stairs Grab bars in the bathtub or shower?: (!) no Have non-skid surface in bathtub or shower?: yes Good home lighting?: yes Regular seat belt use?: yes Hospital stays in the last year:: no  Cognitive Assessment Difficulty concentrating, remembering, or making decisions? : no Will 6CIT or Mini Cog be Completed: yes What year is it?: 0 points What month is it?: 0 points Give patient an address phrase to remember (5 components): Will [employer] [address] About what time is it?: 0 points Count backwards from 20 to 1: 0 points Say the months of the year in reverse: 0 points Repeat the address phrase from earlier: 0 points 6 CIT Score: 0 points  Advance Directives (For Healthcare) Does Patient Have a Medical Advance Directive?: Yes Does patient want to make changes to medical advance directive?: No - Patient declined Type of Advance Directive: Healthcare Power of [place]; Living will Copy of Healthcare Power of Attorney in Chart?: Yes - validated most recent copy scanned in chart (See row information) Copy of Living Will in Chart?: Yes - validated most recent copy scanned in chart (See row information) Would patient like information on creating a medical advance directive?: No - Patient declined  Reviewed/Updated  Reviewed/Updated: Reviewed All (Medical, Surgical, Family, Medications, Allergies, Care Teams, Patient  Goals)  Allergies (verified) Atorvastatin , Doxycycline , Statins, and Codeine   Current Medications (verified) Outpatient Encounter Medications as of 06/23/2024  Medication Sig   acetaminophen  (TYLENOL ) 500 MG tablet Take 500 mg by mouth every 6 (six) hours as needed for moderate pain.   Coenzyme Q10 (COQ10) 200 MG CAPS Take 200 mg by mouth daily.   Omega-3 Fatty Acids (FISH OIL) 500 MG CAPS Take 500 mg by mouth in the morning and at bedtime.   rosuvastatin  (CRESTOR ) 5 MG tablet Take 1 tablet by mouth once daily   amoxicillin -clavulanate (AUGMENTIN ) 875-125 MG tablet Take 1 tablet by mouth every 12 (twelve) hours. (Patient not taking: Reported on 06/23/2024)   aspirin  81 MG EC tablet Take 1 tablet by mouth daily.  (Patient not taking: Reported on 06/23/2024)   azithromycin  (ZITHROMAX ) 250 MG tablet Take 2 tablets (500 mg total) by mouth daily for 1 day, THEN 1 tablet (250 mg total) daily for 4 days. Take first 2 tablets together, then 1 every day until finished. (Patient not taking: No sig reported)   oseltamivir  (TAMIFLU ) 75 MG capsule Take 1 capsule (75 mg total) by mouth every 12 (twelve) hours. (Patient not taking: Reported on 06/23/2024)   No facility-administered encounter medications on file as of 06/23/2024.    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    Staph infection 03/2017   had  I and D of infected abscess    Past Surgical History:  Procedure Laterality Date   CARDIAC CATHETERIZATION  04/21/2012   CATARACT EXTRACTION, BILATERAL Bilateral    CHOLECYSTECTOMY N/A 05/23/2021   Procedure: LAPAROSCOPIC CHOLECYSTECTOMY;  Surgeon: Dasie Leonor CROME, MD;  Location[facility] OR;  Service: General;  Laterality: N/A;   CORONARY ARTERY BYPASS GRAFT  05/10/2012   Procedure: CORONARY ARTERY BYPASS GRAFTING (CABG);  Surgeon: Dallas KATHEE Jude, MD;  Location[facility] OR;  Service: Open Heart Surgery;  Laterality: N/A;  times three using Left Greater Saphenous Vein Graft harvested endoscopically and Left Internal Mammary Artery   EXCISIONAL HEMORRHOIDECTOMY  2012   tried to shrink 1st w/needle; cut them out 2 wk later (05/06/2012)   EYE SURGERY     INCISION AND DRAINAGE ABSCESS Right 03/25/2017   Procedure: INCISION AND DRAINAGE ABSCESS;  Surgeon: Curvin Deward MOULD, MD;  Location: [facility] ORS;  Service: General;  Laterality: Right;   INTRAOPERATIVE TRANSESOPHAGEAL ECHOCARDIOGRAM  05/10/2012   Procedure: INTRAOPERATIVE TRANSESOPHAGEAL ECHOCARDIOGRAM;  Surgeon: Dallas KATHEE Jude, MD;  Location: [facility] OR;  Service: Open Heart Surgery;  Laterality: N/A;   MELANOMA EXCISION  2009   left side of head (05/06/2012)   TONSILLECTOMY AND ADENOIDECTOMY  ~ 1950   TOTAL HIP ARTHROPLASTY Left 05/26/2016   Procedure: TOTAL HIP ARTHROPLASTY ANTERIOR APPROACH;  Surgeon: Norleen Gavel, MD;  Location: [facility] OR;  Service: Orthopedics;  Laterality: Left;   TOTAL HIP ARTHROPLASTY Right 06/25/2018   Procedure: TOTAL HIP ARTHROPLASTY ANTERIOR APPROACH;  Surgeon: Yvone Rush, MD;  Location: [facility] ORS;  Service: Orthopedics;  Laterality: Right;   Family History  Problem Relation Age of Onset   Diabetes Mellitus II Mother    Heart disease Mother    Stroke Mother    Heart attack Father    Diabetes Sister    Alcohol abuse Sister    Neuropathy Neg Hx    Social History   Occupational History   Occupation: retired    Comment:  airline pilot  Tobacco Use   Smoking status: Never   Smokeless tobacco: Never  Vaping Use   Vaping status: Never Used  Substance and Sexual Activity   Alcohol use: No   Drug use: No   Sexual activity: Yes   Tobacco Counseling Counseling given: Not Answered  SDOH Screenings   Food Insecurity: No Food Insecurity (06/23/2024)  Housing: Low Risk (06/23/2024)  Transportation Needs: No Transportation Needs (06/23/2024)  Utilities: Not At Risk (06/23/2024)  Alcohol Screen: Low Risk (12/16/2022)  Depression (PHQ2-9): Low Risk (06/23/2024)  Financial Resource Strain: Low Risk (12/16/2022)  Physical Activity: Sufficiently Active (06/23/2024)  Social Connections: Moderately Isolated (06/23/2024)  Stress: No Stress Concern Present (06/23/2024)  Tobacco Use: Low Risk (06/23/2024)  Health Literacy: Adequate Health Literacy (06/23/2024)   See flowsheets for full screening details  Depression Screen PHQ 2 & 9 Depression Scale- Over the past 2 weeks, how often have you been bothered by any of the following problems? Little interest or pleasure in doing things: 0 Feeling down, depressed, or hopeless (PHQ Adolescent also includes...irritable): 0 PHQ-2 Total Score: 0 Trouble falling or staying asleep, or sleeping too much: 0 Feeling tired or having little energy: 0 Poor appetite or overeating (PHQ Adolescent also includes...weight loss): 0 Feeling bad about yourself - or that you are a failure or have let yourself or your family down: 0 Trouble concentrating on things, such as reading the newspaper or watching television (PHQ Adolescent also includes...like school work): 0 Moving or speaking so slowly that other people could have noticed. Or the opposite - being so fidgety or restless that you have been moving around a lot more than usual: 0 Thoughts that you would be better off dead, or of hurting yourself in some way: 0 PHQ-9 Total Score: 0 If you checked off any problems, how difficult have these problems made it for you  to do your work, take care of things at home, or get along with other people?: Not difficult at all  Depression Treatment Depression Interventions/Treatment : [id number] Score <4 Follow-up Not Indicated     Goals Addressed             This Visit's Progress    06/23/2024: My goal for 2026 is to keep moving and stay alive.               Objective:    Today's Vitals   06/23/24 0956  Weight: 155 lb (70.3 kg)  PainSc: 0-No pain   Body mass index is 25.02 kg/m.  Hearing/Vision screen Hearing Screening - Comments:: Deaf in right ear. Some hearing loss in left ear. No hearing aids. Vision Screening - Comments:: Patient wears eyeglasses.  Eye exam done by Dr. Selinda Reusing. Immunizations and Health Maintenance Health Maintenance  Topic Date Due   Pneumococcal Vaccine: 50+ Years (1 of 2 - PCV) Never done   Zoster Vaccines- Shingrix (1 of 2)  Never done   Influenza Vaccine  Never done   COVID-19 Vaccine (5 - 2025-26 season) 02/15/2024   Medicare Annual Wellness (AWV)  06/23/2025   DTaP/Tdap/Td (2 - Td or Tdap) 09/11/2030   Meningococcal B Vaccine  Aged Out   Hepatitis C Screening  Discontinued        Assessment/Plan:  This is a routine wellness examination for Benn[place].  Patient Care Team: Howell Lunger, DO as PCP - General (Family Medicine) Jeffrie Oneil BROCKS, MD as PCP - Cardiology (Cardiology) Robinson Mayo, OD as Referring Physician (Optometry) Joshua Blamer, MD as Attending Physician (Dermatology) Magdalen Pasco RAMAN, DPM as Consulting Physician (Podiatry) Cesario Boer, MD as Attending Physician (Physical Medicine and Rehabilitation)  I have personally reviewed and noted the following in the patients chart:   Medical and social history Use of alcohol, tobacco or illicit drugs  Current medications and supplements including opioid prescriptions. Functional ability and status Nutritional status Physical activity Advanced directives List of other physicians Hospitalizations,  surgeries, and ER visits in previous 12 months Vitals Screenings to include cognitive, depression, and falls Referrals and appointments  No orders of the defined types were placed in this encounter.  In addition, I have reviewed and discussed with patient certain preventive protocols, quality metrics, and best practice recommendations. A written personalized care plan for preventive services as well as general preventive health recommendations were provided to patient.   Roz LOISE Fuller, LPN   01/15/7972   Return in 1 year (on 06/23/2025).  After Visit Summary: (MyChart) Due to this being a telephonic visit, the after visit summary with patients personalized plan was offered to patient via MyChart   Nurse Notes:  No voiced or noted concerns at this time HM Addressed: Vaccines addresses-patient declined.  "

## 2024-06-23 NOTE — Telephone Encounter (Signed)
 Patient's wife said patient was play golf and decided to go home. When friend left they found him at his car. Was taken to ER at [facility]. Was advised to see neurology. Last night was getting him ready for bed and he was talking delusional.   Transferred call to New Patient Referrals due to had been seen for dizziness or delusions.

## 2024-06-23 NOTE — Patient Instructions (Signed)
 Mr. Leder,  Thank you for taking the time for your Medicare Wellness Visit. I appreciate your continued commitment to your health goals. Please review the care plan we discussed, and feel free to reach out if I can assist you further.  Please note that Annual Wellness Visits do not include a physical exam. Some assessments may be limited, especially if the visit was conducted virtually. If needed, we may recommend an in-person follow-up with your provider.  Ongoing Care Seeing your primary care provider every 3 to 6 months helps us  monitor your health and provide consistent, personalized care.   Referrals If a referral was made during today's visit and you haven't received any updates within two weeks, please contact the referred provider directly to check on the status.  Recommended Screenings:  Health Maintenance  Topic Date Due   Pneumococcal Vaccine for age over 7 (1 of 2 - PCV) Never done   Zoster (Shingles) Vaccine (1 of 2) Never done   Medicare Annual Wellness Visit  12/16/2023   Flu Shot  Never done   COVID-19 Vaccine (5 - 2025-26 season) 02/15/2024   DTaP/Tdap/Td vaccine (2 - Td or Tdap) 09/11/2030   Meningitis B Vaccine  Aged Out   Hepatitis C Screening  Discontinued       06/23/2024   10:00 AM  Advanced Directives  Does Patient Have a Medical Advance Directive? Yes  Type of Estate Agent of [place];Living will  Does patient want to make changes to medical advance directive? No - Patient declined  Copy of Healthcare Power of Attorney in Chart? Yes - validated most recent copy scanned in chart (See row information)    Vision: Annual vision screenings are recommended for early detection of glaucoma, cataracts, and diabetic retinopathy. These exams can also reveal signs of chronic conditions such as diabetes and high blood pressure.  Dental: Annual dental screenings help detect early signs of oral cancer, gum disease, and other conditions linked to  overall health, including heart disease and diabetes.  Please see the attached documents for additional preventive care recommendations.

## 2024-06-30 NOTE — Telephone Encounter (Signed)
 I called pts wife.  If relayed that was following up on message from her about pt.  Message was sent to referrals (hospital follow up).  She relayed what happened to pt.  Was checked out and had pneumonia, flu.  Still on abx now.  He has not had anymore delusions.  Has appt in 10/2024 and is on waitlist.  Hopefully cancellation and can move him up.  He will see pcp soon for follow up.  Appreciated callback.

## 2024-07-20 ENCOUNTER — Ambulatory Visit: Admitting: Neurology

## 2024-07-20 ENCOUNTER — Encounter: Payer: Self-pay | Admitting: Neurology

## 2024-07-20 VITALS — BP 129/80 | HR 73 | Ht 66.0 in | Wt 160.5 lb

## 2024-07-20 DIAGNOSIS — I998 Other disorder of circulatory system: Secondary | ICD-10-CM

## 2024-07-20 DIAGNOSIS — R9082 White matter disease, unspecified: Secondary | ICD-10-CM | POA: Diagnosis not present

## 2024-07-20 DIAGNOSIS — M545 Low back pain, unspecified: Secondary | ICD-10-CM

## 2024-07-20 DIAGNOSIS — G8929 Other chronic pain: Secondary | ICD-10-CM

## 2024-07-20 DIAGNOSIS — R42 Dizziness and giddiness: Secondary | ICD-10-CM | POA: Diagnosis not present

## 2024-07-20 DIAGNOSIS — G250 Essential tremor: Secondary | ICD-10-CM | POA: Diagnosis not present

## 2024-07-20 DIAGNOSIS — R269 Unspecified abnormalities of gait and mobility: Secondary | ICD-10-CM | POA: Diagnosis not present

## 2024-07-20 NOTE — Progress Notes (Signed)
 "  [facility]  PATIENT: Keith Cunningham DOB: 02/25/1941  REFERRING DOCTOR OR PCP:  Dr. Beuford SOURCE: Patient, notes from orthopedics, imaging reports, MRI images personally reviewed.  _________________________________   HISTORICAL  CHIEF COMPLAINT:  Chief Complaint  Patient presents with   Hospitalization Follow-up    Rm11, wife present, Hospital follow up    HISTORY OF PRESENT ILLNESS:  Keith Cunningham is a 84 y.o. man with leg numbness and weakness reduced gait.  UPDATE 07/20/2024: He was playing golf one morning.  At the first tee, he noted his swing was off.   By the third hole he felt weaker.    At the 9th hole, after putting, he bet over to pik up his ball and told his friends he was going to quit for the day.   He headed to the parking lot but was confused and he was just standing by the car when friends saw him.   He felt dizzy.  He sat down but does not recall much at the time.  He became less responsive to others and the ambulance was called.  His next memory is being in the hospital getting tests done.     At the ED (06/22/2024)  he had a CTA showing moderate left intradural vertebral artery stenosis but no  occlusion or severe stenosis,   MRI show no acute findings.   He was found to have influenza A.   He did not have respiratory symptoms.  He did not have a fluid and felt he was well hydrated.   BP was 125/97.   He went home the next day.   His wife notes he was very hard to arouse the next day after he went to sleep.    He has been back to baseline since the following day.     EKG showed sinus tachycardia ad LAFD  From 10/22/2023 He has had stable gait difficulties and tremor since 2020 and worsening back pain over the last year.   Before 2024, he would only get occasional back and neck pain.  He plays golf and notes in March 2024 when he started playing again he had more lower back pain and some difficulty.    He began to note issues standing up  straight after he moved over to get the all.  He was doing even worse this year and he had multiple spinal injections (ESI's).   These have not helped.    He also has had trigger point injections.  Unfortunately, none of these procedures has given him significant benefit.  Looking at the notes, it does not appear as though he has had medial branch blocks or facet joint injections  Currently, he has tingling in both legs (more than in feet).  He has pain in his lower back.   He gets a lot of back spasms.   He reports the worst pain is in the lower back (points to L5-S2).  He feels mildly weaker since the pain worsened.    In 2014, after CABG, he had the onset of vertigo,, right hearing loss, left arm weakness and right visual loss while in the hospital.  He reports that gait has been poor since that time though this has not prevented him from doing tasks such as playing golf.  On 3 different occasions, I asked both his wife and him if he felt that his current gait has significantly change since he began to experience more pain and he reports that it  is essentially stable since his CABG in 2014.  He does note that when the back is hurting more his gait does worse.SABRA  He notes that the vertigo was worse when he was in the hospital in 2014 but has been stable over the past few years and occurs most of the time when he stands up from a seated position and then quickly improves.  Once up, he feels his balance is better.  The reduced hearing on the right started in 2014 and has progressed.  The left arm weakness he experienced while in the hospital resolved.  Vision is stable.  He reports bladder function is stable over the past few years.  He has nocturia once a night and no incontinence.   Imaging personally reviewed: MRI of the cervical spine 10/17/2023 (not officially read yet) by my read shows: The visible brain and cervicomedullary junction appears normal.  The spinal cord has a normal caliber and has normal  signal.  Mild disc bulging at C2-C3 causing mild foraminal narrowing on the left but no spinal stenosis or nerve root compression.  C3-C4: There is minimal disc bulging but no spinal stenosis or nerve root compression.  At C4-C5: There is minimal disc bulging causing mild foraminal narrowing but no spinal stenosis or nerve root compression.  At C5-C6, there is disc bulging, ligamenta flava hypertrophy and mild facet hypertrophy causing mild left foraminal narrowing and mild spinal stenosis.  No nerve root compression.  At C6-C7: There is disc bulging and left uncovertebral spurring causing left foraminal narrowing and borderline spinal stenosis.  No nerve root compression.    MRI lumbar spine shows L4L5 disc bulging, facet hypertrophy and mild spinal stenosis.  He has bilateral lateral recess stenosis that might affect the L5 nerve roots.    MRI of the brain 02/03/2019  with and without contrast shows the following: 1.   Mild generalized cortical atrophy and mild to moderate chronic microvascular ischemic change.  Both of these have mildly progressed when compared to the 2014 MRI. 2.   Mild chronic ethmoid sinusitis. 3.   The 7th/8th nerves and mastoid air cells appear normal.   4.   There are no acute findings and there is a normal enhancement pattern.  REVIEW OF SYSTEMS: Constitutional: No fevers, chills, sweats, or change in appetite Eyes: He has right visual loss  ear, nose and throat: He has right hearing loss.  No ear pain, nasal congestion, sore throat Cardiovascular: No chest pain, palpitations.  He has coronary artery disease, status post CABG Respiratory:  No shortness of breath at rest or with exertion.   No wheezes GastrointestinaI: No nausea, vomiting, diarrhea, abdominal pain, fecal incontinence Genitourinary:  No dysuria, urinary retention or frequency.  No nocturia. Musculoskeletal: As above Integumentary: No rash, pruritus, skin lesions Neurological: as above Psychiatric: No  depression at this time.  No anxiety Endocrine: No palpitations, diaphoresis, change in appetite, change in weigh or increased thirst Hematologic/Lymphatic:  No anemia, purpura, petechiae. Allergic/Immunologic: No itchy/runny eyes, nasal congestion, recent allergic reactions, rashes  ALLERGIES: Allergies  Allergen Reactions   Atorvastatin  Nausea And Vomiting and Other (See Comments)    CONSTIPATION FATIGUE   Doxycycline  Rash   Statins Other (See Comments)    Myalgias    Codeine Nausea And Vomiting    HOME MEDICATIONS:  Current Outpatient Medications:    acetaminophen  (TYLENOL ) 500 MG tablet, Take 500 mg by mouth every 6 (six) hours as needed for moderate pain., Disp: , Rfl:  aspirin  81 MG EC tablet, Take 1 tablet by mouth daily. 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    Staph infection 03/2017   had I and D of infected abscess     PAST SURGICAL HISTORY: Past Surgical History:  Procedure Laterality Date   CARDIAC CATHETERIZATION  04/21/2012   CATARACT  EXTRACTION, BILATERAL Bilateral    CHOLECYSTECTOMY N/A 05/23/2021   Procedure: LAPAROSCOPIC CHOLECYSTECTOMY;  Surgeon: Dasie Leonor CROME, MD;  Location: [facility] OR;  Service: General;  Laterality: N/A;   CORONARY ARTERY BYPASS GRAFT  05/10/2012   Procedure: CORONARY ARTERY BYPASS GRAFTING (CABG);  Surgeon: Dallas KATHEE Jude, MD;  Location[facility];  Service: Open Heart Surgery;  Laterality: N/A;  times three using Left Greater Saphenous Vein Graft harvested endoscopically and Left Internal Mammary Artery   EXCISIONAL HEMORRHOIDECTOMY  2012   tried to shrink 1st w/needle; cut them out 2 wk later (05/06/2012)   EYE SURGERY     INCISION AND DRAINAGE ABSCESS Right 03/25/2017   Procedure: INCISION AND DRAINAGE ABSCESS;  Surgeon: Curvin Deward MOULD, MD;  Location: [facility] ORS;  Service: General;  Laterality: Right;   INTRAOPERATIVE TRANSESOPHAGEAL ECHOCARDIOGRAM  05/10/2012   Procedure: INTRAOPERATIVE TRANSESOPHAGEAL ECHOCARDIOGRAM;  Surgeon: Dallas KATHEE Jude, MD;  Location[facility];  Service: Open Heart Surgery;  Laterality: N/A;   MELANOMA EXCISION  2009   left side of head (05/06/2012)   TONSILLECTOMY AND ADENOIDECTOMY  ~ 1950   TOTAL HIP ARTHROPLASTY Left 05/26/2016   Procedure: TOTAL HIP ARTHROPLASTY ANTERIOR APPROACH;  Surgeon: Norleen Gavel, MD;  Location: [facility] OR;  Service: Orthopedics;  Laterality: Left;   TOTAL HIP ARTHROPLASTY Right 06/25/2018   Procedure: TOTAL HIP ARTHROPLASTY ANTERIOR APPROACH;  Surgeon: Gavel Norleen, MD;  Location: [facility] ORS;  Service: Orthopedics;  Laterality: Right;    FAMILY HISTORY: Family History  Problem Relation Age of Onset   Diabetes Mellitus II Mother    Heart disease Mother    Stroke Mother    Heart attack Father    Diabetes Sister    Alcohol abuse Sister    Neuropathy Neg Hx     SOCIAL HISTORY: Social History   Socioeconomic History   Marital status: Married    Spouse name: Not on file   Number of children: Not on file   Years of education: Not on file    Highest  education level: Not on file  Occupational History   Occupation: retired    Comment: airline pilot  Tobacco Use   Smoking status: Never   Smokeless tobacco: Never  Vaping Use   Vaping status: Never Used  Substance and Sexual Activity   Alcohol use: No   Drug use: No   Sexual activity: Yes  Other Topics Concern   Not on file  Social History Narrative   Lives with wife   Caffeine use: Tea: 2 glasses per day (8 oz)   Coffee: every morning    Right handed    Retired    Presenter, Broadcasting: naval architect   Social Drivers of Health   Tobacco Use: Low Risk (07/20/2024)   Patient History    Smoking Tobacco Use: Never    Smokeless Tobacco Use: Never    Passive Exposure: Not on file  Financial Resource Strain: Low Risk (12/16/2022)   Overall Financial Resource Strain (CARDIA)    Difficulty of Paying Living Expenses: Not hard at all  Food Insecurity: No Food Insecurity (06/23/2024)   Epic    Worried About Radiation Protection Practitioner of Food in the Last Year: Never true    Ran Out of Food in the Last Year: Never true  Transportation Needs: No Transportation Needs (06/23/2024)   Epic    Lack of Transportation (Medical): No    Lack of Transportation (Non-Medical): No  Physical Activity: Sufficiently Active (06/23/2024)   Exercise Vital Sign    Days of Exercise per Week: 5 days    Minutes of Exercise per Session: 30 min  Stress: No Stress Concern Present (06/23/2024)   [employer] of Occupational Health - Occupational Stress Questionnaire    Feeling of Stress: Not at all  Social Connections: Moderately Isolated (06/23/2024)   Social Connection and Isolation Panel    Frequency of Communication with Friends and Family: Twice a week    Frequency of Social Gatherings with Friends and Family: Twice a week    Attends Religious Services: Never    Database Administrator or Organizations: No    Attends Banker Meetings: Never    Marital Status: Married  Catering Manager Violence: Not At Risk (06/23/2024)    Epic    Fear of Current or Ex-Partner: No    Emotionally Abused: No    Physically Abused: No    Sexually Abused: No  Depression (PHQ2-9): Low Risk (06/23/2024)   Depression (PHQ2-9)    PHQ-2 Score: 0  Alcohol Screen: Low Risk (12/16/2022)   Alcohol Screen    Last Alcohol Screening Score (AUDIT): 0  Housing: Low Risk (06/23/2024)   Epic    Unable to Pay for Housing in the Last Year: No    Number of Times Moved in the Last Year: 0    Homeless in the Last Year: No  Utilities: Not At Risk (06/23/2024)   Epic    Threatened with loss of utilities: No  Health Literacy: Adequate Health Literacy (06/23/2024)   B1300 Health Literacy    Frequency of need for help with medical instructions: Never       PHYSICAL EXAM  Vitals:   07/20/24 1502  BP: 129/80  Pulse: 73  Weight: 160 lb 8 oz (72.8 kg)  Height: 5' 6 (1.676 m)    Body mass index is 25.91 kg/m.   General: The patient is well-developed and well-nourished and in no acute distress  HEENT:  Head is [place]/AT.  Sclera are anicteric.     Neck: No carotid bruits  are noted.  The neck is nontender.  Cardiovascular: The heart has a regular rate and rhythm with a normal S1 and S2. There were no murmurs, gallops or rubs.    Skin: Extremities are without rash or  edema.  Musculoskeletal:  Back is nontender  Neurologic Exam  Mental status: The patient is alert and oriented x 3 at the time of the examination. The patient has apparent normal recent and remote memory, with an apparently normal attention span and concentration ability.   Speech is normal.  Cranial nerves: Extraocular movements are full There is good facial sensation to soft touch bilaterally.Facial strength is normal.  Trapezius and sternocleidomastoid strength is normal. No dysarthria is noted.  The tongue is midline, and the patient has symmetric elevation of the soft palate. No obvious hearing deficits are noted.  Motor: Very mild intention tremor predominantly in the hands,  left greater than right.  Muscle bulk is normal.   Tone is normal..  Specifically no cogwheeling.  Strength is  5 / 5 in all 4 extremities.   Sensory: Sensory testing is intact to pinprick, soft touch and vibration sensation in all 4 extremities.  Coordination: Cerebellar testing reveals good finger-nose-finger and heel-to-shin bilaterally.  Gait and station: Station is unsteady when he first stands up and then improves after about 15 seconds.   Gait is unsteady, worse with his initial steps and then improving.   He has a widened stance but reasonably good stride.  He turns in 5 steps.  Only minimal retropulsion.  Poor tandem. Romberg is negative.   Reflexes: Deep tendon reflexes are symmetric and normal bilaterally.        DIAGNOSTIC DATA (LABS, IMAGING, TESTING) - I reviewed patient records, labs, notes, testing and imaging myself where available.  Lab Results  Component Value Date   WBC 9.3 06/22/2024   HGB 14.6 06/22/2024   HCT 42.8 06/22/2024   MCV 95.5 06/22/2024   PLT 163 06/22/2024      Component Value Date/Time   NA 139 06/22/2024 1710   NA 143 02/01/2024 1148   K 4.1 06/22/2024 1710   CL 105 06/22/2024 1710   CO2 21 (L) 06/22/2024 1710   GLUCOSE 111 (H) 06/22/2024 1710   BUN 14 06/22/2024 1710   BUN 10 02/01/2024 1148   CREATININE 0.94 06/22/2024 1710   CALCIUM  9.2 06/22/2024 1710   PROT 6.5 06/22/2024 1710   PROT 6.4 02/01/2024 1148   ALBUMIN  4.2 06/22/2024 1710   ALBUMIN  4.3 02/01/2024 1148   AST 49 (H) 06/22/2024 1710   ALT 28 06/22/2024 1710   ALKPHOS 69 06/22/2024 1710   BILITOT 0.8 06/22/2024 1710   BILITOT 0.5 02/01/2024 1148   GFRNONAA >60 06/22/2024 1710   GFRAA >60 06/17/2018 0821   Lab Results  Component Value Date   CHOL 147 09/18/2022   HDL 57 09/18/2022   LDLCALC 65 09/18/2022   TRIG 148 09/18/2022   CHOLHDL 2.6 09/18/2022   Lab Results  Component Value Date   HGBA1C 5.7 (H) 08/06/2021   Lab Results  Component Value Date    VITAMINB12 756 04/22/2012   Lab Results  Component Value Date   TSH 2.54 07/09/2017       ASSESSMENT AND PLAN  Gait disturbance  Essential tremor  Chronic midline low back pain without sciatica  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   Advised to stay active.  We discussed that if balance worsens he should use a cane or walker He will return to see us  as needed for new or worsening neurologic symptoms.   Olyvia Gopal A. Vear, MD, [facility] 07/20/2024, 3:35 PM Certified in Neurology, Clinical Neurophysiology, Sleep Medicine and Neuroimaging  [facility] [address], [address] [place], [place] [address] [phone] "

## 2024-11-02 ENCOUNTER — Inpatient Hospital Stay: Admitting: Neurology
